# Patient Record
Sex: Male | Born: 1945 | Race: White | Hispanic: No | Marital: Married | Smoking: Former smoker
Health system: Southern US, Community
[De-identification: ages and names within clinical notes are randomized; demographics above are authoritative.]

## PROBLEM LIST (undated history)

## (undated) DIAGNOSIS — Z923 Personal history of irradiation: Secondary | ICD-10-CM

## (undated) DIAGNOSIS — R490 Dysphonia: Secondary | ICD-10-CM

## (undated) DIAGNOSIS — I10 Essential (primary) hypertension: Secondary | ICD-10-CM

## (undated) DIAGNOSIS — F411 Generalized anxiety disorder: Secondary | ICD-10-CM

## (undated) DIAGNOSIS — K2960 Other gastritis without bleeding: Secondary | ICD-10-CM

## (undated) DIAGNOSIS — J4489 Other specified chronic obstructive pulmonary disease: Secondary | ICD-10-CM

## (undated) DIAGNOSIS — C320 Malignant neoplasm of glottis: Secondary | ICD-10-CM

## (undated) DIAGNOSIS — F329 Major depressive disorder, single episode, unspecified: Secondary | ICD-10-CM

## (undated) DIAGNOSIS — K219 Gastro-esophageal reflux disease without esophagitis: Secondary | ICD-10-CM

## (undated) DIAGNOSIS — F3289 Other specified depressive episodes: Secondary | ICD-10-CM

## (undated) DIAGNOSIS — E039 Hypothyroidism, unspecified: Secondary | ICD-10-CM

## (undated) DIAGNOSIS — Z720 Tobacco use: Secondary | ICD-10-CM

## (undated) DIAGNOSIS — J449 Chronic obstructive pulmonary disease, unspecified: Secondary | ICD-10-CM

## (undated) DIAGNOSIS — M810 Age-related osteoporosis without current pathological fracture: Secondary | ICD-10-CM

## (undated) DIAGNOSIS — M1990 Unspecified osteoarthritis, unspecified site: Secondary | ICD-10-CM

## (undated) HISTORY — DX: Hypothyroidism, unspecified: E03.9

## (undated) HISTORY — DX: Age-related osteoporosis without current pathological fracture: M81.0

## (undated) HISTORY — DX: Other specified depressive episodes: F32.89

## (undated) HISTORY — DX: Other specified chronic obstructive pulmonary disease: J44.89

## (undated) HISTORY — PX: TONSILLECTOMY: SUR1361

## (undated) HISTORY — DX: Generalized anxiety disorder: F41.1

## (undated) HISTORY — DX: Major depressive disorder, single episode, unspecified: F32.9

## (undated) HISTORY — PX: SHOULDER SURGERY: SHX246

## (undated) HISTORY — DX: Chronic obstructive pulmonary disease, unspecified: J44.9

---

## 1969-12-01 HISTORY — PX: ABDOMINAL SURGERY: SHX537

## 1999-11-04 ENCOUNTER — Encounter: Payer: Self-pay | Admitting: Family Medicine

## 1999-11-04 ENCOUNTER — Ambulatory Visit (HOSPITAL_COMMUNITY): Admission: RE | Admit: 1999-11-04 | Discharge: 1999-11-04 | Payer: Self-pay | Admitting: Family Medicine

## 2000-05-07 ENCOUNTER — Ambulatory Visit (HOSPITAL_COMMUNITY): Admission: RE | Admit: 2000-05-07 | Discharge: 2000-05-07 | Payer: Self-pay | Admitting: Family Medicine

## 2000-05-07 ENCOUNTER — Encounter: Payer: Self-pay | Admitting: Family Medicine

## 2000-06-10 ENCOUNTER — Encounter: Payer: Self-pay | Admitting: Family Medicine

## 2000-06-10 ENCOUNTER — Ambulatory Visit (HOSPITAL_BASED_OUTPATIENT_CLINIC_OR_DEPARTMENT_OTHER): Admission: RE | Admit: 2000-06-10 | Discharge: 2000-06-10 | Payer: Self-pay | Admitting: *Deleted

## 2000-06-10 ENCOUNTER — Ambulatory Visit (HOSPITAL_COMMUNITY): Admission: RE | Admit: 2000-06-10 | Discharge: 2000-06-10 | Payer: Self-pay | Admitting: Family Medicine

## 2000-07-22 ENCOUNTER — Ambulatory Visit (HOSPITAL_BASED_OUTPATIENT_CLINIC_OR_DEPARTMENT_OTHER): Admission: RE | Admit: 2000-07-22 | Discharge: 2000-07-22 | Payer: Self-pay | Admitting: *Deleted

## 2000-09-10 ENCOUNTER — Ambulatory Visit (HOSPITAL_COMMUNITY): Admission: RE | Admit: 2000-09-10 | Discharge: 2000-09-10 | Payer: Self-pay | Admitting: Family Medicine

## 2000-09-10 ENCOUNTER — Encounter: Payer: Self-pay | Admitting: Family Medicine

## 2002-06-06 ENCOUNTER — Encounter: Payer: Self-pay | Admitting: Family Medicine

## 2002-06-06 ENCOUNTER — Ambulatory Visit (HOSPITAL_COMMUNITY): Admission: RE | Admit: 2002-06-06 | Discharge: 2002-06-06 | Payer: Self-pay | Admitting: Family Medicine

## 2002-12-28 ENCOUNTER — Encounter: Payer: Self-pay | Admitting: Orthopedic Surgery

## 2002-12-28 ENCOUNTER — Encounter: Admission: RE | Admit: 2002-12-28 | Discharge: 2002-12-28 | Payer: Self-pay | Admitting: Orthopedic Surgery

## 2003-03-20 ENCOUNTER — Ambulatory Visit (HOSPITAL_COMMUNITY): Admission: RE | Admit: 2003-03-20 | Discharge: 2003-03-20 | Payer: Self-pay | Admitting: Family Medicine

## 2003-03-20 ENCOUNTER — Encounter: Payer: Self-pay | Admitting: Family Medicine

## 2003-07-13 ENCOUNTER — Encounter (INDEPENDENT_AMBULATORY_CARE_PROVIDER_SITE_OTHER): Payer: Self-pay | Admitting: Specialist

## 2003-07-13 ENCOUNTER — Ambulatory Visit (HOSPITAL_COMMUNITY): Admission: RE | Admit: 2003-07-13 | Discharge: 2003-07-13 | Payer: Self-pay | Admitting: Gastroenterology

## 2003-08-17 ENCOUNTER — Ambulatory Visit (HOSPITAL_BASED_OUTPATIENT_CLINIC_OR_DEPARTMENT_OTHER): Admission: RE | Admit: 2003-08-17 | Discharge: 2003-08-17 | Payer: Self-pay | Admitting: Surgery

## 2003-08-17 ENCOUNTER — Encounter (INDEPENDENT_AMBULATORY_CARE_PROVIDER_SITE_OTHER): Payer: Self-pay | Admitting: *Deleted

## 2005-01-27 ENCOUNTER — Ambulatory Visit: Payer: Self-pay | Admitting: Critical Care Medicine

## 2005-02-28 ENCOUNTER — Encounter: Admission: RE | Admit: 2005-02-28 | Discharge: 2005-02-28 | Payer: Self-pay | Admitting: Rheumatology

## 2005-03-20 ENCOUNTER — Ambulatory Visit: Payer: Self-pay | Admitting: Critical Care Medicine

## 2005-03-21 ENCOUNTER — Ambulatory Visit: Payer: Self-pay | Admitting: Critical Care Medicine

## 2005-03-21 ENCOUNTER — Encounter (INDEPENDENT_AMBULATORY_CARE_PROVIDER_SITE_OTHER): Payer: Self-pay | Admitting: Specialist

## 2005-03-21 ENCOUNTER — Ambulatory Visit: Admission: RE | Admit: 2005-03-21 | Discharge: 2005-03-21 | Payer: Self-pay | Admitting: Critical Care Medicine

## 2005-03-21 ENCOUNTER — Encounter (INDEPENDENT_AMBULATORY_CARE_PROVIDER_SITE_OTHER): Payer: Self-pay | Admitting: *Deleted

## 2005-03-28 ENCOUNTER — Ambulatory Visit (HOSPITAL_COMMUNITY): Admission: RE | Admit: 2005-03-28 | Discharge: 2005-03-28 | Payer: Self-pay | Admitting: Critical Care Medicine

## 2005-03-28 ENCOUNTER — Ambulatory Visit: Payer: Self-pay | Admitting: Critical Care Medicine

## 2005-10-07 ENCOUNTER — Ambulatory Visit: Payer: Self-pay | Admitting: Critical Care Medicine

## 2005-11-10 ENCOUNTER — Emergency Department (HOSPITAL_COMMUNITY): Admission: EM | Admit: 2005-11-10 | Discharge: 2005-11-10 | Payer: Self-pay | Admitting: Emergency Medicine

## 2005-11-14 ENCOUNTER — Ambulatory Visit: Payer: Self-pay | Admitting: Critical Care Medicine

## 2005-11-20 ENCOUNTER — Ambulatory Visit: Payer: Self-pay | Admitting: Pulmonary Disease

## 2005-12-05 ENCOUNTER — Ambulatory Visit: Payer: Self-pay | Admitting: Critical Care Medicine

## 2005-12-09 ENCOUNTER — Ambulatory Visit: Payer: Self-pay | Admitting: Cardiology

## 2006-01-08 ENCOUNTER — Ambulatory Visit: Payer: Self-pay | Admitting: Critical Care Medicine

## 2006-01-26 ENCOUNTER — Observation Stay (HOSPITAL_COMMUNITY): Admission: EM | Admit: 2006-01-26 | Discharge: 2006-01-27 | Payer: Self-pay | Admitting: Emergency Medicine

## 2006-04-01 ENCOUNTER — Ambulatory Visit: Payer: Self-pay | Admitting: Critical Care Medicine

## 2006-07-07 ENCOUNTER — Ambulatory Visit: Payer: Self-pay | Admitting: Critical Care Medicine

## 2006-08-07 ENCOUNTER — Ambulatory Visit: Payer: Self-pay | Admitting: Internal Medicine

## 2006-08-27 ENCOUNTER — Ambulatory Visit: Payer: Self-pay | Admitting: Critical Care Medicine

## 2006-09-10 ENCOUNTER — Ambulatory Visit: Payer: Self-pay | Admitting: Critical Care Medicine

## 2006-10-08 ENCOUNTER — Ambulatory Visit: Payer: Self-pay | Admitting: Critical Care Medicine

## 2007-01-28 ENCOUNTER — Ambulatory Visit: Payer: Self-pay | Admitting: Critical Care Medicine

## 2007-09-09 ENCOUNTER — Ambulatory Visit: Payer: Self-pay | Admitting: Critical Care Medicine

## 2007-09-16 DIAGNOSIS — J440 Chronic obstructive pulmonary disease with acute lower respiratory infection: Secondary | ICD-10-CM

## 2007-09-16 DIAGNOSIS — F411 Generalized anxiety disorder: Secondary | ICD-10-CM | POA: Insufficient documentation

## 2007-09-16 DIAGNOSIS — F329 Major depressive disorder, single episode, unspecified: Secondary | ICD-10-CM

## 2007-09-16 DIAGNOSIS — R911 Solitary pulmonary nodule: Secondary | ICD-10-CM | POA: Insufficient documentation

## 2007-09-16 DIAGNOSIS — E039 Hypothyroidism, unspecified: Secondary | ICD-10-CM

## 2007-09-16 DIAGNOSIS — F3289 Other specified depressive episodes: Secondary | ICD-10-CM | POA: Insufficient documentation

## 2008-02-16 ENCOUNTER — Ambulatory Visit: Payer: Self-pay | Admitting: Critical Care Medicine

## 2008-04-18 ENCOUNTER — Encounter: Payer: Self-pay | Admitting: Critical Care Medicine

## 2008-08-17 ENCOUNTER — Ambulatory Visit: Payer: Self-pay | Admitting: Critical Care Medicine

## 2008-10-17 ENCOUNTER — Encounter: Payer: Self-pay | Admitting: Critical Care Medicine

## 2008-12-26 ENCOUNTER — Ambulatory Visit: Payer: Self-pay | Admitting: Critical Care Medicine

## 2008-12-27 ENCOUNTER — Telehealth (INDEPENDENT_AMBULATORY_CARE_PROVIDER_SITE_OTHER): Payer: Self-pay | Admitting: *Deleted

## 2008-12-28 ENCOUNTER — Encounter: Payer: Self-pay | Admitting: Critical Care Medicine

## 2009-01-02 ENCOUNTER — Ambulatory Visit: Payer: Self-pay | Admitting: Critical Care Medicine

## 2009-01-04 ENCOUNTER — Encounter: Payer: Self-pay | Admitting: Critical Care Medicine

## 2009-01-23 ENCOUNTER — Encounter: Payer: Self-pay | Admitting: Critical Care Medicine

## 2009-07-17 ENCOUNTER — Ambulatory Visit: Payer: Self-pay | Admitting: Critical Care Medicine

## 2009-10-01 ENCOUNTER — Ambulatory Visit: Payer: Self-pay | Admitting: Critical Care Medicine

## 2009-11-07 ENCOUNTER — Ambulatory Visit: Payer: Self-pay | Admitting: Critical Care Medicine

## 2010-09-12 ENCOUNTER — Ambulatory Visit: Payer: Self-pay | Admitting: Diagnostic Radiology

## 2010-09-12 ENCOUNTER — Ambulatory Visit: Payer: Self-pay | Admitting: Critical Care Medicine

## 2010-09-12 ENCOUNTER — Ambulatory Visit (HOSPITAL_BASED_OUTPATIENT_CLINIC_OR_DEPARTMENT_OTHER)
Admission: RE | Admit: 2010-09-12 | Discharge: 2010-09-12 | Payer: Self-pay | Source: Home / Self Care | Admitting: Critical Care Medicine

## 2010-10-22 ENCOUNTER — Ambulatory Visit: Payer: Self-pay | Admitting: Critical Care Medicine

## 2010-10-22 DIAGNOSIS — J309 Allergic rhinitis, unspecified: Secondary | ICD-10-CM | POA: Insufficient documentation

## 2010-12-03 ENCOUNTER — Ambulatory Visit
Admission: RE | Admit: 2010-12-03 | Discharge: 2010-12-03 | Payer: Self-pay | Source: Home / Self Care | Attending: Critical Care Medicine | Admitting: Critical Care Medicine

## 2010-12-10 ENCOUNTER — Telehealth (INDEPENDENT_AMBULATORY_CARE_PROVIDER_SITE_OTHER): Payer: Self-pay | Admitting: *Deleted

## 2010-12-31 NOTE — Assessment & Plan Note (Signed)
Summary: Pulmonary OV   Copy to:  Candace smith Primary Provider/Referring Provider:  Dr. Merri Brunette  CC:  Acute Visit.  difficulty breathing "all the time" but worse when doing activity and after laying down.  Sxs getting progressively worse all summer.  Also having left side chest tightness and achy pain and left side back pain x 1 month. .  History of Present Illness: 64yo WM follow-up of bronchitis and COPD.  Moderate copd/AB  9/17  last ov 3/09 Since last OV dyspnea is the same.  Some wheezing.  No mucous.  No chest pain. Overall is at baseline.   December 26, 2008-- The pt notes more dyspnea the  last few months.  There is   no cough,  there is some chest tightness. Left side feels tight.  No mucous production.  No f/c/s.  No change in weight and no edema.  July 17, 2009--  Dyspnea is improved.  Can walk any distance.  Not using ventolin inhaler.  Unsure if symbicort or spiriva have helped. PFTs were normal.  October 01, 2009--Presents for work in visit. Complains of chest congestion, dry cough, night sweats x5days. OTC not working. Denies chest pain, dyspnea, orthopnea, hemoptysis, fever, n/v/d, edema, headache,recent travel or antibiotics. Increased use of albuterol.   November 07, 2009 9:44 AM Had URI 11/1.  The pt saw the NP but did not fill the zpak and pt improved on his own. No cough.  Dypsne is at baseline.  No chest pain.   Pt denies any significant sore throat, nasal congestion or excess secretions, fever, chills, sweats, unintended weight loss, pleurtic or exertional chest pain, orthopnea PND, or leg swelling Pt denies any increase in rescue therapy over baseline, denies waking up needing it or having any early am or nocturnal exacerbations of coughing/wheezing/or dyspnea. There are not any alleviating or precipitating factors noted.  The symptoms do not generally fluctuate. September 12, 2010 9:00 AM Noting more dyspneic.  Notes more discomfort in chest and back area.   Notes pain in back area.  No real cough.  Notes some wheezing. Not much pndrip.  Noting changes over the summer time.  Current Medications (verified): 1)  Levothyroxine Sodium 150 Mcg Tabs (Levothyroxine Sodium) .... Take 1 Tablet By Mouth Once A Day 2)  Lexapro 20 Mg Tabs (Escitalopram Oxalate) .... Take 1 Tablet By Mouth Once A Day 3)  Hydrocodone-Acetaminophen 5-325 Mg  Tabs (Hydrocodone-Acetaminophen) .[name] Kitchen.. 1 By Mouth Four Times Daily 4)  Ventolin Hfa 108 (90 Base) Mcg/act Aers (Albuterol Sulfate) .... Inhale 2 Puffs Every Four Hours As Needed  Allergies (verified): 1)  ! Bidex (Guaifenesin)  Past History:  Past medical, surgical, family and social histories (including risk factors) reviewed, and no changes noted (except as noted below).  Past Medical History: Reviewed history from 12/26/2008 and no changes required. Anxiety COPD  -FeV1  87% 2008 Depression Hypothyroidism  Past Surgical History: Reviewed history from 07/17/2009 and no changes required. shoulder surgery 6-7 yrs ago abdominal surgery 25 yrs ago  Past Pulmonary History:  Pulmonary History: Pulmonary Function Test  Date: 01/02/2009 Height (in.): 75 Gender: Male  Pre-Spirometry  FVC     Value: 5.56 L/min   Pred: 5.26 L/min     % Pred: 106 % FEV1     Value: 3.77 L     Pred: 3.59 L     % Pred: 105 % FEV1/FVC   Value: 68 %     Pred: 68 %  FEF 25-75   Value: 2.16 L/min   Pred: 3.16 L/min     % Pred: 68 %  Post-Spirometry  FVC     Value: 5.42 L/min   Pred: 5.26 L/min     % Pred: 103 % FEV1     Value: 3.91 L     Pred: 3.59 L     % Pred: 109 % FEV1/FVC   Value: 72 %     Pred: 68 %     FEF 25-75   Value: 2.83 L/min   Pred: 3.16 L/min     % Pred: 90 %  Lung Volumes  TLC     Value: 8.91 L   % Pred: 114 % RV     Value: 3.36 L   % Pred: 121 % DLCO     Value: 26.6 %   % Pred: 99 % DLCO/VA   Value: 3.86 %   % Pred: 105 %  Family History: Reviewed history from 07/17/2009 and no changes  required. cancer-father-prostate, lung allergies-sisters asthma-self heart disease- grandfther  Social History: Reviewed history from 02/16/2008 and no changes required. Patient states former smoker.  Quit in 2003.  2 ppd x 43 yrs.  Review of Systems       The patient complains of shortness of breath with activity.  The patient denies shortness of breath at rest, productive cough, non-productive cough, coughing up blood, chest pain, irregular heartbeats, acid heartburn, indigestion, loss of appetite, weight change, abdominal pain, difficulty swallowing, sore throat, tooth/dental problems, headaches, nasal congestion/difficulty breathing through nose, sneezing, itching, ear ache, anxiety, depression, hand/feet swelling, joint stiffness or pain, rash, change in color of mucus, and fever.    Vital Signs:  Patient profile:   65 year old male Height:      75 inches Weight:      207 pounds BMI:     25.97 O2 Sat:      99 % on Room air Temp:     98.1 degrees F oral Pulse rate:   69 / minute BP sitting:   140 / 88  (left arm) Cuff size:   regular  Vitals Entered By: Gweneth Dimitri RN (September 12, 2010 8:49 AM)  Nutrition Counseling: Patient's BMI is greater than 25 and therefore counseled on weight management options.  O2 Flow:  Room air CC: Acute Visit.  difficulty breathing "all the time" but worse when doing activity and after laying down.  Sxs getting progressively worse all summer.  Also having left side chest tightness and achy pain and left side back pain x 1 month.   Does patient need assistance? Functional Status Cook/clean Comments Medications reviewed with patient Daytime contact number verified with patient. Crystal Jones RN  September 12, 2010 8:49 AM    Physical Exam  Additional Exam:  Gen: WD WN    WM      in NAD    NCAT Heent:  no jvd, no TMG, no cervical LNademopathy, orophyx clear,  nares with clear watery drainage. Cor: RRR nl s1/s2  no s3/s4  no m r h g Abd: soft  NT BSA   no masses  No HSM  no rebound or guarding Ext perfused with no c v e v.d Neuro: intact, moves all 4s, CN II-XII intact, DTRs intact Chest:distant BS Skin: clear  Genital/Rectal :deferred    CXR  Procedure date:  09/12/2010  Findings:      IMPRESSION: Hyperinflation consistent with COPD and/or asthma.  Stable noncalcified nodule posteriorly in  the left lower lobe dating back to the January, 2007 CT.  Calcified granuloma in the right middle lobe.  No acute cardiopulmonary disease.    Pulmonary Function Test Date: 09/12/2010 Gender: Male  Pre-Spirometry FVC    Value: 4.97 L/min   Pred: 5.21 L/min     % Pred: 95 % FEV1    Value: 3.33 L     Pred: 3.53 L     % Pred: 94 % FEV1/FVC  Value: 67 %     Pred: 99 %    FEF 25-75  Value: 2.43 L/min   Pred: 3.07 L/min     % Pred: 79 %  Comments: Mild obstruction with reduced FeV1/FVC ratio and FEF 25-75  Impression & Recommendations:  Problem # 1:  COPD (ICD-496) Assessment Deteriorated acute tracheobronchitis Note cxr neg plan Prednisone 10mg  4 each am x3days, 3 x 3days, 2 x 3days, 1 x 3days then stop Start Advair 115/21 two puff two times a day  Doxycycline one twice daily for 7days Pneumovax today Return one month [facility]  Medications Added to Medication List This Visit: 1)  Prednisone 10 Mg Tabs (Prednisone) .... Take as directed 4 each am x3days, 3 x 3days, 2 x 3days, 1 x 3days then stop 2)  Advair Hfa 115-21 Mcg/act Aero (Fluticasone-salmeterol) .... Two puffs twice daily 3)  Doxycycline Monohydrate 100 Mg Caps (Doxycycline monohydrate) .... By mouth twice daily  Complete Medication List: 1)  Levothyroxine Sodium 150 Mcg Tabs (Levothyroxine sodium) .... Take 1 tablet by mouth once a day 2)  Lexapro 20 Mg Tabs (Escitalopram oxalate) .... Take 1 tablet by mouth once a day 3)  Hydrocodone-acetaminophen 5-325 Mg Tabs (Hydrocodone-acetaminophen) .[name] Kitchen.. 1 by mouth four times daily 4)  Ventolin Hfa 108 (90 Base)  Mcg/act Aers (Albuterol sulfate) .... Inhale 2 puffs every four hours as needed 5)  Prednisone 10 Mg Tabs (Prednisone) .... Take as directed 4 each am x3days, 3 x 3days, 2 x 3days, 1 x 3days then stop 6)  Advair Hfa 115-21 Mcg/act Aero (Fluticasone-salmeterol) .... Two puffs twice daily 7)  Doxycycline Monohydrate 100 Mg Caps (Doxycycline monohydrate) .... By mouth twice daily  Other Orders: Spirometry w/Graph (94010) T-2 View CXR (71020TC) Est. Patient Level IV (60454) HFA Instruction [phone]) Prescription Created Electronically [phone]) Pneumococcal Vaccine (29562) Admin 1st Vaccine (13086)  Patient Instructions: 1)  Prednisone 10mg  4 each am x3days, 3 x 3days, 2 x 3days, 1 x 3days then stop 2)  Start Advair 115/21 two puff two times a day  3)  Doxycycline one twice daily for 7days 4)  Pneumovax today 5)  Return one month [facility]  Prescriptions: DOXYCYCLINE MONOHYDRATE 100 MG  CAPS (DOXYCYCLINE MONOHYDRATE) By mouth twice daily  #14 x 0   Entered and Authorized by:   Storm Frisk MD   Signed by:   Storm Frisk MD on 09/12/2010   Method used:   [facility]        [facility]  [address] [id number]       [address]       [place], [place]  [address]       Ph: [phone] or [phone]       Fax: [phone]   RxID:   [id number] ADVAIR HFA 115-21 MCG/ACT  AERO (FLUTICASONE-SALMETEROL) Two puffs twice daily  #1 x 6   Entered and Authorized by:   Storm Frisk MD   Signed by:   Luisa Hart  Vivi Martens MD on 09/12/2010   Method used:   [facility]        [facility]  [address] [id number]       [address]       [place], [place]  [address]       Ph: [phone] or [phone]       Fax: [phone]   RxID:   [id number] PREDNISONE 10 MG  TABS (PREDNISONE) Take as directed 4 each am x3days, 3 x 3days, 2 x 3days, 1 x 3days then stop  #30 x 0   Entered and Authorized by:   Storm Frisk MD   Signed by:    Storm Frisk MD on 09/12/2010   Method used:   [facility]        [facility]  [id number]       [address]       [place], [place]  [address]       Ph: [phone] or [phone]       Fax: [phone]   RxID:   [id number]    Immunization History:  Influenza Immunization History:    Influenza:  historical (08/30/2010)  Immunizations Administered:  Pneumonia Vaccine:    Vaccine Type: Pneumovax (Medicare)    Site: right deltoid    Mfr: Merck    Dose: 0.5 ml    Route: IM    Given by: Kandice Hams CMA    Exp. Date: 12/18/2011    Lot #: [id number]    VIS given: 11/05/09 version given September 12, 2010.  Not Administered:    Meningococcal Vaccine not given   Prevention & Chronic Care Immunizations   Influenza vaccine: Historical  (08/30/2010)    Tetanus booster: Not documented    Pneumococcal vaccine: Pneumovax (Medicare)  (09/12/2010)    H. zoster vaccine: Not documented  Colorectal Screening   Hemoccult: Not documented    Colonoscopy: Not documented  Other Screening   PSA: Not documented   Smoking status: quit > 6 months  (11/07/2009)  Lipids   Total Cholesterol: Not documented   LDL: Not documented   LDL Direct: Not documented   HDL: Not documented   Triglycerides: Not documented   Nursing Instructions: Give Pneumovax today

## 2010-12-31 NOTE — Assessment & Plan Note (Signed)
Summary: Pulmonary OV   Copy to:  Candace smith Primary Provider/Referring Provider:  Dr. Merri [facility]  CC:  1 month follow up.  Pt states breathing did get better for a "very shot time" but sxs have starting to get worse again.  Wheezing, chest tightness, and increased SOB with exertion.  Denies cough..  History of Present Illness: 64yo WM follow-up of bronchitis and COPD.  Moderate copd/AB  September 12, 2010 9:00 AM Noting more dyspneic.  Notes more discomfort in chest and back area.  Notes pain in back area.  No real cough.  Notes some wheezing. Not much pndrip.  Noting changes over the summer time.  October 22, 2010 3:47 PM Pt notes symptoms better then worse again. pred helped but advair not holding the patient No mucus.  Issue is more dyspnea.  Still with chest pain, this went away and has returned Notes some wheezing.   Current Medications (verified): 1)  Levothyroxine Sodium 150 Mcg Tabs (Levothyroxine Sodium) .... Take 1 Tablet By Mouth Once A Day 2)  Lexapro 20 Mg Tabs (Escitalopram Oxalate) .... Take 1 Tablet By Mouth Once A Day 3)  Hydrocodone-Acetaminophen 5-325 Mg  Tabs (Hydrocodone-Acetaminophen) .[name] Kitchen.. 1 By Mouth Four Times Daily 4)  Ventolin Hfa 108 (90 Base) Mcg/act Aers (Albuterol Sulfate) .... Inhale 2 Puffs Every Four Hours As Needed 5)  Advair Hfa 115-21 Mcg/act  Aero (Fluticasone-Salmeterol) .... Two Puffs Twice Daily  Allergies (verified): 1)  ! Bidex (Guaifenesin)  Past History:  Past medical, surgical, family and social histories (including risk factors) reviewed, and no changes noted (except as noted below).  Past Medical History: Reviewed history from 12/26/2008 and no changes required. Anxiety COPD  -FeV1  87% 2008 Depression Hypothyroidism  Past Surgical History: Reviewed history from 07/17/2009 and no changes required. shoulder surgery 6-7 yrs ago abdominal surgery 25 yrs ago  Past Pulmonary History:  Pulmonary History: Pulmonary  Function Test  Date: 01/02/2009 Height (in.): 75 Gender: Male  Pre-Spirometry  FVC     Value: 5.56 L/min   Pred: 5.26 L/min     % Pred: 106 % FEV1     Value: 3.77 L     Pred: 3.59 L     % Pred: 105 % FEV1/FVC   Value: 68 %     Pred: 68 %     FEF 25-75   Value: 2.16 L/min   Pred: 3.16 L/min     % Pred: 68 %  Post-Spirometry  FVC     Value: 5.42 L/min   Pred: 5.26 L/min     % Pred: 103 % FEV1     Value: 3.91 L     Pred: 3.59 L     % Pred: 109 % FEV1/FVC   Value: 72 %     Pred: 68 %     FEF 25-75   Value: 2.83 L/min   Pred: 3.16 L/min     % Pred: 90 %  Lung Volumes  TLC     Value: 8.91 L   % Pred: 114 % RV     Value: 3.36 L   % Pred: 121 % DLCO     Value: 26.6 %   % Pred: 99 % DLCO/VA   Value: 3.86 %   % Pred: 105 %  Family History: Reviewed history from 07/17/2009 and no changes required. cancer-father-prostate, lung allergies-sisters asthma-self heart disease- grandfther  Social History: Reviewed history from 09/12/2010 and no changes required. Patient states former smoker.  Quit in 2003.  2 ppd x 43 yrs.  Review of Systems       The patient complains of shortness of breath with activity, shortness of breath at rest, productive cough, and non-productive cough.  The patient denies coughing up blood, chest pain, irregular heartbeats, acid heartburn, indigestion, loss of appetite, weight change, abdominal pain, difficulty swallowing, sore throat, tooth/dental problems, headaches, nasal congestion/difficulty breathing through nose, sneezing, itching, ear ache, anxiety, depression, hand/feet swelling, joint stiffness or pain, rash, change in color of mucus, and fever.    Vital Signs:  Patient profile:   65 year old male Height:      75 inches Weight:      213 pounds BMI:     26.72 O2 Sat:      97 % on Room air Temp:     98.0 degrees F oral Pulse rate:   71 / minute BP sitting:   138 / 86  (right arm) Cuff size:   regular  Vitals Entered By: Gweneth Dimitri RN (October 22, 2010 3:34 PM)  O2 Flow:  Room air CC: 1 month follow up.  Pt states breathing did get better for a "very shot time" but sxs have starting to get worse again.  Wheezing, chest tightness, increased SOB with exertion.  Denies cough. Comments Medications reviewed with patient Daytime contact number verified with patient. Gweneth Dimitri RN  October 22, 2010 3:33 PM    Physical Exam  Additional Exam:  Gen: WD WN    WM      in NAD    NCAT Heent:  no jvd, no TMG, no cervical LNademopathy, orophyx clear,  nares with clear watery drainage. Cor: RRR nl s1/s2  no s3/s4  no m r h g Abd: soft NT BSA   no masses  No HSM  no rebound or guarding Ext perfused with no c v e v.d Neuro: intact, moves all 4s, CN II-XII intact, DTRs intact Chest:distant BS, exp wheezes Skin: clear  Genital/Rectal :deferred    Impression & Recommendations:  Problem # 1:  COPD (ICD-496) Assessment Deteriorated copd with recurrent exacerbations HFA technique excellent plan cont hfa advair repulse pred ?atopic feature >>>check RAST IgE assay  Medications Added to Medication List This Visit: 1)  Prednisone 10 Mg Tabs (Prednisone) .... Take as directed 4 each am x3days, 3 x 3days, 2 x 3days, 1 x 3days then stop  Complete Medication List: 1)  Levothyroxine Sodium 150 Mcg Tabs (Levothyroxine sodium) .... Take 1 tablet by mouth once a day 2)  Lexapro 20 Mg Tabs (Escitalopram oxalate) .... Take 1 tablet by mouth once a day 3)  Hydrocodone-acetaminophen 5-325 Mg Tabs (Hydrocodone-acetaminophen) .[name] Kitchen.. 1 by mouth four times daily 4)  Ventolin Hfa 108 (90 Base) Mcg/act Aers (Albuterol sulfate) .... Inhale 2 puffs every four hours as needed 5)  Advair Hfa 115-21 Mcg/act Aero (Fluticasone-salmeterol) .... Two puffs twice daily 6)  Prednisone 10 Mg Tabs (Prednisone) .... Take as directed 4 each am x3days, 3 x 3days, 2 x 3days, 1 x 3days then stop  Other Orders: Est. Patient Level IV ([address]) T-"RAST" (Allergy Full Profile) IGE  (60454-[address]11)  Patient Instructions: 1)  Re pulse prednisone 10mg  4 each am x3days, 3 x 3days, 2 x 3days, 1 x 3days then stop 2)  Stay on Advair twice daily 3)  Labs today, I will call with results 4)  Return one month Prescriptions: PREDNISONE 10 MG  TABS (PREDNISONE) Take as directed 4 each am x3days, 3 x 3days, 2 x  3days, 1 x 3days then stop  #30 x 0   Entered and Authorized by:   Storm Frisk MD   Signed by:   Storm Frisk MD on 10/22/2010   Method used:   [facility]        [facility]  [id number]       [address]       [place], [place]  [address]       Ph: [phone] or [phone]       Fax: [phone]   RxID:   [id number]     Appended Document: Pulmonary OV fax [facility] Katrinka Bl[facility]

## 2011-01-02 NOTE — Progress Notes (Signed)
Summary: medication  Phone Note Call from Patient Call back at Home Phone [phone]   Caller: spouse/[name] Call For: Dr. Delford Field Summary of Call: Patients wife [name] phoned [name] was given Doxycyline last week and it makes him sick/nausea and he has not been taking it. They would like to know if [facility] [address] They can be reached at [phone] Initial call taken by: Vedia Coffer,  December 10, 2010 8:56 AM  Follow-up for Phone Call        Spoke with pt.  He was started on doxy 100 mg two times a day at last ov on 12/03/09- he states that he is unable to take med, tried taking this x 4 days and each day was very nauseated.  I asked if he took with meals and he states he did not, but does not think it would have made a difference.  He is still wheezing and has prod cough with green sputum.  He would like another abx called in since could not tolerate doxy. Pls advise thanks Follow-up by: Vernie Murders,  December 10, 2010 9:15 AM  Additional Follow-up for Phone Call Additional follow up Details #1::        rx avelox 400mg  /d x 5days Additional Follow-up by: Storm Frisk MD,  December 10, 2010 9:23 AM    Additional Follow-up for Phone Call Additional follow up Details #2::    Avelox sent to pharm.  Spoke with pt and notified. Follow-up by: Vernie Murders,  December 10, 2010 9:35 AM  New/Updated Medications: AVELOX 400 MG TABS (MOXIFLOXACIN HCL) 1 by mouth once daily until gone Prescriptions: AVELOX 400 MG TABS (MOXIFLOXACIN HCL) 1 by mouth once daily until gone  #5 x 0   Entered by:   Vernie Murders   Authorized by:   Storm Frisk MD   Signed by:   Vernie Murders on 12/10/2010   Method used:   [facility]  [address] [id number]       [address]       [place], [place]  [address]       Ph: [phone] or [phone]       Fax: [phone]   RxID:   [id number]

## 2011-01-02 NOTE — Assessment & Plan Note (Signed)
Summary: Pulmonary OV   Copy to:  Candace smith Primary Provider/Referring Provider:  Dr. Merri Brunette  CC:  1 month follow up.  head and chest congestion, increased difficulty breathing all the time, a lot of chest tightness, some wheezing, and cough x 3 wks but gotten worse x 4-5 days.  Cough is prod at times with greenish yellow mucus..  History of Present Illness: 65yo WM follow-up of bronchitis and COPD.  Moderate copd/AB  September 12, 2010 9:00 AM Noting more dyspneic.  Notes more discomfort in chest and back area.  Notes pain in back area.  No real cough.  Notes some wheezing. Not much pndrip.  Noting changes over the summer time.  October 22, 2010 3:47 PM Pt notes symptoms better then worse again. pred helped but advair not holding the patient No mucus.  Issue is more dyspnea.  Still with chest pain, this went away and has returned Notes some wheezing.   December 03, 2010 4:28 PM Got a cold a few weeks ago and would not go away and now several days of dyspnea. Not coughing up much btu when does is green yellow.  Nose runs.  Dyspnea with exertion Will note some wheezing.  No fever.  No sweats Pred pulse helped in October and November.  Current Medications (verified): 1)  Levothyroxine Sodium 150 Mcg Tabs (Levothyroxine Sodium) .... Take 1 Tablet By Mouth Once A Day 2)  Lexapro 20 Mg Tabs (Escitalopram Oxalate) .... Take 1 Tablet By Mouth Once A Day 3)  Hydrocodone-Acetaminophen 5-325 Mg  Tabs (Hydrocodone-Acetaminophen) .[name] Kitchen.. 1 By Mouth Four Times Daily 4)  Ventolin Hfa 108 (90 Base) Mcg/act Aers (Albuterol Sulfate) .... Inhale 2 Puffs Every Four Hours As Needed 5)  Advair Hfa 115-21 Mcg/act  Aero (Fluticasone-Salmeterol) .... Two Puffs Twice Daily  Allergies (verified): 1)  ! Bidex (Guaifenesin)  Past History:  Past medical, surgical, family and social histories (including risk factors) reviewed, and no changes noted (except as noted below).  Past Medical  History: Reviewed history from 12/26/2008 and no changes required. Anxiety COPD  -FeV1  87% 2008 Depression Hypothyroidism  Past Surgical History: Reviewed history from 07/17/2009 and no changes required. shoulder surgery 6-7 yrs ago abdominal surgery 25 yrs ago  Past Pulmonary History:  Pulmonary History: Pulmonary Function Test  Date: 01/02/2009 Height (in.): 75 Gender: Male  Pre-Spirometry  FVC     Value: 5.56 L/min   Pred: 5.26 L/min     % Pred: 106 % FEV1     Value: 3.77 L     Pred: 3.59 L     % Pred: 105 % FEV1/FVC   Value: 68 %     Pred: 68 %     FEF 25-75   Value: 2.16 L/min   Pred: 3.16 L/min     % Pred: 68 %  Post-Spirometry  FVC     Value: 5.42 L/min   Pred: 5.26 L/min     % Pred: 103 % FEV1     Value: 3.91 L     Pred: 3.59 L     % Pred: 109 % FEV1/FVC   Value: 72 %     Pred: 68 %     FEF 25-75   Value: 2.83 L/min   Pred: 3.16 L/min     % Pred: 90 %  Lung Volumes  TLC     Value: 8.91 L   % Pred: 114 % RV     Value: 3.36 L   %  Pred: 121 % DLCO     Value: 26.6 %   % Pred: 99 % DLCO/VA   Value: 3.86 %   % Pred: 105 %  Family History: Reviewed history from 07/17/2009 and no changes required. cancer-father-prostate, lung allergies-sisters asthma-self heart disease- grandfther  Social History: Reviewed history from 09/12/2010 and no changes required. Patient states former smoker.  Quit in 2003.  2 ppd x 43 yrs.  Review of Systems       The patient complains of shortness of breath with activity and productive cough.  The patient denies shortness of breath at rest, non-productive cough, coughing up blood, chest pain, irregular heartbeats, acid heartburn, indigestion, loss of appetite, weight change, abdominal pain, difficulty swallowing, sore throat, tooth/dental problems, headaches, nasal congestion/difficulty breathing through nose, sneezing, itching, ear ache, anxiety, depression, hand/feet swelling, joint stiffness or pain, rash, change in color of mucus, and  fever.    Vital Signs:  Patient profile:   65 year old male Height:      75 inches Weight:      214.13 pounds BMI:     26.86 O2 Sat:      96 % on Room air Temp:     98.0 degrees F oral Pulse rate:   86 / minute BP sitting:   126 / 80  (right arm) Cuff size:   regular  Vitals Entered By: Gweneth Dimitri RN (December 03, 2010 4:11 PM)  O2 Flow:  Room air CC: 1 month follow up.  head and chest congestion, increased difficulty breathing all the time, a lot of chest tightness, some wheezing, cough x 3 wks but gotten worse x 4-5 days.  Cough is prod at times with greenish yellow mucus. Comments Medications reviewed with patient Daytime contact number verified with patient. Crystal Jones RN  December 03, 2010 4:11 PM    Physical Exam  Additional Exam:  Gen: WD WN    WM      in NAD    NCAT Heent:  no jvd, no TMG, no cervical LNademopathy, orophyx clear,  nares with clear watery drainage. Cor: RRR nl s1/s2  no s3/s4  no m r h g Abd: soft NT BSA   no masses  No HSM  no rebound or guarding Ext perfused with no c v e v.d Neuro: intact, moves all 4s, CN II-XII intact, DTRs intact Chest:distant BS, exp wheezes Skin: clear  Genital/Rectal :deferred    Impression & Recommendations:  Problem # 1:  ACUTE BRONCHITIS (ICD-466.0) Assessment Deteriorated recurrent tracheobronchitis plan add qvar pulse pred cont advair His updated medication list for this problem includes:    Ventolin Hfa 108 (90 Base) Mcg/act Aers (Albuterol sulfate) ..... Inhale 2 puffs every four hours as needed    Advair Hfa 115-21 Mcg/act Aero (Fluticasone-salmeterol) .[name] Kitchen..[name] Kitchen Two puffs twice daily    Doxycycline Monohydrate 100 Mg Caps (Doxycycline monohydrate) ..... By mouth twice daily    Qvar 40 Mcg/act Aers (Beclomethasone dipropionate) .[name] Kitchen..[name] Kitchen Two puffs twice daily  Orders: Est. Patient Level V (14782)  Medications Added to Medication List This Visit: 1)  Prednisone 10 Mg Tabs (Prednisone) .... Take as directed 4  each am x3days, 3 x 3days, 2 x 3days, 1 x 3days then stop 2)  Doxycycline Monohydrate 100 Mg Caps (Doxycycline monohydrate) .... By mouth twice daily 3)  Qvar 40 Mcg/act Aers (Beclomethasone dipropionate) .... Two puffs twice daily  Complete Medication List: 1)  Levothyroxine Sodium 150 Mcg Tabs (Levothyroxine sodium) .... Take 1 tablet  by mouth once a day 2)  Lexapro 20 Mg Tabs (Escitalopram oxalate) .... Take 1 tablet by mouth once a day 3)  Hydrocodone-acetaminophen 5-325 Mg Tabs (Hydrocodone-acetaminophen) .[name] Kitchen.. 1 by mouth four times daily 4)  Ventolin Hfa 108 (90 Base) Mcg/act Aers (Albuterol sulfate) .... Inhale 2 puffs every four hours as needed 5)  Advair Hfa 115-21 Mcg/act Aero (Fluticasone-salmeterol) .... Two puffs twice daily 6)  Prednisone 10 Mg Tabs (Prednisone) .... Take as directed 4 each am x3days, 3 x 3days, 2 x 3days, 1 x 3days then stop 7)  Doxycycline Monohydrate 100 Mg Caps (Doxycycline monohydrate) .... By mouth twice daily 8)  Qvar 40 Mcg/act Aers (Beclomethasone dipropionate) .... Two puffs twice daily  Patient Instructions: 1)  Qvar two puff twice daily until samples gone 2)  Stay on Advair two puff twice daily 3)  Prednisone 10mg  4 each am x3days, 3 x 3days, 2 x 3days, 1 x 3days then stop 4)  Doxycycline one twice daily for 7days 5)  Return 6 weeks Prescriptions: DOXYCYCLINE MONOHYDRATE 100 MG  CAPS (DOXYCYCLINE MONOHYDRATE) By mouth twice daily  #14 x 0   Entered and Authorized by:   Storm Frisk MD   Signed by:   Storm Frisk MD on 12/03/2010   Method used:   Electronically to        [facility]  [address] [id number] (retail)       [address]       [place], [place]  [address]       Ph: [phone] or [phone]       Fax: [phone]   RxID:   [id number] PREDNISONE 10 MG  TABS (PREDNISONE) Take as directed 4 each am x3days, 3 x 3days, 2 x 3days, 1 x 3days then stop  #30 x 6   Entered and Authorized by:   Storm Frisk MD    Signed by:   Storm Frisk MD on 12/03/2010   Method used:   [facility]ronically to        [facility]  [address] [id number] (ret[facility]       [address]       [place], [place]  [address]       Ph: [phone] or [phone]       Fax: [phone]   RxID:   [id number]

## 2011-01-14 ENCOUNTER — Encounter: Payer: Self-pay | Admitting: Critical Care Medicine

## 2011-01-14 ENCOUNTER — Ambulatory Visit (INDEPENDENT_AMBULATORY_CARE_PROVIDER_SITE_OTHER): Payer: Medicare Other | Admitting: Critical Care Medicine

## 2011-01-14 DIAGNOSIS — J449 Chronic obstructive pulmonary disease, unspecified: Secondary | ICD-10-CM

## 2011-01-14 DIAGNOSIS — K219 Gastro-esophageal reflux disease without esophagitis: Secondary | ICD-10-CM | POA: Insufficient documentation

## 2011-01-22 NOTE — Assessment & Plan Note (Signed)
Summary: Pulmonary OV   Copy to:  Candace smith Primary Provider/Referring Provider:  Dr. Merri Brunette  CC:  1 month follow up.  Pt states breathing is about the same - still haivng SOB with exertion, wheezing, and chest tightness.  Denies cough..  History of Present Illness: 64yo WM follow-up of bronchitis and COPD.  Moderate copd/AB  September 12, 2010 9:00 AM Noting more dyspneic.  Notes more discomfort in chest and back area.  Notes pain in back area.  No real cough.  Notes some wheezing. Not much pndrip.  Noting changes over the summer time.  October 22, 2010 3:47 PM Pt notes symptoms better then worse again. pred helped but advair not holding the patient No mucus.  Issue is more dyspnea.  Still with chest pain, this went away and has returned Notes some wheezing.   December 03, 2010 4:28 PM Got a cold a few weeks ago and would not go away and now several days of dyspnea. Not coughing up much btu when does is green yellow.  Nose runs.  Dyspnea with exertion Will note some wheezing.  No fever.  No sweats Pred pulse helped in October and November.  January 14, 2011 2:14 PM Not much change from before.  The bronchitis did clear up.  Pt is still wheezing the same and dyspnea is the same.  No nasal drainage.  Now off qvar and abx.  Pt is better and on advair alone now.  Current Medications (verified): 1)  Levothyroxine Sodium 150 Mcg Tabs (Levothyroxine Sodium) .... Take 1 Tablet By Mouth Once A Day 2)  Lexapro 20 Mg Tabs (Escitalopram Oxalate) .... Take 1 Tablet By Mouth Once A Day 3)  Hydrocodone-Acetaminophen 5-325 Mg  Tabs (Hydrocodone-Acetaminophen) .[name] Kitchen.. 1 By Mouth Four Times Daily 4)  Ventolin Hfa 108 (90 Base) Mcg/act Aers (Albuterol Sulfate) .... Inhale 2 Puffs Every Four Hours As Needed 5)  Advair Hfa 115-21 Mcg/act  Aero (Fluticasone-Salmeterol) .... Two Puffs Twice Daily  Allergies (verified): 1)  ! Bidex (Guaifenesin) 2)  Doxycycline  Past History:  Past  medical, surgical, family and social histories (including risk factors) reviewed, and no changes noted (except as noted below).  Past Medical History: Reviewed history from 12/26/2008 and no changes required. Anxiety COPD  -FeV1  87% 2008 Depression Hypothyroidism  Past Surgical History: Reviewed history from 07/17/2009 and no changes required. shoulder surgery 6-7 yrs ago abdominal surgery 25 yrs ago  Past Pulmonary History:  Pulmonary History: Pulmonary Function Test  Date: 01/02/2009 Height (in.): 75 Gender: Male  Pre-Spirometry  FVC     Value: 5.56 L/min   Pred: 5.26 L/min     % Pred: 106 % FEV1     Value: 3.77 L     Pred: 3.59 L     % Pred: 105 % FEV1/FVC   Value: 68 %     Pred: 68 %     FEF 25-75   Value: 2.16 L/min   Pred: 3.16 L/min     % Pred: 68 %  Post-Spirometry  FVC     Value: 5.42 L/min   Pred: 5.26 L/min     % Pred: 103 % FEV1     Value: 3.91 L     Pred: 3.59 L     % Pred: 109 % FEV1/FVC   Value: 72 %     Pred: 68 %     FEF 25-75   Value: 2.83 L/min   Pred: 3.16 L/min     %  Pred: 90 %  Lung Volumes  TLC     Value: 8.91 L   % Pred: 114 % RV     Value: 3.36 L   % Pred: 121 % DLCO     Value: 26.6 %   % Pred: 99 % DLCO/VA   Value: 3.86 %   % Pred: 105 %  Family History: Reviewed history from 07/17/2009 and no changes required. cancer-father-prostate, lung allergies-sisters asthma-self heart disease- grandfther  Social History: Reviewed history from 09/12/2010 and no changes required. Patient states former smoker.  Quit in 2003.  2 ppd x 43 yrs.  Review of Systems       The patient complains of shortness of breath with activity, non-productive cough, and acid heartburn.  The patient denies shortness of breath at rest, productive cough, coughing up blood, chest pain, irregular heartbeats, indigestion, loss of appetite, weight change, abdominal pain, difficulty swallowing, sore throat, tooth/dental problems, headaches, nasal congestion/difficulty breathing  through nose, sneezing, itching, ear ache, anxiety, depression, hand/feet swelling, joint stiffness or pain, rash, change in color of mucus, and fever.    Vital Signs:  Patient profile:   65 year old male Height:      75 inches Weight:      214.13 pounds BMI:     26.86 O2 Sat:      96 % on Room air Temp:     98.1 degrees F oral Pulse rate:   96 / minute BP sitting:   142 / 82  (right arm) Cuff size:   large  Vitals Entered By: Gweneth Dimitri RN (January 14, 2011 2:09 PM)  O2 Flow:  Room air CC: 1 month follow up.  Pt states breathing is about the same - still haivng SOB with exertion, wheezing, chest tightness.  Denies cough. Comments Medications reviewed with patient Daytime contact number verified with patient. Crystal Jones RN  January 14, 2011 2:09 PM    Physical Exam  Additional Exam:  Gen: WD WN    WM      in NAD    NCAT Heent:  no jvd, no TMG, no cervical LNademopathy, orophyx clear,  nares with clear watery drainage. Cor: RRR nl s1/s2  no s3/s4  no m r h g Abd: soft NT BSA   no masses  No HSM  no rebound or guarding Ext perfused with no c v e v.d Neuro: intact, moves all 4s, CN II-XII intact, DTRs intact Chest:distant BS, exp wheezes Skin: clear  Genital/Rectal :deferred    Impression & Recommendations:  Problem # 1:  COPD (ICD-496) Assessment Unchanged  copd with recurrent exacerbations ppt by GERD, now improved, RAST assay normal  HFA technique excellent plan cont hfa advair  follow gerd diet  Problem # 2:  GERD (ICD-530.81) Assessment: Unchanged cont zantac and reflux diet His updated medication list for this problem includes:    Zantac 150 Mg Tabs (Ranitidine hcl) .[name] Kitchen... Take 1-2 by mouth daily  Orders: Est. Patient Level III (19147)  Medications Added to Medication List This Visit: 1)  Zantac 150 Mg Tabs (Ranitidine hcl) .... Take 1-2 by mouth daily  Complete Medication List: 1)  Levothyroxine Sodium 150 Mcg Tabs (Levothyroxine sodium) ....  Take 1 tablet by mouth once a day 2)  Lexapro 20 Mg Tabs (Escitalopram oxalate) .... Take 1 tablet by mouth once a day 3)  Hydrocodone-acetaminophen 5-325 Mg Tabs (Hydrocodone-acetaminophen) .[name] Kitchen.. 1 by mouth four times daily 4)  Ventolin Hfa 108 (90 Base) Mcg/act Aers (Albuterol sulfate) .[name] KitchenMarland KitchenMarland Kitchen  Inhale 2 puffs every four hours as needed 5)  Advair Hfa 115-21 Mcg/act Aero (Fluticasone-salmeterol) .... Two puffs twice daily 6)  Zantac 150 Mg Tabs (Ranitidine hcl) .... Take 1-2 by mouth daily  Patient Instructions: 1)  Follow reflux diet 2)  No change in medications 3)  Return in     4     months  Appended Document: Pulmonary OV fax candace Katrinka Blazing

## 2011-04-15 NOTE — Assessment & Plan Note (Signed)
[facility]                             PULMONARY OFFICE NOTE   NAME:STONEDenham, Mose                       MRN:          [id number]  DATE:09/09/2007                            DOB:          [birth year]    Mr. Wyndham is a 65 year old white male history of chronic obstructive  lung disease, asthmatic bronchitic components, emphysematous components.  He is here today for followup, doing well on Symbicort 2 sprays b.i.d.  160/4.5 and Spiriva daily.  He is having no active cough, mucus  production, or chest tightness and is doing otherwise well.   EXAM:  Temp 98, blood pressure 130/84, pulse 58, saturation 98% on room  air.  CHEST:  Diminished breath sounds, prolonged inspiratory phase.  No  wheeze, rale, or rhonchi.  CARDIAC EXAM:  Regular rate and rhythm without S3.  Normal S1, S2.  ABDOMEN:  Soft, nontender.  EXTREMITIES:  No edema, clubbing, or venous disease.   IMPRESSION:  Asthmatic bronchitis with chronic obstructive lung disease  stable at this time.   Plan for this patient is to maintain inhaled medicines as currently  dosed, and we will see the patient back in followup.  A flu vaccine was  administered.     Charlcie Cradle Delford Field, MD, [facility]  Electronically Signed    PEW/MedQ  DD: 09/09/2007  DT: 09/09/2007  Job #: [id number]

## 2011-04-18 NOTE — Op Note (Signed)
   NAME:  [name], [name]                          ACCOUNT NO.:  [id number]   MEDICAL RECORD NO.:  [id number]                   PATIENT TYPE:  AMB   LOCATION:  ENDO                                 FACILITY:  [facility]   PHYSICIAN:  Graylin Shiver, M.D.                DATE OF BIRTH:  01/01/1946   DATE OF PROCEDURE:  07/13/2003  DATE OF DISCHARGE:                                 OPERATIVE REPORT   PROCEDURE:  Colonoscopy with biopsy.   INDICATION FOR PROCEDURE:  Lower abdominal pain, diarrhea, rectal bleeding.  The patient gives a remote history of positive for Crohn's disease,  diagnosed at surgery 34 years ago.  He questions that particular diagnosis,  however.  He is undergoing colonoscopy because of the lower abdominal pain,  loose stools, and rectal bleeding.   Informed consent was obtained after explanation of the risks of bleeding,  infection, and perforation.   PREMEDICATIONS:  1. Fentanyl 75 mcg IV.  2. Versed 6 mg IV.   DESCRIPTION OF PROCEDURE:  With the patient in the left lateral decubitus  position, a rectal exam was performed, and no masses were felt.  The Olympus  colonoscope was inserted into the rectum and advanced around a tortuous  colon to the anastomosis from his prior surgery.  The anastomosis site  looked normal with no evidence of ulceration or inflammatory changes  suggesting Crohn's disease.  The scope was passed through the anastomosis  and into the small bowel, and the first few centimeters of small bowel  inspected, looked perfectly normal.  The scope was then brought out,  visualizing the rest of the colonic mucosa which looked normal in its  entirety from the anastomosis down to the rectum.  Some random biopsies were  obtained from the more proximal colon for histological inspection.  The  scope was retroflexed in the rectum.  No abnormalities were seen.  He  tolerated the procedure well without complications.   IMPRESSION:  Normal colonoscopy to the  region of the anastomosis site.  I  see nothing on this exam to suggest Crohn's disease.                                               Graylin Shiver, M.D.    Germain Osgood  D:  07/13/2003  T:  07/13/2003  Job:  [id number]   cc:   Dario Guardian, M.D.  [address]  [place]  [place] [address]  Fax: [phone]

## 2011-04-18 NOTE — Assessment & Plan Note (Signed)
[facility]                               PULMONARY OFFICE NOTE   NAME:[name]                       MRN:          [id number]  DATE:[name]                            DOB:          Apr 05, 1946    [name] returns today in followup.  This is a 65 year old white male,  history of chronic obstructive lung disease, asthmatic bronchitis.  Overall  the patient is improved, with decreased shortness of breath, decreased  cough.  He had an acute bronchitic spell in October, but now is better.   He maintains:  1. Symbicort 2 sprays b.i.d. 160/4.5.  2. Spiriva daily.   PHYSICAL EXAMINATION:  He is a well-developed, well-nourished, middle-aged  male in no distress.  Temp 98, blood pressure 130/84, pulse 80, saturation 98% on room air.  CHEST:  Showed diminished breath sounds with prolonged expiratory phase, no  wheeze or rhonchi noted.  CARDIAC:  Exam showed a regular rate and rhythm without S3, normal S1, S2.  ABDOMEN:  Soft, nontender.  EXTREMITIES:  Showed no edema or clubbing.  SKIN:  Clear.  NEUROLOGIC:  Exam was intact.  HEENT:  Exam showed no jugular venous distention, no lymphadenopathy.  The  oropharynx is clear.  NECK:  Supple.   IMPRESSION:  Chronic obstructive lung disease with asthmatic bronchitic  component.   PLAN:  Maintain inhaled medicines as currently dosed.  We will see the  patient back in return followup in 3 months.     Charlcie Cradle Delford Field, MD, [facility]  Electronically Signed    [name]  [name]  [name]  Job #: [id number]   cc:   Dario Guardian, M.D.

## 2011-04-18 NOTE — Op Note (Signed)
[facility]  Patient:    [name], [name]                       MRN: [id number] Proc. Date: 06/10/00 Adm. Date:  [id number] Disc. Date: [id number] Attending:  Melody Comas.                           Operative Report  PREOPERATIVE DIAGNOSIS:  Subcutaneous abscess left medial cheek.  POSTOPERATIVE DIAGNOSIS:  Subcutaneous abscess left medial cheek.  OPERATION:  Incision and drainage of infected epidermal inclusion cyst, left medial cheek.  SURGEON:  Janet Berlin. Dan Humphreys, M.D.  ASSISTANT:  None.  ANESTHESIA:  Local.  INDICATIONS:  [name] is a 65 year old man who was seen electively in the office and scheduled for removal of what was felt to be an epidermal inclusion cyst of the left medial cheek.  When I examined him just prior to the proposed procedure today, he had an obvious abscess at the time.  I explained to the patient it required immediate incision and drainage and that an elective resection could not safely be carried out under these circumstances. He is in agreement with I&D of the lesion.  We will plan on bringing him back electively to excise any cyst remnants once this area has healed.  DESCRIPTION OF OPERATION:  The patient was placed in the supine position on the operating table at the [facility].  I sterilely prepped the area with Hibiclens solution and used sterile towels to form a surgical field.  The lesion was very tense.  I made a small nick in the thinned out central portion of the lesion.  Purulent material was immediately expressed. This was cultured both aerobically and anaerobically.  I then infiltrated 1% Xylocaine into the soft tissues immediately surrounding the area, warning the patient that the level of anesthesia would be suboptimal due to the change in pH of the tissues from the acute inflammation.  However, we were able to get him at a reasonable level of anesthetic blockage.  I then extended  the incision along the long axis of the abscess, which roughly paralleled the nasolabial fold.  Additionally purulent exudate was allowed to escape.  I also excised some of the fibrous septa within the lesion.  Additionally, I was able to remove a lot of the ceruminous material contained within the cyst.  After widely opening it, I packed the area with iodoform gauze and placed a sterile dressing over it.  The patient is to leave the dressing in place overnight and will see me in the office early tomorrow.  At that time, the packing will be be removed and he will be started on daily dressing changes.  He was also given a prescription for oral antibiotics, as well as for an analgesic for pain relief. DD:  06/15/00 TD:  06/15/00 Job: [id number]

## 2011-04-18 NOTE — Op Note (Signed)
[place]  Patient:    [name], [name]                       MRN: [id number] Proc. Date: 07/22/00 Adm. Date:  [id number] Attending:  Melody Comas.                           Operative Report  PREOPERATIVE DIAGNOSIS:  Probable epidermal inclusion cyst, left medial cheek.  POSTOPERATIVE DIAGNOSIS:  Probable epidermal inclusion cyst, left medial cheek.  PROCEDURE PERFORMED:  Excisional biopsy of 1.5 cm subcutaneous mass of the left medial cheek.  SURGEON:  Janet Berlin. Dan Humphreys, M.D.  ANESTHESIA:  Local.  INDICATIONS:  [name] is a 65 year old man who was previously scheduled to undergo removal of a subcutaneous mass of the left medial cheek.  He presented at the time of the excision with a markedly inflamed, fluctuant mass, consistent with an acutely infected epidermal inclusion cyst.  An I&D procedure was carried out at that time, and the patient was treated with dressing changes and oral antibiotics.  He has gone on to resolve this infection, and now represents for excision of what is now a much smaller mass, but still grossly visible to casual inspection.  DESCRIPTION OF PROCEDURE:  The patient was brought back to the minor surgery operating room at the [facility], and placed on the operating room table in the supine position.  I prepped the left medial cheek with Hibiclens solution and sterilely draped the field.  Xylocaine 1% was used to infiltrate the soft tissues around the mass.  I designed a lenticular shaped excision as a continuation of the nasolabial fold on the left.  After a suitable level of anesthesia had been obtained, the lesion was excised full-thickness.  I found that I had to dissect quite deeply, medially, and laterally in order to get around the lesion.  There is a lot of chronically inflamed tissue as well.  A branch of what was probably the angularis artery was transected during the excision, and I  controlled this with small hemostats.  This was suture ligated for hemostatic control.  The wound was irrigated out, and hemostasis found to be satisfactory.  I closed the defect in layers.  Sutures were first placed at the base of the wound in order to reapproximate the muscular layers.  I then placed sutures in the subcutaneous layer; all of these were interrupted, buried 4-0 Vicryl sutures.  I then placed buried dermal sutures using the same material to reapproximated the wound margins.  Final epidermal layer closure was achieved with placement of both vertical mattress and simple running sutures of 6-0 monofilament nylon. The wound was sterilely dressed.  The patient was given wound care instructions, and will see me early next week for wound check, suture removal, and review of his final pathology report. DD:  07/22/00 TD:  07/23/00 Job: [id number]

## 2011-04-18 NOTE — Op Note (Signed)
NAME:  [name], [name]                          ACCOUNT NO.:  [id number]   MEDICAL RECORD NO.:  [id number]                   PATIENT TYPE:  AMB   LOCATION:  DSC                                  FACILITY:  [facility]   PHYSICIAN:  Currie Paris, M.D.           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   OPERATION PERFORMED:  Wound exploration of abdominal incision with excision  of scar.   SURGEON:  Currie Paris, M.D.   ANESTHESIA:  MAC.   INDICATIONS FOR PROCEDURE:  The patient has had an abdominal surgery back in  the 70s and has had a chronic area of drainage in the lower midline wound  that will occasionally stop and then redrain.  He has also in the past had  suture material work it's way out through the wound in various locations,  but none for several years.  I had suspected that there may be some residual  foreign body suture material causing this chronic inflammation.   DESCRIPTION OF PROCEDURE:  The patient was seen in the holding area and had  no further questions.  The area in question was marked although it was quite  visible as a reddened area with dried skin and crusting in the lower midline  incision.  The patient was taken to the operating room and given some IV  sedation. The area was shaved, prepped and draped.  I used a combination of  1% Xylocaine and 0.5% Marcaine with epinephrine mixed equally for local.  I  infiltrated the area and then excised with an elliptical incision, the area  of inflammatory changes of the skin and sent that for pathology.  I divided  the subcutaneous tissues down to fascia and carefully examined all of this  both visually and by palpation.  There was no evidence of any deep  wound  problem and the fascia appeared intact and I saw no suture material in the  fascia and no evidence of any inflammatory processes deeper than the  superficial subcutaneous tissue which I had already excised.  I felt fairly  comfortable there was no chronic foreign  body or chronic local infection deep and that this was most likely a  superficial issue.  The incision was then closed with some 3-0 nylon  vertical mattress interrupted sutures.  Sterile dressings were applied.  The  patient tolerated the procedure well.  There were no operative complications  and all counts were correct.  Currie Paris, M.D.    CJS/MEDQ  D:  08/17/2003  T:  08/17/2003  Job:  [id number]   cc:   Dario Guardian, M.D.  [address]  [place]  [place] [address]  Fax: [phone]

## 2011-04-18 NOTE — Op Note (Signed)
NAMESHAWN, [name]                ACCOUNT NO.:  [id number]   MEDICAL RECORD NO.:  [id number]          PATIENT TYPE:  AMB   LOCATION:  CARD                         FACILITY:  [facility]   PHYSICIAN:  Shan Levans, M.D. LHCDATE OF BIRTH:  20-Oct-1946   DATE OF PROCEDURE:  03/21/2005  DATE OF DISCHARGE:                                 OPERATIVE REPORT   CHIEF COMPLAINT:  Left lower lobe lung nodule and left upper lobe lingula  infiltrate, history of bronchiolitis obliterans organized pneumonia.   SURGEON:  Shan Levans, M.D.   ANESTHESIA:  1% Xylocaine local.   PREOP MEDICATION:  Demerol 50 mg, Versed 6 mg IV push.   DESCRIPTION OF PROCEDURE:  The Pentax video bronchoscope was introduced in  the right naris, the upper airways were visualized and revealed to be  unremarkable. The entire tracheobronchial tree was visualized and revealed  tracheobronchitis particularly in the left upper lobe lingula segment but no  endobronchial lesions were seen. Attention was then paid to the left upper  lobe, transbronchial biopsies x5 were obtained without difficulty.  Bronchioalveolar lavage 150 mL volume infused, 80 mL volume was returned.  Attention was then paid to the left lower lobe, the lateral segment was  biopsied x4. After the fourth biopsy, a 150 mL hemorrhage occurred, this was  controlled with topical epinephrine and 5000 units of topical thrombin. At  the end of the procedure, the patient had a clot that was retracting in the  left lower lobe, he was saturating 99% on 100% nonrebreather and had a blood  pressure of 140/99, pulse rate in the 80 range.   IMPRESSION:  Left lingula infiltrate with associated left lower lobe nodule,  rule out for malignancy, rule out bronchiolitis obliterans organized  pneumonia.   RECOMMENDATIONS:  Followup pathology, microbiology.      PW/MEDQ  D:  03/21/2005  T:  03/21/2005  Job:  [id number]   cc:   Dario Guardian, M.D.  [address]  [place]  [place] [address]  Fax: [phone]

## 2011-04-18 NOTE — Assessment & Plan Note (Signed)
[facility]                               PULMONARY OFFICE NOTE   NAME:[name], [place]                       MRN:          [id number]  DATE:08/27/2006                            DOB:          12-Jun-1946    [name] is a 65 year old white male, history of chronic obstructive lung  disease, asthmatic bronchitis.  Patient notes no real chest pain but is  still having chronic fatigue, chronic pain.  Is considering a fibromyalgia  specialty referral.  Patient maintains Spiriva daily, QVAR 80 mcg strength,  2 sprays b.i.d.   PHYSICAL EXAMINATION:  VITAL SIGNS:  Temp 98, blood pressure 134/80, pulse  87.  Saturation 96% on room air.  CHEST:  Diminished breath sounds with prolonged expiratory phase.  No wheeze  or rhonchi.  CARDIAC:  Regular rate and rhythm without S3.  Normal S1 and S2.  ABDOMEN:  Soft, nontender.  EXTREMITIES:  No clubbing or edema.  SKIN:  Clear.   IMPRESSION:  Chronic obstructive lung disease with asthmatic bronchitic  component.   The plan is to begin Symbicort 160/4.5 at 2 sprays b.i.d.  Discontinue  further QVAR.  Continue Spiriva daily.  Return in six weeks for a recheck.       Charlcie Cradle Delford Field, MD, [facility]      PEW/MedQ  DD:  08/27/2006  DT:  08/29/2006  Job #:  [id number]   cc:   Dario Guardian, M.D.

## 2011-04-18 NOTE — Assessment & Plan Note (Signed)
[facility]                               PULMONARY OFFICE NOTE   NAME:STONEBrain, Honeycutt                       MRN:          [id number]  DATE:09/10/2006                            DOB:          07-30-1946    Mr. Heyde is a 65 year old white male seen today as a work-in, complaining  of sore throat, cough with thick green mucus, some dyspnea, increased head  congestion, maintaining Symbicort 2 sprays b.i.d., Spiriva daily, Synthroid  137 mcg daily, Lexapro 10 mg daily, Vicodin q.i.d.   PHYSICAL EXAMINATION:  GENERAL:  This is a middle-aged male in no distress.  VITAL SIGNS:  Temp 97.8, blood pressure 126/80, pulse 76, saturation 98% on  room air.  CHEST:  Distant breath sounds.  No wheeze or rhonchi.  Relatively clear.  CARDIAC:  Regular rate and rhythm without S3.  Normal S1 and S2.  ABDOMEN:  Soft and nontender.  EXTREMITIES:  No clubbing or edema.  SKIN:  Clear.  NEUROLOGIC:  Intact.  HEENT/NECK:  No jugular venous distention, no lymphadenopathy.  Oropharynx  clear.  Neck supple.   IMPRESSION:  Chronic lung disease with asthmatic bronchitic component with  acute sinusitis.   PLAN:  Patient is to receive Avelox 400 mg a day for five days.  Maintain  inhaled medicines as currently dosed.  No change in plan of care was made.  We will see the patient back in followup in a month's time.       Charlcie Cradle Delford Field, MD, [facility]      PEW/MedQ  DD:  09/10/2006  DT:  09/12/2006  Job #:  [id number]   cc:   Dario Guardian, M.D.

## 2011-04-18 NOTE — Assessment & Plan Note (Signed)
[facility]                               PULMONARY OFFICE NOTE   NAME:STONEKervens, [name]                       MRN:          [id number]  DATE:08/07/2006                            DOB:          05/03/1946    HISTORY OF PRESENT ILLNESS:  Patient is a 65 year old white male patient  of Dr. Lynelle Doctor, who has a known history of COPD and asthmatic bronchitis,  who presents to the office complaining of a persistent cough, congestion,  and shortness of breath.  She was seen approximately one month ago for  similar symptoms and with a COPD exacerbation and treated with a seven day  course of Levaquin and prednisone taper.  Patient reports symptoms did  improve and had actually totally resolved up until four days ago.  She  denies any hemoptysis, leg swelling, chest pain, orthopnea, or overt reflux  symptoms.  A chest x-ray done last visit showed severe COPD with no acute  changes.   PAST MEDICAL HISTORY:  Reviewed.   CURRENT MEDICATIONS:  Reviewed.   PHYSICAL EXAMINATION:  GENERAL:  Patient is an elderly male in no acute  distress.  VITAL SIGNS:  She is afebrile with stable vital signs.  O2 saturation is 96%  on room air.  HEENT:  Unremarkable.  NECK:  Supple without adenopathy.  LUNGS:  Lung sounds reveal coarse rhonchi bilaterally, left greater than  right.  CARDIAC:  Regular rate.  ABDOMEN:  Soft, benign.  EXTREMITIES:  Warm without edema.   IMPRESSION/PLAN:  Acute exacerbation of chronic obstructive pulmonary  disease:  Patient is to begin Avelox for seven days.  Rechallenge with a  steroid taper, beginning at 40 mg, and decreasing by 10 mg every three days.  Patient will increase QVAR up to 80 mcg 2 puffs twice daily and was given a  Xopenex nebulizer treatment in the office.  She will recheck here in two  weeks with Dr. Delford Field or sooner if needed.                                   Rubye Oaks, NP                                Charlcie Cradle Delford Field, MD, FCCP   TP/MedQ  DD:  08/10/2006  DT:  08/10/2006  Job #:  [id number]

## 2011-04-18 NOTE — Assessment & Plan Note (Signed)
[facility]                             PULMONARY OFFICE NOTE   NAME:STONEMaynor, Mwangi                       MRN:          [id number]  DATE:01/28/2007                            DOB:          July 17, 1946    Mr. Rickman is a 65 year old white male, history of chronic obstructive  lung disease, ex-smoker since January 2002, also has a left lower lobe  noncalcified nodule we have been following.  He has no complaints today.  He states his level of dyspnea is better.  He is on Symbicort two sprays  b.i.d. 160/4.5 mcg, Spiriva one capsule daily.   PHYSICAL EXAMINATION:  VITAL SIGNS:  Temperature is 97, blood pressure  130/82, pulse 64, saturation 99% on room air.  GENERAL:  He is a middle-aged male in no distress.  CHEST:  Distant breath sounds but no evidence of wheeze or rhonchi.  CARDIAC:  A regular rate and rhythm without S3, normal S1, S2.  ABDOMEN:  Soft, nontender.  EXTREMITIES:  No edema or clubbing.  SKIN:  Clear.  NEUROLOGIC:  Intact.  HEENT:  No jugular venous distention or lymphadenopathy.  Oropharynx  clear.  NECK:  Supple.   Spirometry was obtained and showed essentially normal spirometry  although the FEF 25/75 is still reduced to 57% of predicted.   IMPRESSION:  Chronic obstructive air flow disease with stable air flow  function.   PLAN:  To maintain Spiriva and Symbicort as currently dosed.  We will  follow up another CT scan of his chest to follow up interval change and  left lower lobe nodule and return to this office in 4 months.     Charlcie Cradle Delford Field, MD, [facility]  Electronically Signed    PEW/MedQ  DD: 01/28/2007  DT: 01/28/2007  Job #: [id number]   cc:   Dario Guardian, M.D.

## 2011-04-18 NOTE — Cardiovascular Report (Signed)
[name], [name]                ACCOUNT NO.:  [id number]   MEDICAL RECORD NO.:  [id number]          PATIENT TYPE:  INP   LOCATION:  2858                         FACILITY:  [facility]   PHYSICIAN:  Armanda Magic, M.D.     DATE OF BIRTH:  [birth year]   DATE OF PROCEDURE:  01/27/2006  DATE OF DISCHARGE:  01/27/2006                              CARDIAC CATHETERIZATION   REFERRING PHYSICIAN:  Dr. Shan Levans.   PRIMARY PHYSICIAN:  Dr. Laurann Montana.   PROCEDURE:  Left heart catheterization, coronary angiography, left  ventriculography.   OPERATOR:  Armanda Magic, M.D.   INDICATIONS:  Chest pain with abnormal Cardiolite.   COMPLICATIONS:  None.   IV ACCESS:  Via right femoral artery 6-French sheath.   This is a very pleasant 65 year old white male who presented with episodes  of chest pain and had an abnormal Cardiolite which showed reversible defect  at the apex. He had recurrent chest pain yesterday in the office and I  admitted him to rule out myocardial infarction which he did by serial  enzymes and now presents for cardiac catheterization.   The patient is brought to cardiac catheterization laboratory in the fasting  nonsedated state. Informed consent was obtained. The patient was connected  to continuous heart rate, pulse oximetry monitoring and intermittent blood  pressure monitoring. The right groin was prepped and draped in the sterile  fashion. 1% Xylocaine was used for local anesthesia. Using the modified  Seldinger technique, a 6-French sheath was placed in the right femoral  artery. Under fluoroscopic guidance, a 6-French JL-4 catheter was placed in  the left coronary artery. Multiple cine films were taken at 30 degree RAO  and 40 degree LAO views. This catheter was then exchanged out over a  guidewire for 6-French JR-4 catheter which was placed under fluoroscopic  guidance in the right coronary artery. Multiple cine films were taken at 30  degree RAO and 40 degree LAO  views. This catheter was then exchanged out  over a guidewire for 6-French angled pigtail catheter which was placed under  fluoroscopic guidance in the left ventricular cavity. Left ventriculography  was performed in 30 degree RAO view using total of 30 cc of contrast at 15  cc per second. Catheter was then pulled back across the aortic valve with no  significant gradient noted. At the end of procedure, all catheters and  sheaths were removed. Manual compression was performed until adequate  hemostasis was obtained. The patient was transferred back to room in stable  condition.   RESULTS:  Left main coronary is widely patent but it is very short and  almost immediately bifurcates into a left anterior descending artery and  left circumflex artery. Left anterior descending artery is widely patent  throughout its course to the apex giving rise to two diagonal branches both  of which are widely patent.   The left circumflex is widely patent throughout its course in the AV groove.  It gives rise to a very small obtuse marginal one branch which is widely  patent. It gives rise to a second obtuse marginal  branch which is rather  large and bifurcates into two daughter branches, the superior branch is  small. The inferior branch is large and widely patent. The third obtuse  marginal branch has luminal irregularities of 20%. The left circumflex then  bifurcates distally in the posterior descending artery and fourth obtuse  marginal branch, both of which are widely patent. This is a left dominant  system.   The right coronary artery is nondominant and small but widely patent.   Left ventriculography has normal LV systolic function. EF of 55-60%. Aortic  pressure 148/79 mmHg, LV pressure 145/8 mmHg.   ASSESSMENT:  1.  Noncardiac chest pain.  2.  Normal coronary arteries.  3.  Normal left ventricular function.   PLAN:  Discharge to home after IV fluid and bedrest. Nexium 40 milligrams a  day.  Follow up with me in two weeks.      Armanda Magic, M.D.  Electronically Signed     TT/MEDQ  D:  01/27/2006  T:  01/27/2006  Job:  [id number]   cc:   Shan Levans, M.D. [facility]  [address]  [place]  [place] [address]   [place]. Cliffton Asters, M.D.  Fax: [phone]

## 2011-04-18 NOTE — Assessment & Plan Note (Signed)
[facility]                               PULMONARY OFFICE NOTE   NAME:[name], [place]                       MRN:          [id number]  DATE:07/07/2006                            DOB:          [birth year]    CHIEF COMPLAINT:  Fatigue, nausea, shortness of breath.   HISTORY OF PRESENT ILLNESS:  This is a 65 year old white male who presents  today with very vague complaints of fatigue and dyspnea.  He was seen in the  office by Dr. Merri Brunette yesterday, reporting approximately a 2-week  history of fatigue, malaise and increased shortness of breath over baseline.  He also reported increased nausea and nonspecific low back pain.  He was  initially seen by his primary care Hero Kulish with these complaints about 2  weeks ago and seen in return office visit.  After multiple diagnostic tests  were done and revealed no etiology, he was referred to see [place] in the  pulmonary office.  Upon questioning, Mr. Gaulin is very vague and difficult  to pin down on the onset of his clear symptoms and for that matter,  difficult to ascertain exactly what his chief complaint is.  He reports  generalized fatigue, malaise, and nonspecific back pain.  He has a baseline  history of urinary hesitancy and enlarged prostate, but he denies any  dysuria.  He states he has been slowly and progressively more short of  breath, now to the point where he gets winded just taking a shower.  However, he is very vague about what his baseline pulmonary status is, only  that he is much more labored at this point.  He denies fever.  He reports  occasional chills and hot flashes.  He has had occasional episodes of nausea  as well as cough when in the recumbent position, but states that it is  productive with green sputum.  He reports increased wheeze, occasional  pressure over his anterior chest, but denies any orthopnea.  He denies any  lower extremity edema, or left-sided chest discomfort.  He  has recently had  a cardiac catheterization from Dr. Armanda Magic.  He was demonstrated to  have clean coronary arteries and felt that his chest pain was noncardiac in  nature.  As indicated, he has had a history of occasional chest discomfort  and was worked up extensively by Dr. Delford Field, including a spiral CT, which  was negative for pulmonary embolus.   PAST MEDICAL HISTORY:  1.  Chronic obstructive pulmonary disease.  2.  Anxiety.  3.  Depression.  4.  Left lower lobe pulmonary nodule.   MEDICATIONS:  Synthroid, Lexapro, Vicodin, Spiriva, and Qvar.   PHYSICAL EXAMINATION:  VITAL SIGNS:  Temperature 97.3, blood pressure  120/88, pulse rate 80.  Saturation is 97%.  HEENT:  No JVD or adenopathy.  Mucous membranes are moist, no significant  posterior pharynx erythema.  LUNGS:  Breath sounds have occasional scattered rhonchi and focal left lower  lobe rales.  Chest x-ray was obtained, which demonstrated chronic scarring  of the left lower lobe, previously mentioned pulmonary nodule  in the left  lower lobe, and chronic obstructive pulmonary disease.  This was reviewed  with Dr. Delford Field.  CARDIAC:  Regular rate and rhythm.  EXTREMITIES:  No edema, brisk capillary refill.  ABDOMEN:  Soft and nontender.   IMPRESSION AND PLAN:  Probable acute exacerbation of chronic obstructive  pulmonary disease.  Plan for this is to continue his course of Levaquin as  prescribed by his primary care Maricia Scotti.  Additionally, I will begin him on  a prednisone taper, for which he will take prednisone 40 mg daily x4 days,  then decrease by 10 mg every 4 days until gone.  I have instructed should  his symptoms not improve, he would need to return to the office to see Dr.  Delford Field.  I discussed this plan of care with Dr. Delford Field prior to completion  of office visit.  The patient was informed should his symptoms worsen, to  return to the office sooner if needed.                                   Zenia Resides, NP                                Charlcie Cradle. Delford Field, MD, FCCP   PB/MedQ  DD:  07/07/2006  DT:  07/08/2006  Job #:  [id number]

## 2011-05-23 ENCOUNTER — Encounter: Payer: Self-pay | Admitting: Critical Care Medicine

## 2011-05-26 ENCOUNTER — Encounter: Payer: Self-pay | Admitting: Critical Care Medicine

## 2011-05-26 ENCOUNTER — Ambulatory Visit (INDEPENDENT_AMBULATORY_CARE_PROVIDER_SITE_OTHER): Payer: Medicare Other | Admitting: Critical Care Medicine

## 2011-05-26 VITALS — BP 136/86 | HR 56 | Temp 98.0°F | Ht 75.0 in | Wt 211.0 lb

## 2011-05-26 DIAGNOSIS — J984 Other disorders of lung: Secondary | ICD-10-CM

## 2011-05-26 DIAGNOSIS — J449 Chronic obstructive pulmonary disease, unspecified: Secondary | ICD-10-CM

## 2011-05-26 MED ORDER — PREDNISONE 10 MG PO TABS: ORAL_TABLET | ORAL | Status: DC

## 2011-05-26 MED ORDER — FLUTICASONE-SALMETEROL 115-21 MCG/ACT IN AERO: 2.0000 | INHALATION_SPRAY | Freq: Two times a day (BID) | RESPIRATORY_TRACT | Status: DC

## 2011-05-26 NOTE — Patient Instructions (Signed)
Stay on Advair two puff twice daily Take prednisone 10mg  Take 4 for three days, 3 for three days, 2 for three days, 1 for three days and stop Obtain a CT Chest to follow up the Left lung nodule, I will call with results Return 3 months

## 2011-05-26 NOTE — Assessment & Plan Note (Signed)
G[place] 0 COPD with asthmatic bronchitis associated symptoms FeV1 82%  Fef 25/75 70% 05/26/11 Pt clearly worse off Advair Plan Resume Advair 115 two puff bid, technique is excellent Prednisone pulse for 12 days

## 2011-05-26 NOTE — Progress Notes (Signed)
Subjective:    Patient ID: [name], male    DOB: 07-14-1946, 65 y.o.   MRN: [id number]  HPI 64yo WM follow-up of bronchitis and COPD. Moderate copd/AB  September 12, 2010 9:00 AM  Noting more dyspneic. Notes more discomfort in chest and back area. Notes pain in back area. No real cough. Notes some wheezing.  Not much pndrip. Noting changes over the summer time.  October 22, 2010 3:47 PM  Pt notes symptoms better then worse again. pred helped but advair not holding the patient  No mucus. Issue is more dyspnea. Still with chest pain, this went away and has returned  Notes some wheezing.  December 03, 2010 4:28 PM  Got a cold a few weeks ago and would not go away and now several days of dyspnea.  Not coughing up much btu when does is green yellow. Nose runs. Dyspnea with exertion  Will note some wheezing. No fever. No sweats  Pred pulse helped in October and November.  January 14, 2011 2:14 PM  Not much change from before. The bronchitis did clear up. Pt is still wheezing the same and dyspnea is the same. No nasal drainage. Now off qvar and abx. Pt is better and on advair alone now.   05/26/2011 Noting progressive dyspnea,  Still with wheeze and dyspnea.  Not much mucus when coughs and cough is minimal.   Not much congestion.  No additional ABX since 2/12 Pt stopped using Advair a few weeks ago and may be better..  Now using the rescue inhaler 2-3 times.   Past Medical History  Diagnosis Date  . Anxiety state, unspecified   . Chronic airway obstruction, not elsewhere classified   . Depressive disorder, not elsewhere classified   . Hypothyroidism      Family History  Problem Relation Age of Onset  . Prostate cancer Father   . Lung cancer Father   . Allergies Sister      History   Social History  . Marital Status: Married    Spouse Name: N/A    Number of Children: N/A  . Years of Education: N/A   Occupational History  . Not on file.   Social History Main Topics  .  Smoking status: Former Smoker -- 2.0 packs/day for 40 years    Types: Cigarettes    Quit date: 12/01/2001  . Smokeless tobacco: Never Used   Comment: 2ppd x 43 years  . Alcohol Use: Not on file  . Drug Use: Not on file  . Sexually Active: Not on file   Other Topics Concern  . Not on file   Social History Narrative  . No narrative on file     Allergies  Allergen Reactions  . Doxycycline     REACTION: nausea  . Guaifenesin      Outpatient Prescriptions Prior to Visit  Medication Sig Dispense Refill  . albuterol (VENTOLIN HFA) 108 (90 BASE) MCG/ACT inhaler Inhale 2 puffs into the lungs every 6 (six) hours as needed.        [name] Kitchen escitalopram (LEXAPRO) 20 MG tablet Take 20 mg by mouth daily.        [name] Kitchen HYDROcodone-acetaminophen (NORCO) 5-325 MG per tablet Take 1 tablet by mouth every 6 (six) hours as needed.        [name] Kitchen levothyroxine (SYNTHROID, LEVOTHROID) 150 MCG tablet Take 150 mcg by mouth daily.        . ranitidine (ZANTAC) 150 MG capsule Take 150 mg by mouth 2 (  two) times daily as needed.       . fluticasone-salmeterol (ADVAIR HFA) 115-21 MCG/ACT inhaler Inhale 2 puffs into the lungs 2 (two) times daily.            Review of Systems Constitutional:   No  weight loss, night sweats,  Fevers, chills, fatigue, lassitude. HEENT:   No headaches,  Difficulty swallowing,  Tooth/dental problems,  Sore throat,                No sneezing, itching, ear ache, nasal congestion, post nasal drip,   CV:  No chest pain,  Orthopnea, PND, swelling in lower extremities, anasarca, dizziness, palpitations  GI  No heartburn, indigestion, abdominal pain, nausea, vomiting, diarrhea, change in bowel habits, loss of appetite  Resp: Notes  shortness of breath with exertion and at rest.  No excess mucus, no productive cough,  No non-productive cough,  No coughing up of blood.  No change in color of mucus.  No wheezing.  No chest wall deformity  Skin: no rash or lesions.  GU: no dysuria, change in color of  urine, no urgency or frequency.  No flank pain.  MS:  No joint pain or swelling.  No decreased range of motion.  No back pain.  Psych:  No change in mood or affect. No depression or anxiety.  No memory loss.     Objective:   Physical Exam Filed Vitals:   05/26/11 1005  BP: 136/86  Pulse: 56  Temp: 98 F (36.7 C)  TempSrc: Oral  Height: 6\' 3"  (1.905 m)  Weight: 211 lb (95.709 kg)  SpO2: 98%    Gen: Pleasant, well-nourished, in no distress,  normal affect  ENT: No lesions,  mouth clear,  oropharynx clear, no postnasal drip  Neck: No JVD, no TMG, no carotid bruits  Lungs: No use of accessory muscles, no dullness to percussion, distant BS, exp wheeze  Cardiovascular: RRR, heart sounds normal, no murmur or gallops, no peripheral edema  Abdomen: soft and NT, no HSM,  BS normal  Musculoskeletal: No deformities, no cyanosis or clubbing  Neuro: alert, non focal  Skin: Warm, no lesions or rashes      PFT Conversion 01/04/2009 09/12/2010  FVC 5.56 4.97  FVC PREDICT 5.26 5.21  FVC  % Predicted 106 95  FEV1 3.77 3.33  FEV1 PREDICT 3.59 3.53  FEV % Predicted 105 94  FEV1/FVC 67.8 67  FEV1/FVC PRE 68 99  FeF 25-75 2.16 2.43  FeF 25-75 % Predicted 3.16 3.07  FEF % EXPEC 68 79  POST FVC 5.42   FVCPRDPST 5.26   POST FVC%EXP 103   POST FEV1 3.91   FEV1PRDPST 3.59   POSTFEV1%PRD 109   PSTFEV1/FVC 72   FEV1FVCPRDPS 68   PSTFEF25/75% 2.83   PSTFEF25/75P 3.16   FEF2575%EXPS 90   Residual Volume (ml) 3.36   RV % EXPECT 121   DLCO (ml/mmHg sec) 26.6   DLCO % EXPEC 99   DLCO/VA 3.86   DLCO/VA%EXP 105   PFT RSLT normal     FeV1 82%  Fef 25/75 70% 05/26/11  Assessment & Plan:   COPD Golds 0 COPD with asthmatic bronchitis associated symptoms FeV1 82%  Fef 25/75 70% 05/26/11 Pt clearly worse off Advair Plan Resume Advair 115 two puff bid, technique is excellent Prednisone pulse for 12 days     Updated Medication List Outpatient Encounter Prescriptions as of  05/26/2011  Medication Sig Dispense Refill  . albuterol (VENTOLIN HFA) 108 (90 BASE)  MCG/ACT inhaler Inhale 2 puffs into the lungs every 6 (six) hours as needed.        [name] Kitchen escitalopram (LEXAPRO) 20 MG tablet Take 20 mg by mouth daily.        [name] Kitchen HYDROcodone-acetaminophen (NORCO) 5-325 MG per tablet Take 1 tablet by mouth every 6 (six) hours as needed.        [name] Kitchen levothyroxine (SYNTHROID, LEVOTHROID) 150 MCG tablet Take 150 mcg by mouth daily.        . ranitidine (ZANTAC) 150 MG capsule Take 150 mg by mouth 2 (two) times daily as needed.       . fluticasone-salmeterol (ADVAIR HFA) 115-21 MCG/ACT inhaler Inhale 2 puffs into the lungs 2 (two) times daily.  1 Inhaler  12  . predniSONE (DELTASONE) 10 MG tablet Take 4 for three days, 3 for three days, 2 for three days, 1 for three days and stop   30 tablet  0  . DISCONTD: fluticasone-salmeterol (ADVAIR HFA) 115-21 MCG/ACT inhaler Inhale 2 puffs into the lungs 2 (two) times daily.

## 2011-05-28 ENCOUNTER — Ambulatory Visit (INDEPENDENT_AMBULATORY_CARE_PROVIDER_SITE_OTHER)
Admission: RE | Admit: 2011-05-28 | Discharge: 2011-05-28 | Disposition: A | Discharge status: Home / Self Care | Payer: Medicare Other | Source: Ambulatory Visit | Attending: Critical Care Medicine | Admitting: Critical Care Medicine

## 2011-05-28 DIAGNOSIS — J984 Other disorders of lung: Secondary | ICD-10-CM

## 2011-08-26 ENCOUNTER — Ambulatory Visit (INDEPENDENT_AMBULATORY_CARE_PROVIDER_SITE_OTHER): Payer: Medicare Other | Admitting: Critical Care Medicine

## 2011-08-26 ENCOUNTER — Encounter: Payer: Self-pay | Admitting: Critical Care Medicine

## 2011-08-26 DIAGNOSIS — J449 Chronic obstructive pulmonary disease, unspecified: Secondary | ICD-10-CM

## 2011-08-26 DIAGNOSIS — F411 Generalized anxiety disorder: Secondary | ICD-10-CM

## 2011-08-26 DIAGNOSIS — F329 Major depressive disorder, single episode, unspecified: Secondary | ICD-10-CM

## 2011-08-26 NOTE — Patient Instructions (Signed)
No change in medications. Return in        6 months        

## 2011-08-26 NOTE — Progress Notes (Signed)
Subjective:    Patient ID: [name], male    DOB: [birth year], 65 y.o.   MRN: [id number]  HPI  64yo WM follow-up of bronchitis and COPD. Moderate copd/AB  September 12, 2010 9:00 AM  Noting more dyspneic. Notes more discomfort in chest and back area. Notes pain in back area. No real cough. Notes some wheezing.  Not much pndrip. Noting changes over the summer time.  October 22, 2010 3:47 PM  Pt notes symptoms better then worse again. pred helped but advair not holding the patient  No mucus. Issue is more dyspnea. Still with chest pain, this went away and has returned  Notes some wheezing.  December 03, 2010 4:28 PM  Got a cold a few weeks ago and would not go away and now several days of dyspnea.  Not coughing up much btu when does is green yellow. Nose runs. Dyspnea with exertion  Will note some wheezing. No fever. No sweats  Pred pulse helped in October and November.  January 14, 2011 2:14 PM  Not much change from before. The bronchitis did clear up. Pt is still wheezing the same and dyspnea is the same. No nasal drainage. Now off qvar and abx. Pt is better and on advair alone now.   05/26/11 Noting progressive dyspnea,  Still with wheeze and dyspnea.  Not much mucus when coughs and cough is minimal.   Not much congestion.  No additional ABX since 2/12 Pt stopped using Advair a few weeks ago and may be better..  Now using the rescue inhaler 2-3 times.   08/26/11 Dyspnea is the same.  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  Cough is minimal and no mucus Pt denies any significant sore throat, nasal congestion or excess secretions, fever, chills, sweats, unintended weight loss, pleurtic or exertional chest pain, orthopnea PND, or leg swelling Pt denies any increase in rescue therapy over baseline, denies waking up needing it or having any early am or nocturnal exacerbations of coughing/wheezing/or dyspnea. Pt also denies any obvious fluctuation in  symptoms with  weather or environmental change or other alleviating or aggravating factors   Past Medical History  Diagnosis Date  . Anxiety state, unspecified   . Chronic airway obstruction, not elsewhere classified   . Depressive disorder, not elsewhere classified   . Hypothyroidism      Family History  Problem Relation Age of Onset  . Prostate cancer Father   . Lung cancer Father   . Allergies Sister      History   Social History  . Marital Status: Married    Spouse Name: N/A    Number of Children: N/A  . Years of Education: N/A   Occupational History  . Not on file.   Social History Main Topics  . Smoking status: Former Smoker -- 2.0 packs/day for 40 years    Types: Cigarettes    Quit date: 12/01/2001  . Smokeless tobacco: Never Used   Comment: 2ppd x 43 years  . Alcohol Use: Not on file  . Drug Use: Not on file  . Sexually Active: Not on file   Other Topics Concern  . Not on file   Social History Narrative  . No narrative on file     Allergies  Allergen Reactions  . Doxycycline     REACTION: nausea  . Guaifenesin      Outpatient Prescriptions Prior to Visit  Medication Sig Dispense Refill  . albuterol (VENTOLIN  HFA) 108 (90 BASE) MCG/ACT inhaler Inhale 2 puffs into the lungs every 6 (six) hours as needed.        [name] Kitchen escitalopram (LEXAPRO) 20 MG tablet Take 20 mg by mouth daily.        . fluticasone-salmeterol (ADVAIR HFA) 115-21 MCG/ACT inhaler Inhale 2 puffs into the lungs 2 (two) times daily.  1 Inhaler  12  . HYDROcodone-acetaminophen (NORCO) 5-325 MG per tablet Take 1 tablet by mouth every 6 (six) hours as needed.        [name] Kitchen levothyroxine (SYNTHROID, LEVOTHROID) 150 MCG tablet Take 150 mcg by mouth daily.        . ranitidine (ZANTAC) 150 MG capsule Take 150 mg by mouth 2 (two) times daily as needed.       . predniSONE (DELTASONE) 10 MG tablet Take 4 for three days, 3 for three days, 2 for three days, 1 for three days and stop   30 tablet  0       Review of Systems  Constitutional:   No  weight loss, night sweats,  Fevers, chills, fatigue, lassitude. HEENT:   No headaches,  Difficulty swallowing,  Tooth/dental problems,  Sore throat,                No sneezing, itching, ear ache, nasal congestion, post nasal drip,   CV:  No chest pain,  Orthopnea, PND, swelling in lower extremities, anasarca, dizziness, palpitations  GI  No heartburn, indigestion, abdominal pain, nausea, vomiting, diarrhea, change in bowel habits, loss of appetite  Resp: Notes  shortness of breath with exertion and at rest.  No excess mucus, no productive cough,  No non-productive cough,  No coughing up of blood.  No change in color of mucus.  No wheezing.  No chest wall deformity  Skin: no rash or lesions.  GU: no dysuria, change in color of urine, no urgency or frequency.  No flank pain.  MS:  No joint pain or swelling.  No decreased range of motion.  No back pain.  Psych:  No change in mood or affect. No depression or anxiety.  No memory loss.     Objective:   Physical Exam  Filed Vitals:   08/26/11 0945  BP: 148/88  Pulse: 53  Temp: 97.7 F (36.5 C)  TempSrc: Oral  Height: 6\' 2"  (1.88 m)  Weight: 208 lb 3.2 oz (94.439 kg)  SpO2: 99%    Gen: Pleasant, well-nourished, in no distress,  normal affect  ENT: No lesions,  mouth clear,  oropharynx clear, no postnasal drip  Neck: No JVD, no TMG, no carotid bruits  Lungs: No use of accessory muscles, no dullness to percussion, distant BS, exp wheeze  Cardiovascular: RRR, heart sounds normal, no murmur or gallops, no peripheral edema  Abdomen: soft and NT, no HSM,  BS normal  Musculoskeletal: No deformities, no cyanosis or clubbing  Neuro: alert, non focal  Skin: Warm, no lesions or rashes      PFT Conversion 01/04/2009 09/12/2010  FVC 5.56 4.97  FVC PREDICT 5.26 5.21  FVC  % Predicted 106 95  FEV1 3.77 3.33  FEV1 PREDICT 3.59 3.53  FEV % Predicted 105 94  FEV1/FVC 67.8 67   FEV1/FVC PRE 68 99  FeF 25-75 2.16 2.43  FeF 25-75 % Predicted 3.16 3.07  FEF % EXPEC 68 79  POST FVC 5.42   FVCPRDPST 5.26   POST FVC%EXP 103   POST FEV1 3.91   FEV1PRDPST 3.59   POSTFEV1%PRD 109  PSTFEV1/FVC 72   FEV1FVCPRDPS 68   PSTFEF25/75% 2.83   PSTFEF25/75P 3.16   FEF2575%EXPS 90   Residual Volume (ml) 3.36   RV % EXPECT 121   DLCO (ml/mmHg sec) 26.6   DLCO % EXPEC 99   DLCO/VA 3.86   DLCO/VA%EXP 105   PFT RSLT normal     FeV1 82%  Fef 25/75 70% 05/26/11  Assessment & Plan:   COPD chronic obstructive lung disease with asthmatic bronchitic component and reactive airways disease component Plan Continued inhaled medications as prescribed     Updated Medication List Outpatient Encounter Prescriptions as of 08/26/2011  Medication Sig Dispense Refill  . albuterol (VENTOLIN HFA) 108 (90 BASE) MCG/ACT inhaler Inhale 2 puffs into the lungs every 6 (six) hours as needed.        [name] Kitchen escitalopram (LEXAPRO) 20 MG tablet Take 20 mg by mouth daily.        . fluticasone-salmeterol (ADVAIR HFA) 115-21 MCG/ACT inhaler Inhale 2 puffs into the lungs 2 (two) times daily.  1 Inhaler  12  . HYDROcodone-acetaminophen (NORCO) 5-325 MG per tablet Take 1 tablet by mouth every 6 (six) hours as needed.        [name] Kitchen levothyroxine (SYNTHROID, LEVOTHROID) 150 MCG tablet Take 150 mcg by mouth daily.        . ranitidine (ZANTAC) 150 MG capsule Take 150 mg by mouth 2 (two) times daily as needed.       [name] Kitchen DISCONTD: predniSONE (DELTASONE) 10 MG tablet Take 4 for three days, 3 for three days, 2 for three days, 1 for three days and stop   30 tablet  0

## 2011-08-27 NOTE — Assessment & Plan Note (Addendum)
chronic obstructive lung disease with asthmatic bronchitic component and reactive airways disease component Plan Continued inhaled medications as prescribed

## 2012-02-20 ENCOUNTER — Other Ambulatory Visit: Payer: Self-pay | Admitting: Gastroenterology

## 2012-02-24 ENCOUNTER — Encounter: Payer: Self-pay | Admitting: Critical Care Medicine

## 2012-02-24 ENCOUNTER — Ambulatory Visit (INDEPENDENT_AMBULATORY_CARE_PROVIDER_SITE_OTHER): Payer: Medicare Other | Admitting: Critical Care Medicine

## 2012-02-24 VITALS — BP 150/94 | HR 65 | Temp 98.4°F | Ht 74.0 in | Wt 220.6 lb

## 2012-02-24 DIAGNOSIS — J984 Other disorders of lung: Secondary | ICD-10-CM | POA: Diagnosis not present

## 2012-02-24 DIAGNOSIS — J449 Chronic obstructive pulmonary disease, unspecified: Secondary | ICD-10-CM | POA: Diagnosis not present

## 2012-02-24 DIAGNOSIS — E291 Testicular hypofunction: Secondary | ICD-10-CM

## 2012-02-24 DIAGNOSIS — K5010 Crohn's disease of large intestine without complications: Secondary | ICD-10-CM

## 2012-02-24 NOTE — Patient Instructions (Signed)
No change in medications. Return in        6 months        

## 2012-02-24 NOTE — Assessment & Plan Note (Signed)
Gold A COPD with asthmatic bronchitic component Table at this time Plan Maintain Advair at current dose rate

## 2012-02-24 NOTE — Progress Notes (Signed)
Subjective:    Patient ID: [name], male    DOB: October 28, 1946, 66 y.o.   MRN: [id number]  HPI  66 y.o. [name]   08/26/11 Dyspnea is the same.  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  Cough is minimal and no mucus Pt denies any significant sore throat, nasal congestion or excess secretions, fever, chills, sweats, unintended weight loss, pleurtic or exertional chest pain, orthopnea PND, or leg swelling Pt denies any increase in rescue therapy over baseline, denies waking up needing it or having any early am or nocturnal exacerbations of coughing/wheezing/or dyspnea. Pt also denies any obvious fluctuation in symptoms with  weather or environmental change or other alleviating or aggravating factors  02/24/2012 No changes, Dyspnea is the same.  Notes some pn drip.   Pt denies any significant sore throat, nasal congestion or excess secretions, fever, chills, sweats, unintended weight loss, pleurtic or exertional chest pain, orthopnea PND, or leg swelling Pt denies any increase in rescue therapy over baseline, denies waking up needing it or having any early am or nocturnal exacerbations of coughing/wheezing/or dyspnea. Pt also denies any obvious fluctuation in symptoms with  weather or environmental change or other alleviating or aggravating factors   Past Medical History  Diagnosis Date  . Anxiety state, unspecified   . Chronic airway obstruction, not elsewhere classified   . Depressive disorder, not elsewhere classified   . Hypothyroidism      Family History  Problem Relation Age of Onset  . Prostate cancer Father   . Lung cancer Father   . Allergies Sister      History   Social History  . Marital Status: Married    Spouse Name: N/A    Number of Children: N/A  . Years of Education: N/A   Occupational History  . Not on file.   Social History Main Topics  . Smoking status: Former Smoker -- 2.0 packs/day for 40 years    Types:  Cigarettes    Quit date: 12/01/2001  . Smokeless tobacco: Never Used   Comment: 2ppd x 43 years.  Still uses nicotine gum.  [name] Kitchen Alcohol Use: Not on file  . Drug Use: Not on file  . Sexually Active: Not on file   Other Topics Concern  . Not on file   Social History Narrative  . No narrative on file     Allergies  Allergen Reactions  . Doxycycline     REACTION: nausea  . Guaifenesin      Outpatient Prescriptions Prior to Visit  Medication Sig Dispense Refill  . albuterol (VENTOLIN HFA) 108 (90 BASE) MCG/ACT inhaler Inhale 2 puffs into the lungs every 6 (six) hours as needed.        [name] Kitchen escitalopram (LEXAPRO) 20 MG tablet Take 20 mg by mouth daily.        . fluticasone-salmeterol (ADVAIR HFA) 115-21 MCG/ACT inhaler Inhale 2 puffs into the lungs 2 (two) times daily.  1 Inhaler  12  . HYDROcodone-acetaminophen (NORCO) 5-325 MG per tablet Take 1 tablet by mouth every 6 (six) hours as needed.        [name] Kitchen levothyroxine (SYNTHROID, LEVOTHROID) 150 MCG tablet Take 150 mcg by mouth daily.        . ranitidine (ZANTAC) 150 MG capsule Take 150 mg by mouth 2 (two) times daily as needed.           Review of Systems  Constitutional:   No  weight loss, night sweats,  Fevers, chills, fatigue, lassitude. HEENT:   No headaches,  Difficulty swallowing,  Tooth/dental problems,  Sore throat,                No sneezing, itching, ear ache, nasal congestion, post nasal drip,   CV:  No chest pain,  Orthopnea, PND, swelling in lower extremities, anasarca, dizziness, palpitations  GI  No heartburn, indigestion, abdominal pain, nausea, vomiting, diarrhea, change in bowel habits, loss of appetite  Resp: Notes  shortness of breath with exertion and at rest.  No excess mucus, no productive cough,  No non-productive cough,  No coughing up of blood.  No change in color of mucus.  No wheezing.  No chest wall deformity  Skin: no rash or lesions.  GU: no dysuria, change in color of urine, no urgency or frequency.   No flank pain.  MS:  No joint pain or swelling.  No decreased range of motion.  No back pain.  Psych:  No change in mood or affect. No depression or anxiety.  No memory loss.     Objective:   Physical Exam  Filed Vitals:   02/24/12 0919  BP: 150/94  Pulse: 65  Temp: 98.4 F (36.9 C)  TempSrc: Oral  Height: 6\' 2"  (1.88 m)  Weight: 220 lb 9.6 oz (100.064 kg)  SpO2: 98%    Gen: Pleasant, well-nourished, in no distress,  normal affect  ENT: No lesions,  mouth clear,  oropharynx clear, no postnasal drip  Neck: No JVD, no TMG, no carotid bruits  Lungs: No use of accessory muscles, no dullness to percussion, distant BS,   Cardiovascular: RRR, heart sounds normal, no murmur or gallops, no peripheral edema  Abdomen: soft and NT, no HSM,  BS normal  Musculoskeletal: No deformities, no cyanosis or clubbing  Neuro: alert, non focal  Skin: Warm, no lesions or rashes      PFT Conversion 01/04/2009 09/12/2010  FVC 5.56 4.97  FVC PREDICT 5.26 5.21  FVC  % Predicted 106 95  FEV1 3.77 3.33  FEV1 PREDICT 3.59 3.53  FEV % Predicted 105 94  FEV1/FVC 67.8 67  FEV1/FVC PRE 68 99  FeF 25-75 2.16 2.43  FeF 25-75 % Predicted 3.16 3.07  FEF % EXPEC 68 79  POST FVC 5.42   FVCPRDPST 5.26   POST FVC%EXP 103   POST FEV1 3.91   FEV1PRDPST 3.59   POSTFEV1%PRD 109   PSTFEV1/FVC 72   FEV1FVCPRDPS 68   PSTFEF25/75% 2.83   PSTFEF25/75P 3.16   FEF2575%EXPS 90   Residual Volume (ml) 3.36   RV % EXPECT 121   DLCO (ml/mmHg sec) 26.6   DLCO % EXPEC 99   DLCO/VA 3.86   DLCO/VA%EXP 105   PFT RSLT normal     FeV1 82%  Fef 25/75 70% 05/26/11  Assessment & Plan:   COPD Gold A COPD with asthmatic bronchitic component Table at this time Plan Maintain Advair at current dose rate     Updated Medication List Outpatient Encounter Prescriptions as of 02/24/2012  Medication Sig Dispense Refill  . albuterol (VENTOLIN HFA) 108 (90 BASE) MCG/ACT inhaler Inhale 2 puffs into the lungs every  6 (six) hours as needed.        . B-D 3CC LUER-LOK SYR 18GX1-1/2 18G X 1-1/2" 3 ML MISC As directed      . escitalopram (LEXAPRO) 20 MG tablet Take 20 mg by mouth daily.        . fluticasone-salmeterol (ADVAIR  HFA) 115-21 MCG/ACT inhaler Inhale 2 puffs into the lungs 2 (two) times daily.  1 Inhaler  12  . HYDROcodone-acetaminophen (NORCO) 5-325 MG per tablet Take 1 tablet by mouth every 6 (six) hours as needed.        [name] Kitchen levothyroxine (SYNTHROID, LEVOTHROID) 150 MCG tablet Take 150 mcg by mouth daily.        . ranitidine (ZANTAC) 150 MG capsule Take 150 mg by mouth 2 (two) times daily as needed.       . testosterone cypionate (DEPOTESTOTERONE CYPIONATE) 200 MG/ML injection Once every 2 weeks.

## 2012-02-26 ENCOUNTER — Other Ambulatory Visit: Payer: Self-pay | Admitting: Gastroenterology

## 2012-02-26 ENCOUNTER — Ambulatory Visit
Admission: RE | Admit: 2012-02-26 | Discharge: 2012-02-26 | Disposition: A | Discharge status: Home / Self Care | Payer: BC Managed Care – PPO | Source: Ambulatory Visit | Attending: Gastroenterology | Admitting: Gastroenterology

## 2012-02-26 DIAGNOSIS — K5900 Constipation, unspecified: Secondary | ICD-10-CM

## 2012-05-21 ENCOUNTER — Other Ambulatory Visit: Payer: Self-pay | Admitting: Critical Care Medicine

## 2012-05-21 DIAGNOSIS — J449 Chronic obstructive pulmonary disease, unspecified: Secondary | ICD-10-CM

## 2012-05-21 MED ORDER — FLUTICASONE-SALMETEROL 115-21 MCG/ACT IN AERO: 2.0000 | INHALATION_SPRAY | Freq: Two times a day (BID) | RESPIRATORY_TRACT | Status: DC

## 2012-05-21 NOTE — Telephone Encounter (Signed)
Received faxed refill request from [facility] [address] to change Advair HFA 115/20mcg to a 90 day supply. Patient last seen 02/24/12. Rx changed and sent to pharmacy

## 2012-07-07 ENCOUNTER — Other Ambulatory Visit: Payer: Self-pay | Admitting: Critical Care Medicine

## 2012-08-27 ENCOUNTER — Encounter: Payer: Self-pay | Admitting: Critical Care Medicine

## 2012-08-27 ENCOUNTER — Ambulatory Visit (INDEPENDENT_AMBULATORY_CARE_PROVIDER_SITE_OTHER): Payer: Medicare Other | Admitting: Critical Care Medicine

## 2012-08-27 VITALS — BP 128/88 | HR 71 | Temp 98.6°F | Ht 75.0 in | Wt 208.0 lb

## 2012-08-27 DIAGNOSIS — J449 Chronic obstructive pulmonary disease, unspecified: Secondary | ICD-10-CM

## 2012-08-27 DIAGNOSIS — Z23 Encounter for immunization: Secondary | ICD-10-CM | POA: Diagnosis not present

## 2012-08-27 MED ORDER — FLUTICASONE-SALMETEROL 115-21 MCG/ACT IN AERO: 2.0000 | INHALATION_SPRAY | Freq: Two times a day (BID) | RESPIRATORY_TRACT | Status: DC

## 2012-08-27 NOTE — Patient Instructions (Addendum)
Flu vaccine was given No change in medications. Return in          6 months

## 2012-08-27 NOTE — Assessment & Plan Note (Signed)
Gold stage a COPD with asthmatic bronchitic component stable at this time Plan Maintain Advair Administer flu vaccine Return 6 months

## 2012-08-27 NOTE — Progress Notes (Signed)
Subjective:    Patient ID: [name], male    DOB: Apr 10, 1946, 66 y.o.   MRN: [id number]  HPI  66 y.o. [name]   08/26/11 Dyspnea is the same.  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  Cough is minimal and no mucus Pt denies any significant sore throat, nasal congestion or excess secretions, fever, chills, sweats, unintended weight loss, pleurtic or exertional chest pain, orthopnea PND, or leg swelling Pt denies any increase in rescue therapy over baseline, denies waking up needing it or having any early am or nocturnal exacerbations of coughing/wheezing/or dyspnea. Pt also denies any obvious fluctuation in symptoms with  weather or environmental change or other alleviating or aggravating factors  02/24/12 No changes, Dyspnea is the same.  Notes some pn drip.   Pt denies any significant sore throat, nasal congestion or excess secretions, fever, chills, sweats, unintended weight loss, pleurtic or exertional chest pain, orthopnea PND, or leg swelling Pt denies any increase in rescue therapy over baseline, denies waking up needing it or having any early am or nocturnal exacerbations of coughing/wheezing/or dyspnea. Pt also denies any obvious fluctuation in symptoms with  weather or environmental change or other alleviating or aggravating factors  08/27/2012 No real changes since 3/13.  No real cough.  Pt notes some dyspnea but is stable.   No real edema.  No heartburn.  Pt uses zantac prn. Pt denies any significant sore throat, nasal congestion or excess secretions, fever, chills, sweats, unintended weight loss, pleurtic or exertional chest pain, orthopnea PND, or leg swelling Pt denies any increase in rescue therapy over baseline, denies waking up needing it or having any early am or nocturnal exacerbations of coughing/wheezing/or dyspnea. Pt also denies any obvious fluctuation in symptoms with  weather or environmental change or other alleviating or  aggravating factors   Past Medical History  Diagnosis Date  . Anxiety state, unspecified   . Chronic airway obstruction, not elsewhere classified   . Depressive disorder, not elsewhere classified   . Hypothyroidism      Family History  Problem Relation Age of Onset  . Prostate cancer Father   . Lung cancer Father   . Allergies Sister      History   Social History  . Marital Status: Married    Spouse Name: N/A    Number of Children: N/A  . Years of Education: N/A   Occupational History  . Not on file.   Social History Main Topics  . Smoking status: Former Smoker -- 2.0 packs/day for 40 years    Types: Cigarettes    Quit date: 12/01/2001  . Smokeless tobacco: Never Used   Comment: 2ppd x 43 years.  Still uses nicotine gum.  [name] Kitchen Alcohol Use: Not on file  . Drug Use: Not on file  . Sexually Active: Not on file   Other Topics Concern  . Not on file   Social History Narrative  . No narrative on file     Allergies  Allergen Reactions  . Doxycycline     REACTION: nausea  . Guaifenesin      Outpatient Prescriptions Prior to Visit  Medication Sig Dispense Refill  . albuterol (VENTOLIN HFA) 108 (90 BASE) MCG/ACT inhaler Inhale 2 puffs into the lungs every 6 (six) hours as needed.        . B-D 3CC LUER-LOK SYR 18GX1-1/2 18G X 1-1/2" 3 ML MISC As directed      .  escitalopram (LEXAPRO) 20 MG tablet Take 20 mg by mouth daily.        [name] Kitchen HYDROcodone-acetaminophen (NORCO) 5-325 MG per tablet Take 1 tablet by mouth every 6 (six) hours as needed.        [name] Kitchen levothyroxine (SYNTHROID, LEVOTHROID) 150 MCG tablet Take 150 mcg by mouth daily.        . ranitidine (ZANTAC) 150 MG capsule Take 150 mg by mouth 2 (two) times daily as needed.       . testosterone cypionate (DEPOTESTOTERONE CYPIONATE) 200 MG/ML injection Once every 2 weeks.      [name] Kitchen ADVAIR HFA 115-21 MCG/ACT inhaler INHALE 2 PUFFS INTO THE LUNGS 2 TIMES DAILY.  12 g  2  . fluticasone-salmeterol (ADVAIR HFA) 115-21 MCG/ACT  inhaler Inhale 2 puffs into the lungs 2 (two) times daily.  3 Inhaler  3      Review of Systems  Constitutional:   No  weight loss, night sweats,  Fevers, chills, fatigue, lassitude. HEENT:   No headaches,  Difficulty swallowing,  Tooth/dental problems,  Sore throat,                No sneezing, itching, ear ache, nasal congestion, post nasal drip,   CV:  No chest pain,  Orthopnea, PND, swelling in lower extremities, anasarca, dizziness, palpitations  GI  No heartburn, indigestion, abdominal pain, nausea, vomiting, diarrhea, change in bowel habits, loss of appetite  Resp: Notes  shortness of breath with exertion and at rest.  No excess mucus, no productive cough,  No non-productive cough,  No coughing up of blood.  No change in color of mucus.  No wheezing.  No chest wall deformity  Skin: no rash or lesions.  GU: no dysuria, change in color of urine, no urgency or frequency.  No flank pain.  MS:  No joint pain or swelling.  No decreased range of motion.  No back pain.  Psych:  No change in mood or affect. No depression or anxiety.  No memory loss.     Objective:   Physical Exam  Filed Vitals:   08/27/12 1511  BP: 128/88  Pulse: 71  Temp: 98.6 F (37 C)  TempSrc: Oral  Height: 6\' 3"  (1.905 m)  Weight: 208 lb (94.348 kg)  SpO2: 94%    Gen: Pleasant, well-nourished, in no distress,  normal affect  ENT: No lesions,  mouth clear,  oropharynx clear, no postnasal drip  Neck: No JVD, no TMG, no carotid bruits  Lungs: No use of accessory muscles, no dullness to percussion, distant BS,   Cardiovascular: RRR, heart sounds normal, no murmur or gallops, no peripheral edema  Abdomen: soft and NT, no HSM,  BS normal  Musculoskeletal: No deformities, no cyanosis or clubbing  Neuro: alert, non focal  Skin: Warm, no lesions or rashes      PFT Conversion 01/04/2009 09/12/2010  FVC 5.56 4.97  FVC PREDICT 5.26 5.21  FVC  % Predicted 106 95  FEV1 3.77 3.33  FEV1 PREDICT 3.59  3.53  FEV % Predicted 105 94  FEV1/FVC 67.8 67  FEV1/FVC PRE 68 99  FeF 25-75 2.16 2.43  FeF 25-75 % Predicted 3.16 3.07  FEF % EXPEC 68 79  POST FVC 5.42   FVCPRDPST 5.26   POST FVC%EXP 103   POST FEV1 3.91   FEV1PRDPST 3.59   POSTFEV1%PRD 109   PSTFEV1/FVC 72   FEV1FVCPRDPS 68   PSTFEF25/75% 2.83   PSTFEF25/75P 3.16   FEF2575%EXPS 90   Residual  Volume (ml) 3.36   RV % EXPECT 121   DLCO (ml/mmHg sec) 26.6   DLCO % EXPEC 99   DLCO/VA 3.86   DLCO/VA%EXP 105   PFT RSLT normal     FeV1 82%  Fef 25/75 70% 05/26/11  Assessment & Plan:   COPD, Gold A wiht asthmatic bronchitic component Gold stage a COPD with asthmatic bronchitic component stable at this time Plan Maintain Advair Administer flu vaccine Return 6 months    Updated Medication List Outpatient Encounter Prescriptions as of 08/27/2012  Medication Sig Dispense Refill  . albuterol (VENTOLIN HFA) 108 (90 BASE) MCG/ACT inhaler Inhale 2 puffs into the lungs every 6 (six) hours as needed.        . B-D 3CC LUER-LOK SYR 18GX1-1/2 18G X 1-1/2" 3 ML MISC As directed      . escitalopram (LEXAPRO) 20 MG tablet Take 20 mg by mouth daily.        . fluticasone-salmeterol (ADVAIR HFA) 115-21 MCG/ACT inhaler Inhale 2 puffs into the lungs 2 (two) times daily.  12 g  11  . HYDROcodone-acetaminophen (NORCO) 5-325 MG per tablet Take 1 tablet by mouth every 6 (six) hours as needed.        [name] Kitchen levothyroxine (SYNTHROID, LEVOTHROID) 150 MCG tablet Take 150 mcg by mouth daily.        . ranitidine (ZANTAC) 150 MG capsule Take 150 mg by mouth 2 (two) times daily as needed.       . testosterone cypionate (DEPOTESTOTERONE CYPIONATE) 200 MG/ML injection Once every 2 weeks.      [name] Kitchen DISCONTD: ADVAIR HFA 115-21 MCG/ACT inhaler INHALE 2 PUFFS INTO THE LUNGS 2 TIMES DAILY.  12 g  2  . DISCONTD: fluticasone-salmeterol (ADVAIR HFA) 115-21 MCG/ACT inhaler Inhale 2 puffs into the lungs 2 (two) times daily.  3 Inhaler  3

## 2012-10-04 ENCOUNTER — Other Ambulatory Visit: Payer: Self-pay | Admitting: Critical Care Medicine

## 2012-10-06 DIAGNOSIS — M159 Polyosteoarthritis, unspecified: Secondary | ICD-10-CM | POA: Diagnosis not present

## 2012-10-06 DIAGNOSIS — M4000 Postural kyphosis, site unspecified: Secondary | ICD-10-CM | POA: Diagnosis not present

## 2012-10-06 DIAGNOSIS — M2550 Pain in unspecified joint: Secondary | ICD-10-CM | POA: Diagnosis not present

## 2012-10-06 DIAGNOSIS — Z79899 Other long term (current) drug therapy: Secondary | ICD-10-CM | POA: Diagnosis not present

## 2012-10-20 DIAGNOSIS — M2550 Pain in unspecified joint: Secondary | ICD-10-CM | POA: Diagnosis not present

## 2012-10-20 DIAGNOSIS — M4000 Postural kyphosis, site unspecified: Secondary | ICD-10-CM | POA: Diagnosis not present

## 2012-10-20 DIAGNOSIS — M159 Polyosteoarthritis, unspecified: Secondary | ICD-10-CM | POA: Diagnosis not present

## 2012-10-25 DIAGNOSIS — M159 Polyosteoarthritis, unspecified: Secondary | ICD-10-CM | POA: Diagnosis not present

## 2012-10-25 DIAGNOSIS — M2550 Pain in unspecified joint: Secondary | ICD-10-CM | POA: Diagnosis not present

## 2012-10-25 DIAGNOSIS — M4000 Postural kyphosis, site unspecified: Secondary | ICD-10-CM | POA: Diagnosis not present

## 2012-11-08 ENCOUNTER — Encounter: Payer: Self-pay | Admitting: Adult Health

## 2012-11-08 ENCOUNTER — Ambulatory Visit (INDEPENDENT_AMBULATORY_CARE_PROVIDER_SITE_OTHER): Payer: Medicare Other | Admitting: Adult Health

## 2012-11-08 ENCOUNTER — Ambulatory Visit (INDEPENDENT_AMBULATORY_CARE_PROVIDER_SITE_OTHER)
Admission: RE | Admit: 2012-11-08 | Discharge: 2012-11-08 | Disposition: A | Discharge status: Home / Self Care | Payer: Medicare Other | Source: Ambulatory Visit | Attending: Adult Health | Admitting: Adult Health

## 2012-11-08 VITALS — BP 106/74 | HR 75 | Temp 98.5°F | Ht 74.5 in | Wt 199.6 lb

## 2012-11-08 DIAGNOSIS — J449 Chronic obstructive pulmonary disease, unspecified: Secondary | ICD-10-CM

## 2012-11-08 DIAGNOSIS — J4489 Other specified chronic obstructive pulmonary disease: Secondary | ICD-10-CM

## 2012-11-08 MED ORDER — PREDNISONE 10 MG PO TABS: ORAL_TABLET | ORAL | Status: DC

## 2012-11-08 MED ORDER — AMOXICILLIN-POT CLAVULANATE 875-125 MG PO TABS: 1.0000 | ORAL_TABLET | Freq: Two times a day (BID) | ORAL | Status: AC

## 2012-11-08 NOTE — Patient Instructions (Addendum)
Augmentin 875mg  Twice daily  For 7 days  Mucinex DM Twice daily  As needed  Cough/congestion  Steroid taper over next week.  I will call with xray results.  Please contact office for sooner follow up if symptoms do not improve or worsen or seek emergency care

## 2012-11-08 NOTE — Progress Notes (Signed)
Subjective:    Patient ID: [name], male    DOB: Feb 12, 1946, 66 y.o.   MRN: [id number]  HPI  66 y.o. [name]   08/26/11 Dyspnea is the same.  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  Cough is minimal and no mucus  02/24/12 No changes, Dyspnea is the same.  Notes some pn drip.     08/27/2012 No real changes since 3/13.  No real cough.  Pt notes some dyspnea but is stable.   No real edema.  No heartburn.  Pt uses zantac prn.  11/08/2012 Acute OV  Complains of productive cough with green mucus, increased SOB, wheezing, increased fatigue x3 weeks. No otc used .  Leaving for [place] in 1 week for Christmas.  No fever, hemoptysis , weight loss, or edema.   Seen by PCP for recurrent hiccups and GERD last week.  Started on Zantac.  Has had almost constant hiccups for last 3 weeks. No known injury or recent surgeries .        Past Medical History  Diagnosis Date  . Anxiety state, unspecified   . Chronic airway obstruction, not elsewhere classified   . Depressive disorder, not elsewhere classified   . Hypothyroidism      Family History  Problem Relation Age of Onset  . Prostate cancer Father   . Lung cancer Father   . Allergies Sister      History   Social History  . Marital Status: Married    Spouse Name: N/A    Number of Children: N/A  . Years of Education: N/A   Occupational History  . Not on file.   Social History Main Topics  . Smoking status: Former Smoker -- 2.0 packs/day for 40 years    Types: Cigarettes    Quit date: 12/01/2001  . Smokeless tobacco: Never Used     Comment: 2ppd x 43 years.  Still uses nicotine gum.  [name] Kitchen Alcohol Use: Not on file  . Drug Use: Not on file  . Sexually Active: Not on file   Other Topics Concern  . Not on file   Social History Narrative  . No narrative on file     Allergies  Allergen Reactions  . Doxycycline     REACTION: nausea  . Guaifenesin      Outpatient  Prescriptions Prior to Visit  Medication Sig Dispense Refill  . ADVAIR HFA 115-21 MCG/ACT inhaler INHALE 2 PUFFS INTO THE LUNGS 2 TIMES DAILY.  12 g  2  . albuterol (VENTOLIN HFA) 108 (90 BASE) MCG/ACT inhaler Inhale 2 puffs into the lungs every 6 (six) hours as needed.        . B-D 3CC LUER-LOK SYR 18GX1-1/2 18G X 1-1/2" 3 ML MISC As directed      . escitalopram (LEXAPRO) 20 MG tablet Take 20 mg by mouth daily.        [name] Kitchen HYDROcodone-acetaminophen (NORCO) 5-325 MG per tablet Take 1 tablet by mouth every 6 (six) hours as needed.        [name] Kitchen levothyroxine (SYNTHROID, LEVOTHROID) 150 MCG tablet Take 150 mcg by mouth daily.        . ranitidine (ZANTAC) 150 MG capsule Take 150 mg by mouth 2 (two) times daily as needed.       . testosterone cypionate (DEPOTESTOTERONE CYPIONATE) 200 MG/ML injection Once every 2 weeks.      . [DISCONTINUED] fluticasone-salmeterol (ADVAIR HFA) 115-21 MCG/ACT inhaler Inhale  2 puffs into the lungs 2 (two) times daily.  12 g  11  Last reviewed on 11/08/2012  3:59 PM by Julio Sicks, NP    Review of Systems  Constitutional:   No  weight loss, night sweats,  Fevers, chills, fatigue, lassitude. HEENT:   No headaches,  Difficulty swallowing,  Tooth/dental problems,  Sore throat,                No sneezing, itching, ear ache,  +nasal congestion, post nasal drip,   CV:  No chest pain,  Orthopnea, PND, swelling in lower extremities, anasarca, dizziness, palpitations  GI  No heartburn, indigestion, abdominal pain, nausea, vomiting, diarrhea, change in bowel habits, loss of appetite  Resp: Notes  shortness of breath with exertion and at rest.     No coughing up of blood.  No chest wall deformity  Skin: no rash or lesions.  GU: no dysuria, change in color of urine, no urgency or frequency.  No flank pain.  MS:  No joint pain or swelling.  No decreased range of motion.  No back pain.  Psych:  No change in mood or affect. No depression or anxiety.  No memory loss.      Objective:   Physical Exam  Filed Vitals:   11/08/12 1545  BP: 106/74  Pulse: 75  Temp: 98.5 F (36.9 C)  TempSrc: Oral  Height: 6' 2.5" (1.892 m)  Weight: 199 lb 9.6 oz (90.538 kg)  SpO2: 98%    Gen: Pleasant, well-nourished, in no distress,  normal affect  ENT: No lesions,  mouth clear,  oropharynx clear, no postnasal drip  Neck: No JVD, no TMG, no carotid bruits  Lungs: No use of accessory muscles, no dullness to percussion, distant BS,   Cardiovascular: RRR, heart sounds normal, no murmur or gallops, no peripheral edema  Abdomen: soft and NT, no HSM,  BS normal  Musculoskeletal: No deformities, no cyanosis or clubbing  Neuro: alert, non focal  Skin: Warm, no lesions or rashes      PFT Conversion 01/04/2009 09/12/2010  FVC 5.56 4.97  FVC PREDICT 5.26 5.21  FVC  % Predicted 106 95  FEV1 3.77 3.33  FEV1 PREDICT 3.59 3.53  FEV % Predicted 105 94  FEV1/FVC 67.8 67  FEV1/FVC PRE 68 99  FeF 25-75 2.16 2.43  FeF 25-75 % Predicted 3.16 3.07  FEF % EXPEC 68 79  POST FVC 5.42   FVCPRDPST 5.26   POST FVC%EXP 103   POST FEV1 3.91   FEV1PRDPST 3.59   POSTFEV1%PRD 109   PSTFEV1/FVC 72   FEV1FVCPRDPS 68   PSTFEF25/75% 2.83   PSTFEF25/75P 3.16   FEF2575%EXPS 90   Residual Volume (ml) 3.36   RV % EXPECT 121   DLCO (ml/mmHg sec) 26.6   DLCO % EXPEC 99   DLCO/VA 3.86   DLCO/VA%EXP 105   PFT RSLT normal     FeV1 82%  Fef 25/75 70% 05/26/11  Assessment & Plan:

## 2012-11-09 ENCOUNTER — Encounter: Payer: Self-pay | Admitting: Adult Health

## 2012-11-09 ENCOUNTER — Telehealth: Payer: Self-pay | Admitting: Adult Health

## 2012-11-09 DIAGNOSIS — M159 Polyosteoarthritis, unspecified: Secondary | ICD-10-CM | POA: Diagnosis not present

## 2012-11-09 DIAGNOSIS — M2550 Pain in unspecified joint: Secondary | ICD-10-CM | POA: Diagnosis not present

## 2012-11-09 DIAGNOSIS — R931 Abnormal findings on diagnostic imaging of heart and coronary circulation: Secondary | ICD-10-CM | POA: Insufficient documentation

## 2012-11-09 DIAGNOSIS — R9389 Abnormal findings on diagnostic imaging of other specified body structures: Secondary | ICD-10-CM

## 2012-11-09 NOTE — Assessment & Plan Note (Signed)
Flare  Check xray today  Advised if hiccups not improving will need to see PCP again for evaluation   Plan  Augmentin 875mg  Twice daily  For 7 days  Mucinex DM Twice daily  As needed  Cough/congestion  Steroid taper over next week.  I will call with xray results.  Please contact office for sooner follow up if symptoms do not improve or worsen or seek emergency care

## 2012-11-09 NOTE — Telephone Encounter (Signed)
Notes Recorded by Storm Frisk, MD on 11/09/2012 at 3:21 PM Noted  Yes, he will need a CT chest non contrasted To evaluate further   pw ------  Notes Recorded by Julio Sicks, NP on 11/09/2012 at 2:43 PM Abnormal nodularity in RUL ?new  Will need CT chest to evaluate .  LMOMTCB 11/09/2012   I transferred call to TP.Carron Curie, CMA

## 2012-11-09 NOTE — Telephone Encounter (Signed)
Spoke w/ pt  CT to be ordered

## 2012-11-10 NOTE — Progress Notes (Signed)
Quick Note:  Per phone msg from 11/09/12, pt aware of cxr results and ct has been ordered. ______

## 2012-11-10 NOTE — Telephone Encounter (Signed)
Pt aware he will be contacted about CT appt.

## 2012-11-11 NOTE — Progress Notes (Signed)
Quick Note:  Pt aware per 12.10.13 phone note. ______

## 2012-11-12 ENCOUNTER — Ambulatory Visit (INDEPENDENT_AMBULATORY_CARE_PROVIDER_SITE_OTHER)
Admission: RE | Admit: 2012-11-12 | Discharge: 2012-11-12 | Disposition: A | Discharge status: Home / Self Care | Payer: Medicare Other | Source: Ambulatory Visit | Attending: Adult Health | Admitting: Adult Health

## 2012-11-12 DIAGNOSIS — R918 Other nonspecific abnormal finding of lung field: Secondary | ICD-10-CM | POA: Diagnosis not present

## 2012-11-12 DIAGNOSIS — R9389 Abnormal findings on diagnostic imaging of other specified body structures: Secondary | ICD-10-CM

## 2012-11-15 ENCOUNTER — Encounter: Payer: Self-pay | Admitting: Adult Health

## 2012-11-16 NOTE — Progress Notes (Signed)
Quick Note:  Called, spoke with pt's spouse, Nella. States they did receive the msg from PW yesterday. They are aware of CT Chest results and recs and had no further questions or concerns at this time. ______

## 2012-11-19 NOTE — Progress Notes (Signed)
Quick Note:  Pt aware of CT results. Called spoke with patient's spouse; 2 week follow up scheduled w/ PW for 1.7.13 @ 0945 w/ cxr. ______

## 2012-12-07 ENCOUNTER — Ambulatory Visit (INDEPENDENT_AMBULATORY_CARE_PROVIDER_SITE_OTHER): Payer: BC Managed Care – PPO | Admitting: Critical Care Medicine

## 2012-12-07 ENCOUNTER — Ambulatory Visit (INDEPENDENT_AMBULATORY_CARE_PROVIDER_SITE_OTHER)
Admission: RE | Admit: 2012-12-07 | Discharge: 2012-12-07 | Disposition: A | Discharge status: Home / Self Care | Payer: BC Managed Care – PPO | Source: Ambulatory Visit | Attending: Critical Care Medicine | Admitting: Critical Care Medicine

## 2012-12-07 ENCOUNTER — Encounter: Payer: Self-pay | Admitting: Critical Care Medicine

## 2012-12-07 VITALS — BP 132/96 | HR 70 | Temp 97.8°F | Ht 74.0 in | Wt 188.0 lb

## 2012-12-07 DIAGNOSIS — J449 Chronic obstructive pulmonary disease, unspecified: Secondary | ICD-10-CM

## 2012-12-07 DIAGNOSIS — J4489 Other specified chronic obstructive pulmonary disease: Secondary | ICD-10-CM

## 2012-12-07 NOTE — Assessment & Plan Note (Signed)
Copd Golds A  Asthmatic bronchitis improved with ABX and steroids Plan Cont advair Note RUL infiltrate has cleared on CXR

## 2012-12-07 NOTE — Patient Instructions (Addendum)
I will call xray report to you Stay on Advair Return 4 months

## 2012-12-07 NOTE — Progress Notes (Signed)
Subjective:    Patient ID: [name], male    DOB: Feb 14, 1946, 67 y.o.   MRN: [id number]  HPI  66 y.o. [name]   08/26/11 Dyspnea is the same.  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  Cough is minimal and no mucus  02/24/12 No changes, Dyspnea is the same.  Notes some pn drip.     08/27/2012 No real changes since 3/13.  No real cough.  Pt notes some dyspnea but is stable.   No real edema.  No heartburn.  Pt uses zantac prn.  12/9 Acute OV  Complains of productive cough with green mucus, increased SOB, wheezing, increased fatigue x3 weeks. No otc used .  Leaving for [place] in 1 week for Christmas.  No fever, hemoptysis , weight loss, or edema.   Seen by PCP for recurrent hiccups and GERD last week.  Started on Zantac.  Has had almost constant hiccups for last 3 weeks. No known injury or recent surgeries .    12/07/2012 Pt seen by TP with cough and dyspnea.  Pt notes 30# and no real reason.  appt is poor.  Eats as much as can. Pt will soak through the bedclothes.  No fever.  Notes some joint pain. No headaches. No heartburn now.  Zantac helped.  Hiccups with GERD>>>pain med issue.  Rx morphine to rx arthritis instead of hydrocodone.  Morphine ppt hiccups.     Past Medical History  Diagnosis Date  . Anxiety state, unspecified   . Chronic airway obstruction, not elsewhere classified   . Depressive disorder, not elsewhere classified   . Hypothyroidism      Family History  Problem Relation Age of Onset  . Prostate cancer Father   . Lung cancer Father   . Allergies Sister      History   Social History  . Marital Status: Married    Spouse Name: N/A    Number of Children: N/A  . Years of Education: N/A   Occupational History  . Not on file.   Social History Main Topics  . Smoking status: Former Smoker -- 2.0 packs/day for 40 years    Types: Cigarettes    Quit date: 12/01/2001  . Smokeless tobacco: Never Used   Comment: 2ppd x 43 years.  Still uses nicotine gum.  [name] Kitchen Alcohol Use: Not on file  . Drug Use: Not on file  . Sexually Active: Not on file   Other Topics Concern  . Not on file   Social History Narrative  . No narrative on file     Allergies  Allergen Reactions  . Doxycycline     REACTION: nausea  . Guaifenesin      Outpatient Prescriptions Prior to Visit  Medication Sig Dispense Refill  . ADVAIR HFA 115-21 MCG/ACT inhaler INHALE 2 PUFFS INTO THE LUNGS 2 TIMES DAILY.  12 g  2  . albuterol (VENTOLIN HFA) 108 (90 BASE) MCG/ACT inhaler Inhale 2 puffs into the lungs every 6 (six) hours as needed.        [name] Kitchen escitalopram (LEXAPRO) 20 MG tablet Take 20 mg by mouth daily.        [name] Kitchen levothyroxine (SYNTHROID, LEVOTHROID) 150 MCG tablet Take 150 mcg by mouth daily.        . ranitidine (ZANTAC) 150 MG capsule Take 150 mg by mouth 2 (two) times daily as needed.       . B-D 3CC LUER-LOK SYR  18GX1-1/2 18G X 1-1/2" 3 ML MISC As directed -- testosterone injections are ON HOLD      . testosterone cypionate (DEPOTESTOTERONE CYPIONATE) 200 MG/ML injection Once every 2 weeks. ON HOLD      . [DISCONTINUED] HYDROcodone-acetaminophen (NORCO) 5-325 MG per tablet Take 1 tablet by mouth every 6 (six) hours as needed.        . [DISCONTINUED] morphine (MSIR) 30 MG tablet Take 30 mg by mouth 3 (three) times daily.      . [DISCONTINUED] predniSONE (DELTASONE) 10 MG tablet 4 tabs for 2 days, then 3 tabs for 2 days, 2 tabs for 2 days, then 1 tab for 2 days, then stop  20 tablet  0  Last reviewed on 12/07/2012 10:08 AM by Raford Pitcher, RN    Review of Systems  Constitutional:   No  weight loss, night sweats,  Fevers, chills, fatigue, lassitude. HEENT:   No headaches,  Difficulty swallowing,  Tooth/dental problems,  Sore throat,                No sneezing, itching, ear ache,  No nasal congestion, post nasal drip,   CV:  No chest pain,  Orthopnea, PND, swelling in lower extremities, anasarca, dizziness,  palpitations  GI  No heartburn, indigestion, abdominal pain, nausea, vomiting, diarrhea, change in bowel habits, loss of appetite  Resp: No  shortness of breath with exertion and at rest.     No coughing up of blood.  No chest wall deformity  Skin: no rash or lesions.  GU: no dysuria, change in color of urine, no urgency or frequency.  No flank pain.  MS:  No joint pain or swelling.  No decreased range of motion.  No back pain.  Psych:  No change in mood or affect. No depression or anxiety.  No memory loss.     Objective:   Physical Exam  Filed Vitals:   12/07/12 1008  BP: 132/96  Pulse: 70  Temp: 97.8 F (36.6 C)  TempSrc: Oral  Height: 6\' 2"  (1.88 m)  Weight: 188 lb (85.276 kg)  SpO2: 99%    Gen: Pleasant, well-nourished, in no distress,  normal affect  ENT: No lesions,  mouth clear,  oropharynx clear, no postnasal drip  Neck: No JVD, no TMG, no carotid bruits  Lungs: No use of accessory muscles, no dullness to percussion, distant BS,   Cardiovascular: RRR, heart sounds normal, no murmur or gallops, no peripheral edema  Abdomen: soft and NT, no HSM,  BS normal  Musculoskeletal: No deformities, no cyanosis or clubbing  Neuro: alert, non focal  Skin: Warm, no lesions or rashes      PFT Conversion 01/04/2009 09/12/2010  FVC 5.56 4.97  FVC PREDICT 5.26 5.21  FVC  % Predicted 106 95  FEV1 3.77 3.33  FEV1 PREDICT 3.59 3.53  FEV % Predicted 105 94  FEV1/FVC 67.8 67  FEV1/FVC PRE 68 99  FeF 25-75 2.16 2.43  FeF 25-75 % Predicted 3.16 3.07  FEF % EXPEC 68 79  POST FVC 5.42   FVCPRDPST 5.26   POST FVC%EXP 103   POST FEV1 3.91   FEV1PRDPST 3.59   POSTFEV1%PRD 109   PSTFEV1/FVC 72   FEV1FVCPRDPS 68   PSTFEF25/75% 2.83   PSTFEF25/75P 3.16   FEF2575%EXPS 90   Residual Volume (ml) 3.36   RV % EXPECT 121   DLCO (ml/mmHg sec) 26.6   DLCO % EXPEC 99   DLCO/VA 3.86   DLCO/VA%EXP 105  PFT RSLT normal     FeV1 82%  Fef 25/75 70% 05/26/11  Dg Chest 2  View  12/07/2012  *RADIOLOGY REPORT*  Clinical Data: Abnormal chest x-ray.  CHEST - 2 VIEW  Comparison: 11/08/2012  Findings: The previously seen nodular airspace disease in the right upper lobe has resolved.  I see no focal opacity currently. Hyperinflation of the lungs.  Heart is normal size.  No effusions or acute bony abnormality.  IMPRESSION: COPD.  Resolution of the right upper lobe density.  No acute findings.   Original Report Authenticated By: Charlett Nose, M.D.      Assessment & Plan:   COPD, Gold A wiht asthmatic bronchitic component Copd Golds A  Asthmatic bronchitis improved with ABX and steroids Plan Cont advair Note RUL infiltrate has cleared on CXR   Updated Medication List Outpatient Encounter Prescriptions as of 12/07/2012  Medication Sig Dispense Refill  . ADVAIR HFA 115-21 MCG/ACT inhaler INHALE 2 PUFFS INTO THE LUNGS 2 TIMES DAILY.  12 g  2  . albuterol (VENTOLIN HFA) 108 (90 BASE) MCG/ACT inhaler Inhale 2 puffs into the lungs every 6 (six) hours as needed.        [name] Kitchen escitalopram (LEXAPRO) 20 MG tablet Take 20 mg by mouth daily.        [name] Kitchen levothyroxine (SYNTHROID, LEVOTHROID) 150 MCG tablet Take 150 mcg by mouth daily.        . OXYCODONE HCL PO Take 2 tablets by mouth every 6 (six) hours. Unsure of stregnth      . ranitidine (ZANTAC) 150 MG capsule Take 150 mg by mouth 2 (two) times daily as needed.       . B-D 3CC LUER-LOK SYR 18GX1-1/2 18G X 1-1/2" 3 ML MISC As directed -- testosterone injections are ON HOLD      . testosterone cypionate (DEPOTESTOTERONE CYPIONATE) 200 MG/ML injection Once every 2 weeks. ON HOLD      . [DISCONTINUED] HYDROcodone-acetaminophen (NORCO) 5-325 MG per tablet Take 1 tablet by mouth every 6 (six) hours as needed.        . [DISCONTINUED] morphine (MSIR) 30 MG tablet Take 30 mg by mouth 3 (three) times daily.      . [DISCONTINUED] predniSONE (DELTASONE) 10 MG tablet 4 tabs for 2 days, then 3 tabs for 2 days, 2 tabs for 2 days, then 1 tab for 2  days, then stop  20 tablet  0

## 2012-12-10 DIAGNOSIS — B0052 Herpesviral keratitis: Secondary | ICD-10-CM | POA: Diagnosis not present

## 2012-12-10 DIAGNOSIS — H52209 Unspecified astigmatism, unspecified eye: Secondary | ICD-10-CM | POA: Diagnosis not present

## 2012-12-10 DIAGNOSIS — H25019 Cortical age-related cataract, unspecified eye: Secondary | ICD-10-CM | POA: Diagnosis not present

## 2012-12-10 DIAGNOSIS — H524 Presbyopia: Secondary | ICD-10-CM | POA: Diagnosis not present

## 2013-01-03 ENCOUNTER — Other Ambulatory Visit: Payer: Self-pay | Admitting: Critical Care Medicine

## 2013-01-15 ENCOUNTER — Emergency Department (HOSPITAL_COMMUNITY): Payer: Medicare Other

## 2013-01-15 ENCOUNTER — Emergency Department (HOSPITAL_COMMUNITY)
Admission: EM | Admit: 2013-01-15 | Discharge: 2013-01-15 | Disposition: A | Discharge status: Home / Self Care | Payer: Medicare Other | Attending: Emergency Medicine | Admitting: Emergency Medicine

## 2013-01-15 ENCOUNTER — Encounter (HOSPITAL_COMMUNITY): Payer: Self-pay | Admitting: *Deleted

## 2013-01-15 DIAGNOSIS — Z8659 Personal history of other mental and behavioral disorders: Secondary | ICD-10-CM | POA: Insufficient documentation

## 2013-01-15 DIAGNOSIS — R1110 Vomiting, unspecified: Secondary | ICD-10-CM | POA: Diagnosis not present

## 2013-01-15 DIAGNOSIS — E039 Hypothyroidism, unspecified: Secondary | ICD-10-CM | POA: Insufficient documentation

## 2013-01-15 DIAGNOSIS — Z87891 Personal history of nicotine dependence: Secondary | ICD-10-CM | POA: Insufficient documentation

## 2013-01-15 DIAGNOSIS — R109 Unspecified abdominal pain: Secondary | ICD-10-CM | POA: Diagnosis not present

## 2013-01-15 DIAGNOSIS — R112 Nausea with vomiting, unspecified: Secondary | ICD-10-CM | POA: Insufficient documentation

## 2013-01-15 DIAGNOSIS — J449 Chronic obstructive pulmonary disease, unspecified: Secondary | ICD-10-CM | POA: Insufficient documentation

## 2013-01-15 DIAGNOSIS — R197 Diarrhea, unspecified: Secondary | ICD-10-CM

## 2013-01-15 DIAGNOSIS — N281 Cyst of kidney, acquired: Secondary | ICD-10-CM | POA: Diagnosis not present

## 2013-01-15 DIAGNOSIS — J4489 Other specified chronic obstructive pulmonary disease: Secondary | ICD-10-CM | POA: Insufficient documentation

## 2013-01-15 DIAGNOSIS — Z79899 Other long term (current) drug therapy: Secondary | ICD-10-CM | POA: Insufficient documentation

## 2013-01-15 DIAGNOSIS — Z8739 Personal history of other diseases of the musculoskeletal system and connective tissue: Secondary | ICD-10-CM | POA: Insufficient documentation

## 2013-01-15 DIAGNOSIS — R079 Chest pain, unspecified: Secondary | ICD-10-CM | POA: Diagnosis not present

## 2013-01-15 HISTORY — DX: Unspecified osteoarthritis, unspecified site: M19.90

## 2013-01-15 LAB — URINALYSIS, ROUTINE W REFLEX MICROSCOPIC
Hgb urine dipstick: NEGATIVE
Nitrite: NEGATIVE
Specific Gravity, Urine: 1.024 (ref 1.005–1.030)
Urobilinogen, UA: 0.2 mg/dL (ref 0.0–1.0)
pH: 8 (ref 5.0–8.0)

## 2013-01-15 LAB — COMPREHENSIVE METABOLIC PANEL
ALT: 18 U/L (ref 0–53)
Albumin: 3.8 g/dL (ref 3.5–5.2)
Alkaline Phosphatase: 33 U/L — ABNORMAL LOW (ref 39–117)
Chloride: 104 mEq/L (ref 96–112)
Glucose, Bld: 141 mg/dL — ABNORMAL HIGH (ref 70–99)
Potassium: 4.6 mEq/L (ref 3.5–5.1)
Sodium: 140 mEq/L (ref 135–145)
Total Protein: 7.1 g/dL (ref 6.0–8.3)

## 2013-01-15 LAB — CBC
HCT: 39.9 % (ref 39.0–52.0)
RBC: 4.57 MIL/uL (ref 4.22–5.81)
RDW: 13.6 % (ref 11.5–15.5)
WBC: 13.3 10*3/uL — ABNORMAL HIGH (ref 4.0–10.5)

## 2013-01-15 LAB — POCT I-STAT TROPONIN I: Troponin i, poc: 0 ng/mL (ref 0.00–0.08)

## 2013-01-15 MED ORDER — SODIUM CHLORIDE 0.9 % IV BOLUS (SEPSIS)
1000.0000 mL | Freq: Once | INTRAVENOUS | Status: AC
  Administered 2013-01-15: 1000 mL via INTRAVENOUS

## 2013-01-15 MED ORDER — ONDANSETRON HCL 4 MG/2ML IJ SOLN
4.0000 mg | Freq: Once | INTRAMUSCULAR | Status: AC
  Administered 2013-01-15: 4 mg via INTRAVENOUS
  Filled 2013-01-15: qty 2

## 2013-01-15 MED ORDER — ONDANSETRON HCL 4 MG PO TABS: 4.0000 mg | ORAL_TABLET | Freq: Three times a day (TID) | ORAL | Status: DC | PRN

## 2013-01-15 MED ORDER — MORPHINE SULFATE 4 MG/ML IJ SOLN
4.0000 mg | Freq: Once | INTRAMUSCULAR | Status: AC
  Administered 2013-01-15: 4 mg via INTRAVENOUS
  Filled 2013-01-15: qty 1

## 2013-01-15 MED ORDER — IOHEXOL 300 MG/ML  SOLN
100.0000 mL | Freq: Once | INTRAMUSCULAR | Status: AC | PRN
  Administered 2013-01-15: 100 mL via INTRAVENOUS

## 2013-01-15 MED ORDER — IOHEXOL 300 MG/ML  SOLN
50.0000 mL | Freq: Once | INTRAMUSCULAR | Status: AC | PRN
  Administered 2013-01-15: 50 mL via ORAL

## 2013-01-15 NOTE — ED Notes (Signed)
Patient transported to CT 

## 2013-01-15 NOTE — ED Notes (Signed)
Provider at the bedside.  

## 2013-01-15 NOTE — ED Provider Notes (Signed)
Medical screening examination/treatment/procedure(s) were performed by non-physician practitioner and as supervising physician I was immediately available for consultation/collaboration.  Juliet Rude. Rubin Payor, MD 01/15/13 [phone]

## 2013-01-15 NOTE — ED Notes (Signed)
Pt woke up this am with n/v/d and reports mid chest pain that started after vomiting approx 12 times this morning. Received 2 nitro, zofran 4mg  and ASA 324mg  pta.

## 2013-01-15 NOTE — ED Notes (Signed)
Patient transported to X-ray 

## 2013-01-15 NOTE — ED Provider Notes (Signed)
History     CSN: [id number]  Arrival date & time 01/15/13  4098   First MD Initiated Contact with Patient 01/15/13 [phone]      Chief Complaint  Patient presents with  . Emesis  . Diarrhea  . Chest Pain    (Consider location/radiation/quality/duration/timing/severity/associated sxs/prior treatment) The history is provided by the patient and the spouse. No language interpreter was used.   67 year old male with a past medical history of COPD, hypothyroidism and osteoarthritis presents emergency department with chief complaint of nausea, vomiting, diarrhea, and chest pain.  Patient states that he awoke last night from sleep with sudden onset nausea and vomiting.  He had several episodes of vomiting non-bilious nonbloody emesis.  He also had several episodes of loose stool and tenesmus.  Patient denies any hematochezia or melena.  Patient complains of heavy chest pain with pressure.  It is localized substernally.  He denies any diaphoresis. He has no history of heart disease.  He does have a history of smoking.  No diabetes or hypertension. No FH of MI. Denies ingestion of suspect foods or water, history of similar sxs, recent foreign travel . Grandchildren with similar sxs and direct contact this weekend.  Past Medical History  Diagnosis Date  . Anxiety state, unspecified   . Chronic airway obstruction, not elsewhere classified   . Depressive disorder, not elsewhere classified   . Hypothyroidism   . Arthritis     Past Surgical History  Procedure Laterality Date  . Shoulder surgery    . Abdominal surgery  1971    terminal ileum removed, ileitis     Family History  Problem Relation Age of Onset  . Prostate cancer Father   . Lung cancer Father   . Allergies Sister     History  Substance Use Topics  . Smoking status: Former Smoker -- 2.00 packs/day for 40 years    Types: Cigarettes    Quit date: 12/01/2001  . Smokeless tobacco: Never Used     Comment: 2ppd x 43 years.  Still  uses nicotine gum.  [name] Kitchen Alcohol Use: No      Review of Systems Ten systems reviewed and are negative for acute change, except as noted in the HPI.   Allergies  Doxycycline and Guaifenesin  Home Medications   Current Outpatient Rx  Name  Route  Sig  Dispense  Refill  . ADVAIR HFA 115-21 MCG/ACT inhaler      INHALE 2 PUFFS INTO THE LUNGS 2 TIMES DAILY.   1 Inhaler   6   . albuterol (VENTOLIN HFA) 108 (90 BASE) MCG/ACT inhaler   Inhalation   Inhale 2 puffs into the lungs every 6 (six) hours as needed.           . B-D 3CC LUER-LOK SYR 18GX1-1/2 18G X 1-1/2" 3 ML MISC      As directed -- testosterone injections are ON HOLD         . escitalopram (LEXAPRO) 20 MG tablet   Oral   Take 20 mg by mouth daily.           [name] Kitchen levothyroxine (SYNTHROID, LEVOTHROID) 150 MCG tablet   Oral   Take 150 mcg by mouth daily.           . OXYCODONE HCL PO   Oral   Take 2 tablets by mouth every 6 (six) hours. Unsure of stregnth         . ranitidine (ZANTAC) 150 MG capsule   Oral  Take 150 mg by mouth 2 (two) times daily as needed.          . testosterone cypionate (DEPOTESTOTERONE CYPIONATE) 200 MG/ML injection      Once every 2 weeks. ON HOLD           BP 125/74  Pulse 60  Temp(Src) 97.7 F (36.5 C) (Oral)  Resp 20  SpO2 98%  Physical Exam  Physical Exam  Nursing note and vitals reviewed. Constitutional: He appears well-developed and well-nourished.  No clinical dehydration.  Appears very uncomfortable. HENT:  Head: Normocephalic and atraumatic.  Eyes: Conjunctivae normal are normal. No scleral icterus.  Neck: Normal range of motion. Neck supple.  Cardiovascular: Normal rate, regular rhythm and normal heart sounds.   Pulmonary/Chest: Effort normal and breath sounds normal. No respiratory distress.  Abdominal: Soft. BS normal.  Tender to palpation of bilateral upper quadrants and epigastrium. TTP RLQ.  Musculoskeletal: He exhibits no edema.  Neurological: He is  alert.  Skin: Skin is warm and dry. He is not diaphoretic.  Psychiatric: His behavior is normal.    ED Course  Procedures (including critical care time)     Labs Reviewed  CBC  URINALYSIS, ROUTINE W REFLEX MICROSCOPIC  COMPREHENSIVE METABOLIC PANEL  LIPASE, BLOOD   Ct Abdomen Pelvis W Contrast  01/15/2013  *RADIOLOGY REPORT*  Clinical Data: Abdominal pain and vomiting.  Post appendectomy.  CT ABDOMEN AND PELVIS WITH CONTRAST  Technique:  Multidetector CT imaging of the abdomen and pelvis was performed following the standard protocol during bolus administration of intravenous contrast.  Contrast: OMNIPAQUE IOHEXOL 300 MG/ML  SOLN  Comparison: Chest CT performed 11/12/2012  Findings: Stable left lower lobe pulmonary nodule.  Calcified granulomata in the spleen.  Liver, gallbladder, pancreas, adrenal glands are within normal limits.  Benign-appearing cysts are present in both kidneys.  Mild atherosclerotic changes of the infrarenal aorta.  Moderate hiatal hernia is noted.  Postoperative changes in the abdomen are noted.  No disproportionate dilatation of bowel to suggest obstruction.  The prostate gland is enlarged.  Mild bladder wall thickening is noted.  No evidence of abnormal adenopathy.  No free fluid.  IMPRESSION: No acute intra-abdominal pathology.  Prostate enlargement.  Correlate with physical exam and PSA.  Stable left lower lobe pulmonary nodule consistent with a benign process.  Hiatal hernia.  Simple cyst in the kidneys.   Original Report Authenticated By: Jolaine Click, M.D.    Dg Abd Acute W/chest  01/15/2013  *RADIOLOGY REPORT*  Clinical Data: Chest pain.  Vomiting.  ACUTE ABDOMEN SERIES (ABDOMEN 2 VIEW & CHEST 1 VIEW)  Comparison: Chest x-ray 12/07/2012.  Findings: Lungs appear mildly hyperexpanded with some pruning of the pulmonary vasculature in the periphery, suggestive of underlying COPD.  No acute consolidative air space disease.  No pleural effusions.  Ill-defined nodular  opacities are noted in the lower lungs bilaterally, symmetric in appearance, favored to represent prominent nipple shadows.  No evidence of pulmonary edema.  Heart size is normal.  Mediastinal contours are unremarkable.  Atherosclerosis in the thoracic aorta.  Supine and upright views of the abdomen demonstrates gas and stool scattered throughout the colon.  There are some borderline to mildly dilated loops of small bowel (measuring up to 3.5 cm in diameter) noted in the central abdomen and right lower quadrant.  A few air-fluid levels are noted in this region.  Multiple pelvic phleboliths are noted.  Surgical clips are noted throughout the right lower quadrant of the abdomen.  IMPRESSION: 1.  Nonspecific bowel gas pattern, as above, which could suggest early or partial small bowel obstruction.  Further evaluation with CT of the abdomen and pelvis may be warranted if clinically indicated. 2.  No pneumoperitoneum. 3.  Changes suggestive of COPD, as above, without radiographic evidence of acute cardiopulmonary disease. 4.  Atherosclerosis.   Original Report Authenticated By: Trudie Reed, M.D.      1. Nausea and vomiting in adult   2. Diarrhea     Date: 01/15/2013  Rate: 58  Rhythm: normal sinus rhythm  QRS Axis: normal  Intervals: normal  ST/T Wave abnormalities: normal  Conduction Disutrbances: none  Narrative Interpretation:   Old EKG Reviewed: None available      MDM  9:47 AM Patient with N/V/D. May represent gastroenteritis, however worst complaint is Chest pain at this time.  WIll work up for ACS. Wells Criteria negative and I do not suspect PE as cause of his chest pain.     11:10 AM BP 133/90  Pulse 57  Temp(Src) 97.7 F (36.5 C) (Oral)  Resp 18  SpO2 100% Patient updated on labs and continued workup.  He is in agreement with plan.  He denies any chest pain or vomiting or nausea currently.  Patient does have a history of ileitis with previous removal of a portion of his  large and small bowel.  He had a colonoscopy last year which showed some ulceration junction of the large and small bowel.   Filed Vitals:   01/15/13 1000 01/15/13 1130 01/15/13 1140 01/15/13 1145  BP: 137/73 105/62 123/71 123/71  Pulse: 59 67 67 65  Temp:      TempSrc:      Resp: 24 15 17 17   SpO2: 90% 95% 100% 100%   Patient's pain is resolved.  2 negative troponins and ECG unremarkable. No clinical suspicion for PE. Patient with symptoms consistent with gastroenteritis.  Vitals are stable, no fever.  No signs of dehydration, tolerating PO fluids.  Lungs are clear.  No focal abdominal pain, no concern for appendicitis, cholecystitis, pancreatitis, ruptured viscus, UTI, kidney Benish, or any other abdominal etiology.  Supportive therapy indicated with return if symptoms worsen.  Patient counseled.    Arthor Captain, PA-C 01/15/13 1635

## 2013-04-01 DIAGNOSIS — E291 Testicular hypofunction: Secondary | ICD-10-CM | POA: Diagnosis not present

## 2013-04-01 DIAGNOSIS — R972 Elevated prostate specific antigen [PSA]: Secondary | ICD-10-CM | POA: Diagnosis not present

## 2013-04-01 DIAGNOSIS — N400 Enlarged prostate without lower urinary tract symptoms: Secondary | ICD-10-CM | POA: Diagnosis not present

## 2013-05-05 ENCOUNTER — Telehealth: Payer: Self-pay | Admitting: Critical Care Medicine

## 2013-05-05 MED ORDER — ALBUTEROL SULFATE HFA 108 (90 BASE) MCG/ACT IN AERS: 2.0000 | INHALATION_SPRAY | Freq: Four times a day (QID) | RESPIRATORY_TRACT | Status: DC | PRN

## 2013-05-05 NOTE — Telephone Encounter (Signed)
[id number] x1 

## 2013-05-05 NOTE — Telephone Encounter (Signed)
Patient returning call.

## 2013-05-05 NOTE — Telephone Encounter (Signed)
Spoke with pt's wife.  Pt needs rx for ventolin hfa sent to [facility] on [address].  Rx sent.  Wife aware and voiced no further questions or concerns at this time.

## 2013-05-16 ENCOUNTER — Encounter: Payer: Self-pay | Admitting: Internal Medicine

## 2013-05-16 ENCOUNTER — Ambulatory Visit (INDEPENDENT_AMBULATORY_CARE_PROVIDER_SITE_OTHER)
Admission: RE | Admit: 2013-05-16 | Discharge: 2013-05-16 | Disposition: A | Discharge status: Home / Self Care | Payer: Medicare Other | Source: Ambulatory Visit | Attending: Internal Medicine | Admitting: Internal Medicine

## 2013-05-16 ENCOUNTER — Ambulatory Visit (INDEPENDENT_AMBULATORY_CARE_PROVIDER_SITE_OTHER): Payer: Medicare Other | Admitting: Internal Medicine

## 2013-05-16 VITALS — BP 110/64 | HR 62 | Temp 99.1°F | Ht 75.0 in | Wt 186.0 lb

## 2013-05-16 DIAGNOSIS — R634 Abnormal weight loss: Secondary | ICD-10-CM | POA: Diagnosis not present

## 2013-05-16 DIAGNOSIS — R9389 Abnormal findings on diagnostic imaging of other specified body structures: Secondary | ICD-10-CM

## 2013-05-16 DIAGNOSIS — J449 Chronic obstructive pulmonary disease, unspecified: Secondary | ICD-10-CM

## 2013-05-16 DIAGNOSIS — M79609 Pain in unspecified limb: Secondary | ICD-10-CM | POA: Diagnosis not present

## 2013-05-16 DIAGNOSIS — R918 Other nonspecific abnormal finding of lung field: Secondary | ICD-10-CM | POA: Diagnosis not present

## 2013-05-16 DIAGNOSIS — M1990 Unspecified osteoarthritis, unspecified site: Secondary | ICD-10-CM | POA: Diagnosis not present

## 2013-05-16 DIAGNOSIS — E039 Hypothyroidism, unspecified: Secondary | ICD-10-CM | POA: Diagnosis not present

## 2013-05-16 DIAGNOSIS — J4489 Other specified chronic obstructive pulmonary disease: Secondary | ICD-10-CM

## 2013-05-16 MED ORDER — PREDNISONE (PAK) 10 MG PO TABS: ORAL_TABLET | ORAL | Status: DC

## 2013-05-16 NOTE — Assessment & Plan Note (Signed)
?   Early bronchopna on L > Rx levquin x 5 days and f/u in 2 weeks.

## 2013-05-16 NOTE — Assessment & Plan Note (Signed)
GOLD I well controlled at baseline with mile aecopd with active wheeze and ? Early bronchopna   rec  Prednisone 10 mg take  4 each am x 2 days,   2 each am x 2 days,  1 each am x 2 days and stop Levaquin 750 qd x 5 days    Each maintenance medication was reviewed in detail including most importantly the difference between maintenance and as needed and under what circumstances the prns are to be used.  Please see instructions for details which were reviewed in writing and the patient given a copy.

## 2013-05-16 NOTE — Progress Notes (Signed)
Subjective:    Patient ID: [name], male    DOB: 07-19-1946    MRN: [id number]  HPI  67 y.o. [name] quit smoking 2003  08/26/11 Dyspnea is the same.  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  Cough is minimal and no mucus  02/24/12 No changes, Dyspnea is the same.  Notes some pn drip.     08/27/2012 No real changes since 3/13.  No real cough.  Pt notes some dyspnea but is stable.   No real edema.  No heartburn.  Pt uses zantac prn.  12/9 Acute OV  Complains of productive cough with green mucus, increased SOB, wheezing, increased fatigue x3 weeks. No otc used .  Leaving for [place] in 1 week for Christmas.  No fever, hemoptysis , weight loss, or edema.   Seen by PCP for recurrent hiccups and GERD last week.  Started on Zantac.  Has had almost constant hiccups for last 3 weeks. No known injury or recent surgeries .    12/07/2012 Pt seen by TP with cough and dyspnea.  Pt notes 30# and no real reason.  appt is poor.  Eats as much as can. Pt will soak through the bedclothes.  No fever.  Notes some joint pain. No headaches. No heartburn now.  Zantac helped.  Hiccups with GERD>>>pain med issue.  Rx morphine to rx arthritis instead of hydrocodone.  Morphine ppt hiccups. rec No change rx   05/16/2013 acute ov/[name]  Chief Complaint  Patient presents with  . Acute Visit    Reports having chest pain, SOB, chest tightness, wheezing, coughing with no production of mucus x 1 week. Wife reports pt having a fever/chills and body aches last week.  temp 100.5, chills, resolved p several days but breathing worse dryhacking cough with pain over center of chest, now need saba sev times a day vs no need at baseline.  No obvious daytime variabilty or assoc   chest tightness, subjective wheeze overt sinus or hb symptoms. No unusual exp hx or h/o childhood pna/ asthma or knowledge of premature birth.   Sleeping ok without nocturnal  or early am exacerbation   of respiratory  c/o's or need for noct saba. Also denies any obvious fluctuation of symptoms with weather or environmental changes or other aggravating or alleviating factors except as outlined above   Sleeping ok without nocturnal  or early am exacerbation  of respiratory  c/o's or need for noct saba. Also denies any obvious fluctuation of symptoms with weather or environmental changes or other aggravating or alleviating factors except as outlined above.    Past Medical History  Diagnosis Date  . Anxiety state, unspecified   . Chronic airway obstruction, not elsewhere classified   . Depressive disorder, not elsewhere classified   . Hypothyroidism      Family History  Problem Relation Age of Onset  . Prostate cancer Father   . Lung cancer Father   . Allergies Sister      History   Social History  . Marital Status: Married    Spouse Name: N/A    Number of Children: N/A  . Years of Education: N/A   Occupational History  . Not on file.   Social History Main Topics  . Smoking status: Former Smoker -- 2.0 packs/day for 40 years    Types: Cigarettes    Quit date: 12/01/2001  . Smokeless tobacco: Never Used     Comment:  2ppd x 43 years.  Still uses nicotine gum.  [name] Kitchen Alcohol Use: Not on file  . Drug Use: Not on file  . Sexually Active: Not on file   Other Topics Concern  . Not on file   Social History Narrative  . No narrative on file     Allergies  Allergen Reactions  . Doxycycline     REACTION: nausea  . Guaifenesin          .     Objective:   Physical Exam   Gen: amb elderly wm chronically but not acutely  ill appearing    ENT: No lesions,  mouth clear,  oropharynx clear, no postnasal drip  Neck: No JVD, no TMG, no carotid bruits  Lungs: No use of accessory muscles, no dullness to percussion, distant BS,   Cardiovascular: RRR, heart sounds normal, no murmur or gallops, no peripheral edema  Abdomen: soft and NT, no HSM,  BS  normal  Musculoskeletal: No deformities, no cyanosis or clubbing  Neuro: alert, non focal  Skin: Warm, no lesions or rashes      PFT Conversion 01/04/2009 09/12/2010  FVC 5.56 4.97  FVC PREDICT 5.26 5.21  FVC  % Predicted 106 95  FEV1 3.77 3.33  FEV1 PREDICT 3.59 3.53  FEV % Predicted 105 94  FEV1/FVC 67.8 67  FEV1/FVC PRE 68 99  FeF 25-75 2.16 2.43  FeF 25-75 % Predicted 3.16 3.07  FEF % EXPEC 68 79  POST FVC 5.42   FVCPRDPST 5.26   POST FVC%EXP 103   POST FEV1 3.91   FEV1PRDPST 3.59   POSTFEV1%PRD 109   PSTFEV1/FVC 72   FEV1FVCPRDPS 68   PSTFEF25/75% 2.83   PSTFEF25/75P 3.16   FEF2575%EXPS 90   Residual Volume (ml) 3.36   RV % EXPECT 121   DLCO (ml/mmHg sec) 26.6   DLCO % EXPEC 99   DLCO/VA 3.86   DLCO/VA%EXP 105   PFT RSLT normal     FeV1 82%  Fef 25/75 70% 05/26/11  CXR  05/16/2013 :   Chronic obstructive pulmonary disease. New mild left basilar opacity concerning for pneumonia, subsegmental elective cyst or scarring with probable associated mild left pleural effusion    Assessment & Plan:

## 2013-05-16 NOTE — Patient Instructions (Addendum)
Take delsym two tsp every 12 hours and supplement if needed with percocet 1 every 4 hours to suppress the urge to cough. Swallowing water or using ice chips/non mint and menthol containing candies (such as lifesavers or sugarless jolly ranchers) are also effective.  You should rest your voice and avoid activities that you know make you cough.  Once you have eliminated the cough for 3 straight days percocet,  then the delsym as tolerated.    Please remember to go to the x-ray department downstairs for your tests - we will call you with the results when they are available.  Try prilosec 20mg   Take 30-60 min before first meal of the day (or Zantac 150 after breakfast)  and Zantac one bedtime until cough is completely gone for at least a week without the need for cough suppression  Prednisone 10 mg take  4 each am x 2 days,   2 each am x 2 days,  1 each am x 2 days and stop   Call if not improving by end of week Late add : levquin 750 x 5 days and f/u cxr in 2 weeks

## 2013-05-17 ENCOUNTER — Other Ambulatory Visit: Payer: Self-pay | Admitting: Internal Medicine

## 2013-05-17 MED ORDER — LEVOFLOXACIN 750 MG PO TABS: 750.0000 mg | ORAL_TABLET | Freq: Every day | ORAL | Status: DC

## 2013-05-17 NOTE — Progress Notes (Signed)
Quick Note:  Spoke with pt and notified of results per Dr. Wert. Pt verbalized understanding and denied any questions.  ______ 

## 2013-05-18 ENCOUNTER — Other Ambulatory Visit: Payer: Self-pay | Admitting: Family Medicine

## 2013-05-18 ENCOUNTER — Ambulatory Visit
Admission: RE | Admit: 2013-05-18 | Discharge: 2013-05-18 | Disposition: A | Discharge status: Home / Self Care | Payer: Medicare Other | Source: Ambulatory Visit | Attending: Family Medicine | Admitting: Family Medicine

## 2013-05-18 DIAGNOSIS — M1990 Unspecified osteoarthritis, unspecified site: Secondary | ICD-10-CM

## 2013-05-18 DIAGNOSIS — M19049 Primary osteoarthritis, unspecified hand: Secondary | ICD-10-CM | POA: Diagnosis not present

## 2013-06-08 ENCOUNTER — Ambulatory Visit (INDEPENDENT_AMBULATORY_CARE_PROVIDER_SITE_OTHER)
Admission: RE | Admit: 2013-06-08 | Discharge: 2013-06-08 | Disposition: A | Discharge status: Home / Self Care | Payer: Medicare Other | Source: Ambulatory Visit | Attending: Critical Care Medicine | Admitting: Critical Care Medicine

## 2013-06-08 ENCOUNTER — Ambulatory Visit (INDEPENDENT_AMBULATORY_CARE_PROVIDER_SITE_OTHER): Payer: Medicare Other | Admitting: Critical Care Medicine

## 2013-06-08 ENCOUNTER — Encounter: Payer: Self-pay | Admitting: Critical Care Medicine

## 2013-06-08 VITALS — BP 148/90 | HR 57 | Temp 98.2°F | Ht 75.0 in | Wt 192.6 lb

## 2013-06-08 DIAGNOSIS — J449 Chronic obstructive pulmonary disease, unspecified: Secondary | ICD-10-CM | POA: Diagnosis not present

## 2013-06-08 DIAGNOSIS — J984 Other disorders of lung: Secondary | ICD-10-CM | POA: Diagnosis not present

## 2013-06-08 NOTE — Progress Notes (Signed)
Subjective:    Patient ID: [name], male    DOB: February 11, 1946, 67 y.o.   MRN: [id number]  HPI 67 y.o. . [name] quit smoking 2003   06/08/2013 Chief Complaint  Patient presents with  . 2 wk follow up    with CXR.  Breathing has improved and cough has resolved.  Not much wheezing,  No chest tightness or chest pain.  Pt seen 05/2013 per Wert. LLL PNA seen Rx pred/levaquin.    Now is better . Not coughing now.   Pt denies any significant sore throat, nasal congestion or excess secretions, fever, chills, sweats, unintended weight loss, pleurtic or exertional chest pain, orthopnea PND, or leg swelling Pt denies any increase in rescue therapy over baseline, denies waking up needing it or having any early am or nocturnal exacerbations of coughing/wheezing/or dyspnea. Pt also denies any obvious fluctuation in symptoms with  weather or environmental change or other alleviating or aggravating factors  Review of Systems Constitutional:   No  weight loss, night sweats,  Fevers, chills, fatigue, lassitude. HEENT:   No headaches,  Difficulty swallowing,  Tooth/dental problems,  Sore throat,                No sneezing, itching, ear ache, nasal congestion, post nasal drip,   CV:  No chest pain,  Orthopnea, PND, swelling in lower extremities, anasarca, dizziness, palpitations  GI  No heartburn, indigestion, abdominal pain, nausea, vomiting, diarrhea, change in bowel habits, loss of appetite  Resp: No shortness of breath with exertion or at rest.  No excess mucus, no productive cough,  No non-productive cough,  No coughing up of blood.  No change in color of mucus.  No wheezing.  No chest wall deformity  Skin: no rash or lesions.  GU: no dysuria, change in color of urine, no urgency or frequency.  No flank pain.  MS:  No joint pain or swelling.  No decreased range of motion.  No back pain.  Psych:  No change in mood or affect. No depression or anxiety.  No memory loss.     Objective:    Physical Exam Filed Vitals:   06/08/13 1600  BP: 148/90  Pulse: 57  Temp: 98.2 F (36.8 C)  TempSrc: Oral  Height: 6\' 3"  (1.905 m)  Weight: 192 lb 9.6 oz (87.363 kg)  SpO2: 97%    Gen: Pleasant, well-nourished, in no distress,  normal affect  ENT: No lesions,  mouth clear,  oropharynx clear, no postnasal drip  Neck: No JVD, no TMG, no carotid bruits  Lungs: No use of accessory muscles, no dullness to percussion, clear without rales or rhonchi  Cardiovascular: RRR, heart sounds normal, no murmur or gallops, no peripheral edema  Abdomen: soft and NT, no HSM,  BS normal  Musculoskeletal: No deformities, no cyanosis or clubbing  Neuro: alert, non focal  Skin: Warm, no lesions or rashes  Dg Chest 2 View  06/08/2013   *RADIOLOGY REPORT*  Clinical Data: COPD, follow up pneumonia  CHEST - 2 VIEW  Comparison: 05/16/2013  Findings: Cardiomediastinal silhouette is stable.  Improvement in aeration in the left base with no residual infiltrate.  No new segmental infiltrate or pulmonary edema.  Bony thorax is stable.  IMPRESSION: Improvement in aeration in the left base with no residual infiltrate.  No new segmental infiltrate or pulmonary edema.  Bony thorax is stable.   Original Report Authenticated By: Natasha Mead, M.D.  Assessment & Plan:

## 2013-06-08 NOTE — Patient Instructions (Addendum)
No change in medications. Return in         4 months 

## 2013-06-09 NOTE — Assessment & Plan Note (Signed)
Gold stage a COPD with asthmatic bronchitic associated symptoms stable at this time Recent left lung pneumonia resolved on current chest x-ray 06/08/2013 Plan Maintain inhaled medications as prescribed No additional antibiotics indicated

## 2013-06-15 DIAGNOSIS — M1990 Unspecified osteoarthritis, unspecified site: Secondary | ICD-10-CM | POA: Diagnosis not present

## 2013-06-15 DIAGNOSIS — Z79899 Other long term (current) drug therapy: Secondary | ICD-10-CM | POA: Diagnosis not present

## 2013-06-15 DIAGNOSIS — M2550 Pain in unspecified joint: Secondary | ICD-10-CM | POA: Diagnosis not present

## 2013-07-17 ENCOUNTER — Encounter (HOSPITAL_COMMUNITY): Payer: Self-pay | Admitting: *Deleted

## 2013-07-17 ENCOUNTER — Emergency Department (HOSPITAL_COMMUNITY): Payer: Medicare Other

## 2013-07-17 ENCOUNTER — Emergency Department (HOSPITAL_COMMUNITY)
Admission: EM | Admit: 2013-07-17 | Discharge: 2013-07-17 | Disposition: A | Discharge status: Home / Self Care | Payer: Medicare Other | Attending: Emergency Medicine | Admitting: Emergency Medicine

## 2013-07-17 DIAGNOSIS — E039 Hypothyroidism, unspecified: Secondary | ICD-10-CM | POA: Insufficient documentation

## 2013-07-17 DIAGNOSIS — Z9889 Other specified postprocedural states: Secondary | ICD-10-CM | POA: Insufficient documentation

## 2013-07-17 DIAGNOSIS — Z79899 Other long term (current) drug therapy: Secondary | ICD-10-CM | POA: Diagnosis not present

## 2013-07-17 DIAGNOSIS — IMO0002 Reserved for concepts with insufficient information to code with codable children: Secondary | ICD-10-CM | POA: Diagnosis not present

## 2013-07-17 DIAGNOSIS — J449 Chronic obstructive pulmonary disease, unspecified: Secondary | ICD-10-CM | POA: Insufficient documentation

## 2013-07-17 DIAGNOSIS — F411 Generalized anxiety disorder: Secondary | ICD-10-CM | POA: Insufficient documentation

## 2013-07-17 DIAGNOSIS — K529 Noninfective gastroenteritis and colitis, unspecified: Secondary | ICD-10-CM

## 2013-07-17 DIAGNOSIS — Z87891 Personal history of nicotine dependence: Secondary | ICD-10-CM | POA: Insufficient documentation

## 2013-07-17 DIAGNOSIS — F3289 Other specified depressive episodes: Secondary | ICD-10-CM | POA: Insufficient documentation

## 2013-07-17 DIAGNOSIS — K5289 Other specified noninfective gastroenteritis and colitis: Secondary | ICD-10-CM | POA: Diagnosis not present

## 2013-07-17 DIAGNOSIS — R072 Precordial pain: Secondary | ICD-10-CM | POA: Diagnosis not present

## 2013-07-17 DIAGNOSIS — J438 Other emphysema: Secondary | ICD-10-CM | POA: Diagnosis not present

## 2013-07-17 DIAGNOSIS — R112 Nausea with vomiting, unspecified: Secondary | ICD-10-CM | POA: Diagnosis not present

## 2013-07-17 DIAGNOSIS — R079 Chest pain, unspecified: Secondary | ICD-10-CM | POA: Insufficient documentation

## 2013-07-17 DIAGNOSIS — J4489 Other specified chronic obstructive pulmonary disease: Secondary | ICD-10-CM | POA: Insufficient documentation

## 2013-07-17 DIAGNOSIS — F329 Major depressive disorder, single episode, unspecified: Secondary | ICD-10-CM | POA: Diagnosis not present

## 2013-07-17 DIAGNOSIS — M129 Arthropathy, unspecified: Secondary | ICD-10-CM | POA: Insufficient documentation

## 2013-07-17 DIAGNOSIS — R109 Unspecified abdominal pain: Secondary | ICD-10-CM | POA: Diagnosis not present

## 2013-07-17 DIAGNOSIS — R197 Diarrhea, unspecified: Secondary | ICD-10-CM | POA: Diagnosis not present

## 2013-07-17 DIAGNOSIS — K2970 Gastritis, unspecified, without bleeding: Secondary | ICD-10-CM | POA: Diagnosis not present

## 2013-07-17 LAB — COMPREHENSIVE METABOLIC PANEL
AST: 26 U/L (ref 0–37)
Albumin: 4.5 g/dL (ref 3.5–5.2)
Alkaline Phosphatase: 33 U/L — ABNORMAL LOW (ref 39–117)
Chloride: 100 mEq/L (ref 96–112)
Creatinine, Ser: 0.92 mg/dL (ref 0.50–1.35)
Potassium: 3.9 mEq/L (ref 3.5–5.1)
Total Bilirubin: 0.4 mg/dL (ref 0.3–1.2)
Total Protein: 7.9 g/dL (ref 6.0–8.3)

## 2013-07-17 LAB — CBC WITH DIFFERENTIAL/PLATELET
Basophils Absolute: 0 10*3/uL (ref 0.0–0.1)
Basophils Relative: 0 % (ref 0–1)
MCHC: 33.6 g/dL (ref 30.0–36.0)
Neutro Abs: 14.1 10*3/uL — ABNORMAL HIGH (ref 1.7–7.7)
Neutrophils Relative %: 80 % — ABNORMAL HIGH (ref 43–77)
RDW: 14.2 % (ref 11.5–15.5)

## 2013-07-17 LAB — POCT I-STAT TROPONIN I: Troponin i, poc: 0 ng/mL (ref 0.00–0.08)

## 2013-07-17 MED ORDER — ONDANSETRON HCL 4 MG/2ML IJ SOLN
4.0000 mg | Freq: Once | INTRAMUSCULAR | Status: AC
  Administered 2013-07-17: 4 mg via INTRAVENOUS
  Filled 2013-07-17: qty 2

## 2013-07-17 MED ORDER — MORPHINE SULFATE 4 MG/ML IJ SOLN
4.0000 mg | Freq: Once | INTRAMUSCULAR | Status: AC
  Administered 2013-07-17: 4 mg via INTRAVENOUS
  Filled 2013-07-17: qty 1

## 2013-07-17 MED ORDER — ONDANSETRON HCL 8 MG PO TABS: 8.0000 mg | ORAL_TABLET | Freq: Three times a day (TID) | ORAL | Status: DC | PRN

## 2013-07-17 MED ORDER — HYDROCODONE-ACETAMINOPHEN 5-325 MG PO TABS: 1.0000 | ORAL_TABLET | ORAL | Status: DC | PRN

## 2013-07-17 NOTE — ED Provider Notes (Signed)
CSN: [id number]     Arrival date & time 07/17/13  0212 History     First MD Initiated Contact with Patient 07/17/13 0411     Chief Complaint  Patient presents with  . Emesis  . Chest Pain   (Consider location/radiation/quality/duration/timing/severity/associated sxs/prior Treatment) HPI History provided by pt.   Pt developed severe pain in the center of his abdomen w/ associated vomiting and diarrhea at 11:30pm last night.  Pain radiated into chest initially, but is now isolated to abd.  Denies fever, SOB, hematemesis/hematochezia/melena, urinary sx, testicular pain.  No known sick contacts.  Ate [place] food that was cooked at home last night.  Past abd surgeries include partial small bowel resection in 1971.  No h/o HTN, diabetes, high cholesterol or tobacco abuse.  Past Medical History  Diagnosis Date  . Anxiety state, unspecified   . Chronic airway obstruction, not elsewhere classified   . Depressive disorder, not elsewhere classified   . Hypothyroidism   . Arthritis    Past Surgical History  Procedure Laterality Date  . Shoulder surgery    . Abdominal surgery  1971    terminal ileum removed, ileitis    Family History  Problem Relation Age of Onset  . Prostate cancer Father   . Lung cancer Father   . Allergies Sister    History  Substance Use Topics  . Smoking status: Former Smoker -- 2.00 packs/day for 40 years    Types: Cigarettes    Quit date: 12/01/2001  . Smokeless tobacco: Never Used     Comment: 2ppd x 43 years.  Still uses nicotine gum.  [name] Kitchen Alcohol Use: No    Review of Systems  All other systems reviewed and are negative.    Allergies  Doxycycline and Guaifenesin  Home Medications   Current Outpatient Rx  Name  Route  Sig  Dispense  Refill  . albuterol (VENTOLIN HFA) 108 (90 BASE) MCG/ACT inhaler   Inhalation   Inhale 2 puffs into the lungs every 6 (six) hours as needed.   1 Inhaler   3   . escitalopram (LEXAPRO) 20 MG tablet   Oral   Take 20  mg by mouth daily.           . fluticasone-salmeterol (ADVAIR HFA) 115-21 MCG/ACT inhaler   Inhalation   Inhale 2 puffs into the lungs 2 (two) times daily.         [name] Kitchen levothyroxine (SYNTHROID, LEVOTHROID) 150 MCG tablet   Oral   Take 150 mcg by mouth daily.           [name] Kitchen oxyCODONE-acetaminophen (PERCOCET/ROXICET) 5-325 MG per tablet   Oral   Take 2 tablets by mouth 4 (four) times daily.          . ranitidine (ZANTAC) 150 MG capsule   Oral   Take 150 mg by mouth 2 (two) times daily as needed for heartburn (otc).           BP 141/61  Pulse 52  Resp 20  SpO2 100% Physical Exam  Nursing note and vitals reviewed. Constitutional: He is oriented to person, place, and time. He appears well-developed and well-nourished.  Uncomfortable appearing  HENT:  Head: Normocephalic and atraumatic.  Mouth/Throat: Oropharynx is clear and moist.  Eyes:  Normal appearance  Neck: Normal range of motion.  Cardiovascular: Normal rate, regular rhythm and intact distal pulses.   Pulmonary/Chest: Effort normal and breath sounds normal. No respiratory distress.  Mild tenderness over sternum  Abdominal: Soft.  Bowel sounds are normal. He exhibits no distension. There is no guarding.  Non-tender  Musculoskeletal: Normal range of motion.  No peripheral edema or calf tenderness  Neurological: He is alert and oriented to person, place, and time.  Skin: Skin is warm and dry. No rash noted.  Psychiatric: He has a normal mood and affect. His behavior is normal.    ED Course   Procedures (including critical care time)  Labs Reviewed  CBC WITH DIFFERENTIAL - Abnormal; Notable for the following:    WBC 17.7 (*)    Neutrophils Relative % 80 (*)    Neutro Abs 14.1 (*)    Lymphocytes Relative 6 (*)    Monocytes Absolute 2.2 (*)    All other components within normal limits  COMPREHENSIVE METABOLIC PANEL - Abnormal; Notable for the following:    Glucose, Bld 159 (*)    Calcium 10.8 (*)     Alkaline Phosphatase 33 (*)    GFR calc non Af Amer 84 (*)    All other components within normal limits  LIPASE, BLOOD  POCT I-STAT TROPONIN I   Dg Chest Portable 1 View  07/17/2013   *RADIOLOGY REPORT*  Clinical Data: Emesis  PORTABLE CHEST - 1 VIEW  Comparison: Prior chest x-ray 06/08/2013  Findings: Stable appearance of the chest with background pulmonary hyperexpansion, changes of COPD and emphysema.  No pulmonary edema, focal airspace consolidation, pleural effusion or pneumothorax. Cardiac and mediastinal contours within normal limits.  No acute osseous abnormality.  IMPRESSION: No acute cardiopulmonary disease.   Original Report Authenticated By: Malachy Moan, M.D.   1. Gastroenteritis     MDM  [phone] M presents w/ abd pain, N/V/D since 11:30pm last night.  Pain initially radiated into chest.  Afebrile, uncomfortable appearing, well-hydrated, abd soft/non-distended and non-tender on exam.  Symptoms are most consistent w/ gastroenteritis, possibly bacterial.  EKG non-ischemic.   Labs sig for leukocytosis.  Pt received IVF, morphine and zofran and sx improved.  Abd benign on re-examination.  D/c'd home w/ vicodin and zofran and recommended that he drink plenty of fluids.  Return precautions discussed.   Otilio Miu, PA-C 07/17/13 [phone]

## 2013-07-17 NOTE — ED Provider Notes (Addendum)
67 year old male had onset last night of vomiting and diarrhea. He denies sick contacts. He had eaten at a restaurant last night, but his wife ate the same foods but she did not take it sick. He's feeling much better after treatment in the emergency department with ondansetron and IV fluids. On exam, his abdomen is soft and nontender.    Date: 07/17/2013  Rate: 52  Rhythm: sinus bradycardia  QRS Axis: normal  Intervals: QT prolonged  ST/T Wave abnormalities: normal  Conduction Disutrbances:none  Narrative Interpretation: Sinus bradycardia, borderline prolonged QT interval, left ventricular hypertrophy. When compared with ECG of 01/15/2013, QT interval has lengthened.  Old EKG Reviewed: changes noted    Medical screening examination/treatment/procedure(s) were conducted as a shared visit with non-physician practitioner(s) and myself.  I personally evaluated the patient during the encounter   Dione Booze, MD 07/17/13 1610  Dione Booze, MD 07/17/13 [phone]

## 2013-07-17 NOTE — ED Notes (Signed)
Pt arrived via [facility] c/o N/V/D, starting at 2330, after eating [place] around 1930. Abdominal pain, followed by CP after emesis began. EMS VS 140/75, HR 54, RR 24, o2 99% RA, CBG 145. EMS administered Zofran 4 mg x 2.

## 2013-08-10 ENCOUNTER — Ambulatory Visit (INDEPENDENT_AMBULATORY_CARE_PROVIDER_SITE_OTHER): Payer: Medicare Other | Admitting: Critical Care Medicine

## 2013-08-10 ENCOUNTER — Encounter: Payer: Self-pay | Admitting: Critical Care Medicine

## 2013-08-10 ENCOUNTER — Ambulatory Visit (INDEPENDENT_AMBULATORY_CARE_PROVIDER_SITE_OTHER)
Admission: RE | Admit: 2013-08-10 | Discharge: 2013-08-10 | Disposition: A | Discharge status: Home / Self Care | Payer: Medicare Other | Source: Ambulatory Visit | Attending: Critical Care Medicine | Admitting: Critical Care Medicine

## 2013-08-10 VITALS — BP 124/60 | HR 83 | Temp 100.6°F | Ht 75.0 in | Wt 188.2 lb

## 2013-08-10 DIAGNOSIS — J189 Pneumonia, unspecified organism: Secondary | ICD-10-CM | POA: Diagnosis not present

## 2013-08-10 DIAGNOSIS — J449 Chronic obstructive pulmonary disease, unspecified: Secondary | ICD-10-CM | POA: Diagnosis not present

## 2013-08-10 DIAGNOSIS — R9389 Abnormal findings on diagnostic imaging of other specified body structures: Secondary | ICD-10-CM | POA: Diagnosis not present

## 2013-08-10 DIAGNOSIS — J4489 Other specified chronic obstructive pulmonary disease: Secondary | ICD-10-CM

## 2013-08-10 MED ORDER — PREDNISONE 10 MG PO TABS: ORAL_TABLET | ORAL | Status: DC

## 2013-08-10 MED ORDER — MOXIFLOXACIN HCL 400 MG PO TABS: 400.0000 mg | ORAL_TABLET | Freq: Every day | ORAL | Status: DC

## 2013-08-10 NOTE — Patient Instructions (Addendum)
Avelox 400mg  daily for 7days Prednisone10mg   Take 4 for three days 3 for three days 2 for three days 1 for three days and stop All other medications the same Return 1 week recheck Tammy Parrett

## 2013-08-10 NOTE — Progress Notes (Signed)
Subjective:    Patient ID: [name], male    DOB: 15-Mar-1946, 67 y.o.   MRN: [id number]  HPI  67 y.o. . [name] quit smoking 2003 08/10/2013 Chief Complaint  Patient presents with  . Acute Visit    Started with sore throat and runny nose on Sunday.  Now has prod cough with small amount of yellowish green mucus, chest tightness, runny nose, and diahrea.     Pt with fever, sore throat, runny nose, chest pain, dyspnea, yellow green mucus and arthralgias.   Symptoms for 4 days.      Review of Systems  Constitutional:   No  weight loss, night sweats,  Fevers, chills, fatigue, lassitude. HEENT:   No headaches,  Difficulty swallowing,  Tooth/dental problems,  Sore throat,                No sneezing, itching, ear ache, nasal congestion, post nasal drip,   CV:  No chest pain,  Orthopnea, PND, swelling in lower extremities, anasarca, dizziness, palpitations  GI  No heartburn, indigestion, abdominal pain, nausea, vomiting, diarrhea, change in bowel habits, loss of appetite  Resp: No shortness of breath with exertion or at rest.  No excess mucus, no productive cough,  No non-productive cough,  No coughing up of blood.  No change in color of mucus.  No wheezing.  No chest wall deformity  Skin: no rash or lesions.  GU: no dysuria, change in color of urine, no urgency or frequency.  No flank pain.  MS:  No joint pain or swelling.  No decreased range of motion.  No back pain.  Psych:  No change in mood or affect. No depression or anxiety.  No memory loss.     Objective:   Physical Exam  Filed Vitals:   08/10/13 1613  BP: 124/60  Pulse: 83  Temp: 100.6 F (38.1 C)  TempSrc: Oral  Height: 6\' 3"  (1.905 m)  Weight: 188 lb 3.2 oz (85.367 kg)  SpO2: 94%    Gen: Pleasant, well-nourished, in no distress,  normal affect  ENT: No lesions,  mouth clear,  oropharynx clear, no postnasal drip  Neck: No JVD, no TMG, no carotid bruits  Lungs: No use of accessory muscles, no  dullness to percussion, exp wheeze, scattered rhonchi RLL  Cardiovascular: RRR, heart sounds normal, no murmur or gallops, no peripheral edema  Abdomen: soft and NT, no HSM,  BS normal  Musculoskeletal: No deformities, no cyanosis or clubbing  Neuro: alert, non focal  Skin: Warm, no lesions or rashes   CXR: NAD     Assessment & Plan:   COPD, Gold A with asthmatic bronchitic component Asthmatic bronchitis with flare, no pneumonia Plan Avelox 400mg  daily for 7days Prednisone10mg   Take 4 for three days 3 for three days 2 for three days 1 for three days and stop All other medications the same Return 1 week recheck Tammy Parrett    Updated Medication List Outpatient Encounter Prescriptions as of 08/10/2013  Medication Sig Dispense Refill  . albuterol (VENTOLIN HFA) 108 (90 BASE) MCG/ACT inhaler Inhale 2 puffs into the lungs every 6 (six) hours as needed.  1 Inhaler  3  . escitalopram (LEXAPRO) 20 MG tablet Take 20 mg by mouth daily.        . fluticasone-salmeterol (ADVAIR HFA) 115-21 MCG/ACT inhaler Inhale 2 puffs into the lungs 2 (two) times daily.      [name] Kitchen levothyroxine (SYNTHROID, LEVOTHROID) 150 MCG tablet Take 150 mcg  by mouth daily.        . ondansetron (ZOFRAN) 8 MG tablet Take 1 tablet (8 mg total) by mouth every 8 (eight) hours as needed for nausea.  20 tablet  0  . oxyCODONE-acetaminophen (PERCOCET/ROXICET) 5-325 MG per tablet Take 2 tablets by mouth 4 (four) times daily.       . ranitidine (ZANTAC) 150 MG capsule Take 150 mg by mouth 2 (two) times daily as needed for heartburn (otc).       . moxifloxacin (AVELOX) 400 MG tablet Take 1 tablet (400 mg total) by mouth daily.  7 tablet  0  . predniSONE (DELTASONE) 10 MG tablet Take 4 for three days 3 for three days 2 for three days 1 for three days and stop  30 tablet  0  . [DISCONTINUED] HYDROcodone-acetaminophen (NORCO/VICODIN) 5-325 MG per tablet Take 1 tablet by mouth every 4 (four) hours as needed for pain.  12 tablet  0    No facility-administered encounter medications on file as of 08/10/2013.

## 2013-08-11 NOTE — Assessment & Plan Note (Signed)
Asthmatic bronchitis with flare, no pneumonia Plan Avelox 400mg  daily for 7days Prednisone10mg   Take 4 for three days 3 for three days 2 for three days 1 for three days and stop All other medications the same Return 1 week recheck [name]

## 2013-08-16 DIAGNOSIS — E039 Hypothyroidism, unspecified: Secondary | ICD-10-CM | POA: Diagnosis not present

## 2013-08-16 DIAGNOSIS — R109 Unspecified abdominal pain: Secondary | ICD-10-CM | POA: Diagnosis not present

## 2013-08-16 DIAGNOSIS — Z1331 Encounter for screening for depression: Secondary | ICD-10-CM | POA: Diagnosis not present

## 2013-08-16 DIAGNOSIS — F329 Major depressive disorder, single episode, unspecified: Secondary | ICD-10-CM | POA: Diagnosis not present

## 2013-08-17 ENCOUNTER — Encounter: Payer: Self-pay | Admitting: Adult Health

## 2013-08-17 ENCOUNTER — Ambulatory Visit (INDEPENDENT_AMBULATORY_CARE_PROVIDER_SITE_OTHER): Payer: Medicare Other | Admitting: Adult Health

## 2013-08-17 VITALS — BP 126/84 | HR 67 | Temp 97.6°F | Ht 75.0 in | Wt 187.6 lb

## 2013-08-17 DIAGNOSIS — Z23 Encounter for immunization: Secondary | ICD-10-CM

## 2013-08-17 DIAGNOSIS — J449 Chronic obstructive pulmonary disease, unspecified: Secondary | ICD-10-CM

## 2013-08-17 NOTE — Patient Instructions (Addendum)
Finish Prednisone taper as directed  Continue on current regimen  Follow up Dr. Delford Field  In 3-4 months and As needed   Flu shot today

## 2013-08-17 NOTE — Progress Notes (Signed)
  Subjective:    Patient ID: [name], male    DOB: 1946-03-22, 67 y.o.   MRN: [id number]  HPI 1 M Gold A COPD quit smoking 2003   08/17/2013 Follow up for COPD  1 week follow up - reports breathing is improved since last ov > still having some chest congestion, cough, wheezing.  denies f/c/s, n/v, hemoptysis.  finished Avelox yesterday, taking 2 tabs on pred taper  Seen last week in office with COPD /AB flare , tx with Avelox and Prednisone taper .  He is feeling better, no fever or orthopnea, no edema .    Review of Systems Constitutional:   No  weight loss, night sweats,  Fevers, chills, fatigue, or  lassitude.  HEENT:   No headaches,  Difficulty swallowing,  Tooth/dental problems, or  Sore throat,                No sneezing, itching, ear ache,  +nasal congestion, post nasal drip,   CV:  No chest pain,  Orthopnea, PND, swelling in lower extremities, anasarca, dizziness, palpitations, syncope.   GI  No heartburn, indigestion, abdominal pain, nausea, vomiting, diarrhea, change in bowel habits, loss of appetite, bloody stools.   Resp: .  No wheezing.  No chest wall deformity  Skin: no rash or lesions.  GU: no dysuria, change in color of urine, no urgency or frequency.  No flank pain, no hematuria   MS:  No joint pain or swelling.  No decreased range of motion.  No back pain.  Psych:  No change in mood or affect. No depression or anxiety.  No memory loss.         Objective:   Physical Exam GEN: A/Ox3; pleasant , NAD, well nourished   HEENT:  [place]/AT,  EACs-clear, TMs-wnl, NOSE-clear, THROAT-clear, no lesions, no postnasal drip or exudate noted.   NECK:  Supple w/ fair ROM; no JVD; normal carotid impulses w/o bruits; no thyromegaly or nodules palpated; no lymphadenopathy.  RESP  Few scant rhonchi .no accessory muscle use, no dullness to percussion  CARD:  RRR, no m/r/g  , no peripheral edema, pulses intact, no cyanosis or clubbing.  GI:   Soft & nt; nml bowel  sounds; no organomegaly or masses detected.  Musco: Warm bil, no deformities or joint swelling noted.   Neuro: alert, no focal deficits noted.    Skin: Warm, no lesions or rashes         Assessment & Plan:

## 2013-08-17 NOTE — Assessment & Plan Note (Signed)
Flare now resolving   Plan  Finish Prednisone taper as directed  Continue on current regimen  Follow up Dr. Delford Field  In 3-4 months and As needed   Flu shot today

## 2013-09-07 DIAGNOSIS — M159 Polyosteoarthritis, unspecified: Secondary | ICD-10-CM | POA: Diagnosis not present

## 2013-09-07 DIAGNOSIS — Z79899 Other long term (current) drug therapy: Secondary | ICD-10-CM | POA: Diagnosis not present

## 2013-09-16 ENCOUNTER — Other Ambulatory Visit: Payer: Self-pay | Admitting: Critical Care Medicine

## 2013-09-21 NOTE — Progress Notes (Signed)
Quick Note:  Called, spoke with pt. Informed him of cxr results per Dr. Delford Field. He verbalized understanding and voiced no further questions or concerns at this time. ______

## 2013-11-01 ENCOUNTER — Encounter: Payer: Self-pay | Admitting: Adult Health

## 2013-11-01 ENCOUNTER — Ambulatory Visit (INDEPENDENT_AMBULATORY_CARE_PROVIDER_SITE_OTHER): Payer: Medicare Other | Admitting: Adult Health

## 2013-11-01 VITALS — BP 132/68 | HR 64 | Temp 98.6°F | Ht 75.0 in | Wt 199.4 lb

## 2013-11-01 DIAGNOSIS — J449 Chronic obstructive pulmonary disease, unspecified: Secondary | ICD-10-CM | POA: Diagnosis not present

## 2013-11-01 DIAGNOSIS — J4489 Other specified chronic obstructive pulmonary disease: Secondary | ICD-10-CM

## 2013-11-01 MED ORDER — AMOXICILLIN-POT CLAVULANATE 875-125 MG PO TABS: 1.0000 | ORAL_TABLET | Freq: Two times a day (BID) | ORAL | Status: AC

## 2013-11-01 MED ORDER — PREDNISONE 10 MG PO TABS: ORAL_TABLET | ORAL | Status: DC

## 2013-11-01 NOTE — Patient Instructions (Signed)
Augmentin 875mg  Twice daily  For 7 days  Steroid taper over next week.  Follow up Dr. Delford Field  As planned and As needed   Please contact office for sooner follow up if symptoms do not improve or worsen or seek emergency care

## 2013-11-01 NOTE — Assessment & Plan Note (Signed)
Flare with URI   Plan  Augmentin 875mg  Twice daily  For 7 days  Steroid taper over next week.  Follow up Dr. Delford Field  As planned and As needed   Please contact office for sooner follow up if symptoms do not improve or worsen or seek emergency care

## 2013-11-01 NOTE — Progress Notes (Signed)
  Subjective:    Patient ID: [name], male    DOB: 05-30-1946, 67 y.o.   MRN: [id number]  HPI  48 M Gold A COPD quit smoking 2003   11/01/2013 Acute OV Complains of chest congestion, dry cough, chest tightness, increased SOB, wheezing, head congestion, PND, HA, body aches x1 day - denies f/c/s, hemoptysis, nausea, vomiting, edema.  Pt denies any orthopnea, chest pain, edema , recent travel or abx use.  No fever.  Remains on Advair Twice daily  .  No increased SABA use.  Last cxr 08/2013 with no acute findings.       Review of Systems  Constitutional:   No  weight loss, night sweats,  Fevers, chills, fatigue, or  lassitude.  HEENT:   No headaches,  Difficulty swallowing,  Tooth/dental problems, or  Sore throat,                No sneezing, itching, ear ache,  +nasal congestion, post nasal drip,   CV:  No chest pain,  Orthopnea, PND, swelling in lower extremities, anasarca, dizziness, palpitations, syncope.   GI  No heartburn, indigestion, abdominal pain, nausea, vomiting, diarrhea, change in bowel habits, loss of appetite, bloody stools.   Resp: .  No wheezing.  No chest wall deformity  Skin: no rash or lesions.  GU: no dysuria, change in color of urine, no urgency or frequency.  No flank pain, no hematuria   MS:  No joint pain or swelling.  No decreased range of motion.  No back pain.  Psych:  No change in mood or affect. No depression or anxiety.  No memory loss.         Objective:   Physical Exam  GEN: A/Ox3; pleasant , NAD, well nourished   HEENT:  [place]/AT,  EACs-clear, TMs-wnl, NOSE-clear, THROAT-clear, no lesions, no postnasal drip or exudate noted.   NECK:  Supple w/ fair ROM; no JVD; normal carotid impulses w/o bruits; no thyromegaly or nodules palpated; no lymphadenopathy.  RESP  Tr  scant rhonchi .no accessory muscle use, no dullness to percussion  CARD:  RRR, no m/r/g  , no peripheral edema, pulses intact, no cyanosis or clubbing.  GI:   Soft &  nt; nml bowel sounds; no organomegaly or masses detected.  Musco: Warm bil, no deformities or joint swelling noted.   Neuro: alert, no focal deficits noted.    Skin: Warm, no lesions or rashes         Assessment & Plan:

## 2013-11-02 ENCOUNTER — Ambulatory Visit: Payer: Medicare Other | Admitting: Adult Health

## 2013-11-07 DIAGNOSIS — M159 Polyosteoarthritis, unspecified: Secondary | ICD-10-CM | POA: Diagnosis not present

## 2013-11-07 DIAGNOSIS — Z79899 Other long term (current) drug therapy: Secondary | ICD-10-CM | POA: Diagnosis not present

## 2013-11-07 DIAGNOSIS — M4000 Postural kyphosis, site unspecified: Secondary | ICD-10-CM | POA: Diagnosis not present

## 2013-12-08 ENCOUNTER — Encounter: Payer: Self-pay | Admitting: Internal Medicine

## 2013-12-08 ENCOUNTER — Ambulatory Visit (INDEPENDENT_AMBULATORY_CARE_PROVIDER_SITE_OTHER): Payer: Medicare Other | Admitting: Internal Medicine

## 2013-12-08 VITALS — BP 106/70 | HR 76 | Temp 98.4°F | Ht 75.0 in | Wt 196.8 lb

## 2013-12-08 DIAGNOSIS — J449 Chronic obstructive pulmonary disease, unspecified: Secondary | ICD-10-CM

## 2013-12-08 MED ORDER — PANTOPRAZOLE SODIUM 40 MG PO TBEC: 40.0000 mg | DELAYED_RELEASE_TABLET | Freq: Every day | ORAL | Status: DC

## 2013-12-08 MED ORDER — AMOXICILLIN-POT CLAVULANATE 875-125 MG PO TABS: 1.0000 | ORAL_TABLET | Freq: Two times a day (BID) | ORAL | Status: DC

## 2013-12-08 MED ORDER — MOMETASONE FURO-FORMOTEROL FUM 100-5 MCG/ACT IN AERO: INHALATION_SPRAY | RESPIRATORY_TRACT | Status: DC

## 2013-12-08 MED ORDER — PREDNISONE 10 MG PO TABS: ORAL_TABLET | ORAL | Status: DC

## 2013-12-08 MED ORDER — RANITIDINE HCL 150 MG PO CAPS: ORAL_CAPSULE | ORAL | Status: DC

## 2013-12-08 NOTE — Patient Instructions (Addendum)
Pantoprazole (protonix) 40 mg   Take 30-60 min before first meal of the day and Zantac 150 one bedtime until return to office - this is the best way to tell whether stomach acid is contributing to your problem.    GERD (REFLUX)  is an extremely common cause of respiratory symptoms, many times with no significant heartburn at all.    It can be treated with medication, but also with lifestyle changes including avoidance of late meals, excessive alcohol, smoking cessation, and avoid fatty foods, chocolate, peppermint, colas, red wine, and acidic juices such as orange juice.  NO MINT OR MENTHOL PRODUCTS SO NO COUGH DROPS  USE SUGARLESS CANDY INSTEAD (jolley ranchers or Stover's)  NO OIL BASED VITAMINS - use powdered substitutes.   Augmentin 875 mg take one pill twice daily  X 10 days - take at breakfast and supper with large glass of water.  It would help reduce the usual side effects (diarrhea and yeast infections) if you ate cultured yogurt at lunch.   Prednisone 10 mg take  4 each am x 2 days,   2 each am x 2 days,  1 each am x 2 days and stop   dulera 100 Take 2 puffs first thing in am and then another 2 puffs about 12 hours later.   Keep appt to see [name] - we may need to consider evaluation of your sinuses if not doing better

## 2013-12-08 NOTE — Progress Notes (Signed)
  Subjective:    Patient ID: [name], male    DOB: 1946/11/29    MRN: [id number]  HPI  62 yowm  Gold A COPD quit smoking 2003   11/01/2013 Acute OV Complains of chest congestion, dry cough, chest tightness, increased SOB, wheezing, head congestion, PND, HA, body aches x1 day - denies f/c/s, hemoptysis, nausea, vomiting, edema.  Pt denies any orthopnea, chest pain, edema , recent travel or abx use.  No fever.  Remains on Advair Twice daily  .  No increased SABA use.  Last cxr 08/2013 with no acute findings rec Augmentin 875mg  Twice daily  For 7 days  Steroid taper over next week.  Follow up Dr. Joya Gaskins  As planned and As needed    12/08/2013 acute ov  ov/Wert re: ? aecopd Chief Complaint  Patient presents with  . Acute Visit    Pt c/o slight increase in SOB, chest tightness, wheezing, and prod cough with minimal green sputum x 6 days. He has used albuterol inhaler about twice per day for the past couple of days.    always have a nasal  Drainage x years  but usually clear  Not typically needing saba at all  No obvious day to day or daytime variabilty or assoc  cp or chest tightness, subjective wheeze overt   hb symptoms. No unusual exp hx or h/o childhood pna/ asthma or knowledge of premature birth.  Sleeping ok without nocturnal  or early am exacerbation  of respiratory  c/o's or need for noct saba. Also denies any obvious fluctuation of symptoms with weather or environmental changes or other aggravating or alleviating factors except as outlined above   Current Medications, Allergies, Complete Past Medical History, Past Surgical History, Family History, and Social History were reviewed in [employer] record.  ROS  The following are not active complaints unless bolded sore throat, dysphagia, dental problems, itching, sneezing,  nasal congestion or excess/ purulent secretions, ear ache,   fever, chills, sweats, unintended wt loss, pleuritic or exertional cp,  hemoptysis,  orthopnea pnd or leg swelling, presyncope, palpitations, heartburn, abdominal pain, anorexia, nausea, vomiting, diarrhea  or change in bowel or urinary habits, change in stools or urine, dysuria,hematuria,  rash, arthralgias, visual complaints, headache, numbness weakness or ataxia or problems with walking or coordination,  change in mood/affect or memory.                       Objective:   Physical Exam  GEN: A/Ox3; pleasant , NAD, well nourished  Full dentures   HEENT:  [place]/AT,  EACs-clear, TMs-wnl, NOSE-clear, THROAT-clear, no lesions, no postnasal drip or exudate noted.   NECK:  Supple w/ fair ROM; no JVD; normal carotid impulses w/o bruits; no thyromegaly or nodules palpated; no lymphadenopathy.  RESP  Bilateral mid exp rhonchino accessory muscle use, no dullness to percussion  CARD:  RRR, no m/r/g  , no peripheral edema, pulses intact, no cyanosis or clubbing.  GI:   Soft & nt; nml bowel sounds; no organomegaly or masses detected.  Musco: Warm bil, no deformities or joint swelling noted.   Neuro: alert, no focal deficits noted.    Skin: Warm, no lesions or rashes         Assessment & Plan:

## 2013-12-10 NOTE — Assessment & Plan Note (Addendum)
Having more freq exac of apparent AB in setting of min chronic airflow obst  DDX of  difficult airways managment all start with A and  include Adherence, Ace Inhibitors, Acid Reflux, Active Sinus Disease, Alpha 1 Antitripsin deficiency, Anxiety masquerading as Airways dz,  ABPA,  allergy(esp in young), Aspiration (esp in elderly), Adverse effects of DPI,  Active smokers, plus two Bs  = Bronchiectasis and Beta blocker use..and one C= CHF  Adherence is always the initial "prime suspect" and is a multilayered concern that requires a "trust but verify" approach in every patient - starting with knowing how to use medications, especially inhalers, correctly, keeping up with refills and understanding the fundamental difference between maintenance and prns vs those medications only taken for a very short course and then stopped and not refilled.  The proper method of use, as well as anticipated side effects, of a metered-dose inhaler are discussed and demonstrated to the patient. Improved effectiveness after extensive coaching during this visit to a level of approximately  90% so try dulera 100 2bid (rx as asthma rather than copd per se)  ? Acid (or non-acid) GERD > always difficult to exclude as up to 75% of pts in some series report no assoc GI/ Heartburn symptoms> rec max (24h)  acid suppression and diet restrictions/ reviewed and instructions given in writing.   ? Active sinus dz > augmentin x 10 days and low threshold for sinus ct if relapses again so quickly

## 2014-01-09 ENCOUNTER — Encounter: Payer: Self-pay | Admitting: Critical Care Medicine

## 2014-01-09 ENCOUNTER — Ambulatory Visit (INDEPENDENT_AMBULATORY_CARE_PROVIDER_SITE_OTHER): Payer: Medicare Other | Admitting: Critical Care Medicine

## 2014-01-09 VITALS — BP 142/90 | HR 65 | Temp 98.1°F | Ht 75.0 in | Wt 199.2 lb

## 2014-01-09 DIAGNOSIS — J449 Chronic obstructive pulmonary disease, unspecified: Secondary | ICD-10-CM

## 2014-01-09 NOTE — Patient Instructions (Signed)
When current supply of protonix is out, you may stop and not refill Stay on zantac at night Stay on dulera two puff twice daily and stay off advair Return 3 months

## 2014-01-09 NOTE — Assessment & Plan Note (Signed)
Asthmatic bronchitis flare , exac by GERD Now better Plan Try reflux diet and wean PPI to off, stay on H2 blocker Stay on dulera, good hfa technieque

## 2014-01-09 NOTE — Progress Notes (Signed)
Subjective:    Patient ID: [name], male    DOB: 11-26-1946, 68 y.o.   MRN: [id number]  HPI  24 yowm  Gold A COPD quit smoking 2003    01/09/2014 Chief Complaint  Patient presents with  . 1 month follow up    Breathing is back to baseline from last OV with MW.  Wallie Renshaw wheezing and some chest tightness.  No cough.  At last ov rx sinus infx with augmentin and changed to dulera 90% on hfa Pt on dulera, sinuses are better.  Cannot tell difference between the dulera/advair. Pt also given GERD Rx and pred pulse.   Review of Systems Constitutional:   No  weight loss, night sweats,  Fevers, chills, fatigue, lassitude. HEENT:   No headaches,  Difficulty swallowing,  Tooth/dental problems,  Sore throat,                No sneezing, itching, ear ache, nasal congestion, post nasal drip,   CV:  No chest pain,  Orthopnea, PND, swelling in lower extremities, anasarca, dizziness, palpitations  GI  No heartburn, indigestion, abdominal pain, nausea, vomiting, diarrhea, change in bowel habits, loss of appetite  Resp: Notes shortness of breath with exertion not at rest.  No excess mucus, no productive cough,  No non-productive cough,  No coughing up of blood.  No change in color of mucus.  No wheezing.  No chest wall deformity  Skin: no rash or lesions.  GU: no dysuria, change in color of urine, no urgency or frequency.  No flank pain.  MS:  No joint pain or swelling.  No decreased range of motion.  No back pain.  Psych:  No change in mood or affect. No depression or anxiety.  No memory loss.     Objective:   Physical Exam Filed Vitals:   01/09/14 0858  BP: 142/90  Pulse: 65  Temp: 98.1 F (36.7 C)  TempSrc: Oral  Height: 6\' 3"  (1.905 m)  Weight: 199 lb 3.2 oz (90.357 kg)  SpO2: 98%    Gen: Pleasant, well-nourished, in no distress,  normal affect  ENT: No lesions,  mouth clear,  oropharynx clear, no postnasal drip  Neck: No JVD, no TMG, no carotid bruits  Lungs: No use of  accessory muscles, no dullness to percussion, clear without rales or rhonchi  Cardiovascular: RRR, heart sounds normal, no murmur or gallops, no peripheral edema  Abdomen: soft and NT, no HSM,  BS normal  Musculoskeletal: No deformities, no cyanosis or clubbing  Neuro: alert, non focal  Skin: Warm, no lesions or rashes  No results found.        Assessment & Plan:   COPD, Gold A with asthmatic bronchitic component Asthmatic bronchitis flare , exac by GERD Now better Plan Try reflux diet and wean PPI to off, stay on H2 blocker Stay on dulera, good hfa technieque   Updated Medication List Outpatient Encounter Prescriptions as of 01/09/2014  Medication Sig  . albuterol (VENTOLIN HFA) 108 (90 BASE) MCG/ACT inhaler Inhale 2 puffs into the lungs every 6 (six) hours as needed.  [name] Kitchen escitalopram (LEXAPRO) 20 MG tablet Take 20 mg by mouth daily.    [name] Kitchen levothyroxine (SYNTHROID, LEVOTHROID) 150 MCG tablet Take 150 mcg by mouth daily.    . mometasone-formoterol (DULERA) 100-5 MCG/ACT AERO Take 2 puffs first thing in am and then another 2 puffs about 12 hours later.  . ondansetron (ZOFRAN) 8 MG tablet Take 1 tablet (8 mg total) by  mouth every 8 (eight) hours as needed for nausea.  [name] Kitchen oxyCODONE-acetaminophen (PERCOCET/ROXICET) 5-325 MG per tablet Take 2 tablets by mouth 4 (four) times daily.   . pantoprazole (PROTONIX) 40 MG tablet Take 1 tablet (40 mg total) by mouth daily. Take 30-60 min before first meal of the day  . ranitidine (ZANTAC) 150 MG capsule One at bedtime  . [DISCONTINUED] amoxicillin-clavulanate (AUGMENTIN) 875-125 MG per tablet Take 1 tablet by mouth 2 (two) times daily.  . [DISCONTINUED] predniSONE (DELTASONE) 10 MG tablet Take  4 each am x 2 days,   2 each am x 2 days,  1 each am x 2 days and stop     \

## 2014-01-11 DIAGNOSIS — Z79899 Other long term (current) drug therapy: Secondary | ICD-10-CM | POA: Diagnosis not present

## 2014-01-11 DIAGNOSIS — M159 Polyosteoarthritis, unspecified: Secondary | ICD-10-CM | POA: Diagnosis not present

## 2014-01-11 DIAGNOSIS — M2550 Pain in unspecified joint: Secondary | ICD-10-CM | POA: Diagnosis not present

## 2014-02-25 DIAGNOSIS — J111 Influenza due to unidentified influenza virus with other respiratory manifestations: Secondary | ICD-10-CM | POA: Diagnosis not present

## 2014-02-25 DIAGNOSIS — R509 Fever, unspecified: Secondary | ICD-10-CM | POA: Diagnosis not present

## 2014-02-25 DIAGNOSIS — R1013 Epigastric pain: Secondary | ICD-10-CM | POA: Diagnosis not present

## 2014-02-25 DIAGNOSIS — J449 Chronic obstructive pulmonary disease, unspecified: Secondary | ICD-10-CM | POA: Diagnosis not present

## 2014-03-08 DIAGNOSIS — M2550 Pain in unspecified joint: Secondary | ICD-10-CM | POA: Diagnosis not present

## 2014-03-08 DIAGNOSIS — Z79899 Other long term (current) drug therapy: Secondary | ICD-10-CM | POA: Diagnosis not present

## 2014-03-08 DIAGNOSIS — M159 Polyosteoarthritis, unspecified: Secondary | ICD-10-CM | POA: Diagnosis not present

## 2014-03-29 DIAGNOSIS — M899 Disorder of bone, unspecified: Secondary | ICD-10-CM | POA: Diagnosis not present

## 2014-03-29 DIAGNOSIS — N400 Enlarged prostate without lower urinary tract symptoms: Secondary | ICD-10-CM | POA: Diagnosis not present

## 2014-03-29 DIAGNOSIS — M949 Disorder of cartilage, unspecified: Secondary | ICD-10-CM | POA: Diagnosis not present

## 2014-03-29 DIAGNOSIS — E291 Testicular hypofunction: Secondary | ICD-10-CM | POA: Diagnosis not present

## 2014-03-29 DIAGNOSIS — M810 Age-related osteoporosis without current pathological fracture: Secondary | ICD-10-CM | POA: Diagnosis not present

## 2014-05-03 DIAGNOSIS — M159 Polyosteoarthritis, unspecified: Secondary | ICD-10-CM | POA: Diagnosis not present

## 2014-05-03 DIAGNOSIS — M2550 Pain in unspecified joint: Secondary | ICD-10-CM | POA: Diagnosis not present

## 2014-05-03 DIAGNOSIS — Z79899 Other long term (current) drug therapy: Secondary | ICD-10-CM | POA: Diagnosis not present

## 2014-05-23 DIAGNOSIS — F3289 Other specified depressive episodes: Secondary | ICD-10-CM | POA: Diagnosis not present

## 2014-05-23 DIAGNOSIS — F329 Major depressive disorder, single episode, unspecified: Secondary | ICD-10-CM | POA: Diagnosis not present

## 2014-05-23 DIAGNOSIS — L570 Actinic keratosis: Secondary | ICD-10-CM | POA: Diagnosis not present

## 2014-06-09 DIAGNOSIS — L570 Actinic keratosis: Secondary | ICD-10-CM | POA: Diagnosis not present

## 2014-06-09 DIAGNOSIS — I781 Nevus, non-neoplastic: Secondary | ICD-10-CM | POA: Diagnosis not present

## 2014-06-09 DIAGNOSIS — D235 Other benign neoplasm of skin of trunk: Secondary | ICD-10-CM | POA: Diagnosis not present

## 2014-06-09 DIAGNOSIS — L821 Other seborrheic keratosis: Secondary | ICD-10-CM | POA: Diagnosis not present

## 2014-06-20 DIAGNOSIS — F329 Major depressive disorder, single episode, unspecified: Secondary | ICD-10-CM | POA: Diagnosis not present

## 2014-06-20 DIAGNOSIS — F3289 Other specified depressive episodes: Secondary | ICD-10-CM | POA: Diagnosis not present

## 2014-06-20 DIAGNOSIS — E039 Hypothyroidism, unspecified: Secondary | ICD-10-CM | POA: Diagnosis not present

## 2014-07-03 DIAGNOSIS — M159 Polyosteoarthritis, unspecified: Secondary | ICD-10-CM | POA: Diagnosis not present

## 2014-07-03 DIAGNOSIS — M2550 Pain in unspecified joint: Secondary | ICD-10-CM | POA: Diagnosis not present

## 2014-07-03 DIAGNOSIS — M4000 Postural kyphosis, site unspecified: Secondary | ICD-10-CM | POA: Diagnosis not present

## 2014-07-14 DIAGNOSIS — M810 Age-related osteoporosis without current pathological fracture: Secondary | ICD-10-CM | POA: Diagnosis not present

## 2014-08-09 ENCOUNTER — Ambulatory Visit (INDEPENDENT_AMBULATORY_CARE_PROVIDER_SITE_OTHER): Payer: Medicare Other | Admitting: Critical Care Medicine

## 2014-08-09 ENCOUNTER — Encounter: Payer: Self-pay | Admitting: Critical Care Medicine

## 2014-08-09 VITALS — BP 134/80 | HR 62 | Temp 97.5°F | Ht 75.0 in | Wt 211.2 lb

## 2014-08-09 DIAGNOSIS — Z23 Encounter for immunization: Secondary | ICD-10-CM

## 2014-08-09 DIAGNOSIS — J984 Other disorders of lung: Secondary | ICD-10-CM

## 2014-08-09 DIAGNOSIS — J449 Chronic obstructive pulmonary disease, unspecified: Secondary | ICD-10-CM

## 2014-08-09 DIAGNOSIS — R918 Other nonspecific abnormal finding of lung field: Secondary | ICD-10-CM | POA: Diagnosis not present

## 2014-08-09 MED ORDER — MOMETASONE FURO-FORMOTEROL FUM 100-5 MCG/ACT IN AERO: INHALATION_SPRAY | RESPIRATORY_TRACT | Status: DC

## 2014-08-09 NOTE — Assessment & Plan Note (Signed)
Gold A copd stable at present Plan Prevnar 13 and Flu vaccine was given Refill on [facility] sent CT Chest and Pulmonary functions ordered Return 6  months

## 2014-08-09 NOTE — Progress Notes (Signed)
Subjective:    Patient ID: [name], male    DOB: Aug 30, 1946, 68 y.o.   MRN: [id number]  HPI 08/09/2014 Chief Complaint  Patient presents with  . Follow-up    Has DOE but overall feels breathing is doing "fairly well."  Chest tightness at times and occas wheezing.  No cough.  No flare ups past summer.  Dyspnea is at baseline.  Occ chest tightness.   No qhs cough or dyspnea.  No edema in feet.  No mucus or hemoptysis.  Heartburn is controlled. Sees Nicholaus Bloom for chronic pain.     Review of Systems Constitutional:   No  weight loss, night sweats,  Fevers, chills, fatigue, lassitude. HEENT:   No headaches,  Difficulty swallowing,  Tooth/dental problems,  Sore throat,                No sneezing, itching, ear ache, nasal congestion, post nasal drip,   CV:  No chest pain,  Orthopnea, PND, swelling in lower extremities, anasarca, dizziness, palpitations  GI  No heartburn, indigestion, abdominal pain, nausea, vomiting, diarrhea, change in bowel habits, loss of appetite  Resp: Notes shortness of breath with exertion not at rest.  No excess mucus, no productive cough,  No non-productive cough,  No coughing up of blood.  No change in color of mucus.  No wheezing.  No chest wall deformity  Skin: no rash or lesions.  GU: no dysuria, change in color of urine, no urgency or frequency.  No flank pain.  MS:  No joint pain or swelling.  No decreased range of motion.  Notes  back pain. Lower lateral abdomen  Psych:  No change in mood or affect. No depression or anxiety.  No memory loss.     Objective:   Physical Exam Filed Vitals:   08/09/14 0949  BP: 134/80  Pulse: 62  Temp: 97.5 F (36.4 C)  TempSrc: Oral  Height: 6\' 3"  (1.905 m)  Weight: 211 lb 3.2 oz (95.8 kg)  SpO2: 96%    Gen: Pleasant, well-nourished, in no distress,  normal affect  ENT: No lesions,  mouth clear,  oropharynx clear, no postnasal drip  Neck: No JVD, no TMG, no carotid bruits  Lungs: No use of  accessory muscles, no dullness to percussion, distant BS  Cardiovascular: RRR, heart sounds normal, no murmur or gallops, no peripheral edema  Abdomen: soft and NT, no HSM,  BS normal  Musculoskeletal: No deformities, no cyanosis or clubbing  Neuro: alert, non focal  Skin: Warm, no lesions or rashes  No results found.        Assessment & Plan:   COPD, Gold A with asthmatic bronchitic component Gold A copd stable at present Plan Prevnar 13 and Flu vaccine was given Refill on [facility] sent CT Chest and Pulmonary functions ordered Return 6  months   PULMONARY NODULE, LEFT LOWER LOBE Repeat CT Chest for surveillance    Updated Medication List Outpatient Encounter Prescriptions as of 08/09/2014  Medication Sig  . albuterol (VENTOLIN HFA) 108 (90 BASE) MCG/ACT inhaler Inhale 2 puffs into the lungs every 6 (six) hours as needed.  . DULoxetine (CYMBALTA) 30 MG capsule Take 3 capsules by mouth daily.  [name] Kitchen levothyroxine (SYNTHROID, LEVOTHROID) 150 MCG tablet Take 150 mcg by mouth daily.    . mometasone-formoterol (DULERA) 100-5 MCG/ACT AERO Take 2 puffs first thing in am and then another 2 puffs about 12 hours later.  . ondansetron (ZOFRAN) 8 MG tablet Take 1 tablet (  8 mg total) by mouth every 8 (eight) hours as needed for nausea.  [name] Kitchen oxyCODONE-acetaminophen (PERCOCET/ROXICET) 5-325 MG per tablet Take 2 tablets by mouth 4 (four) times daily.   . pantoprazole (PROTONIX) 40 MG tablet Take 1 tablet (40 mg total) by mouth daily. Take 30-60 min before first meal of the day  . ranitidine (ZANTAC) 150 MG capsule One at bedtime  . testosterone cypionate (DEPOTESTOTERONE CYPIONATE) 200 MG/ML injection Inject 0.5 mLs into the muscle once a week.  . Zoledronic Acid (ZOMETA IV) Inject into the vein every 3 (three) months.  . [DISCONTINUED] mometasone-formoterol (DULERA) 100-5 MCG/ACT AERO Take 2 puffs first thing in am and then another 2 puffs about 12 hours later.  . [DISCONTINUED] escitalopram  (LEXAPRO) 20 MG tablet Take 20 mg by mouth daily.

## 2014-08-09 NOTE — Patient Instructions (Signed)
Prevnar 13 and Flu vaccine was given Refill on [facility] sent CT Chest and Pulmonary functions ordered Return 6  months

## 2014-08-09 NOTE — Assessment & Plan Note (Signed)
Repeat CT Chest for surveillance

## 2014-08-15 ENCOUNTER — Ambulatory Visit (INDEPENDENT_AMBULATORY_CARE_PROVIDER_SITE_OTHER)
Admission: RE | Admit: 2014-08-15 | Discharge: 2014-08-15 | Disposition: A | Discharge status: Home / Self Care | Payer: Medicare Other | Source: Ambulatory Visit | Attending: Critical Care Medicine | Admitting: Critical Care Medicine

## 2014-08-15 DIAGNOSIS — R918 Other nonspecific abnormal finding of lung field: Secondary | ICD-10-CM

## 2014-08-15 DIAGNOSIS — J479 Bronchiectasis, uncomplicated: Secondary | ICD-10-CM | POA: Diagnosis not present

## 2014-08-17 ENCOUNTER — Telehealth: Payer: Self-pay | Admitting: Critical Care Medicine

## 2014-08-17 NOTE — Progress Notes (Signed)
Quick Note:  Spoke with pt and notified of results per Dr. Wright. Pt verbalized understanding and denied any questions.  ______ 

## 2014-08-17 NOTE — Telephone Encounter (Signed)
Notes Recorded by Elsie Stain, MD on 08/16/2014 at 2:28 PM Call pt and tell him he only has one small nodule that is unchanged going back to 2007 This is benign no other nodules, no cancer   Spoke with pt and notified of results per Dr. Joya Gaskins. Pt verbalized understanding and denied any questions.

## 2014-08-17 NOTE — Progress Notes (Signed)
Quick Note:  lmomtcb for pt ______ 

## 2014-08-28 DIAGNOSIS — Z79899 Other long term (current) drug therapy: Secondary | ICD-10-CM | POA: Diagnosis not present

## 2014-08-28 DIAGNOSIS — M159 Polyosteoarthritis, unspecified: Secondary | ICD-10-CM | POA: Diagnosis not present

## 2014-08-28 DIAGNOSIS — M2550 Pain in unspecified joint: Secondary | ICD-10-CM | POA: Diagnosis not present

## 2014-09-07 ENCOUNTER — Other Ambulatory Visit: Payer: Self-pay | Admitting: Critical Care Medicine

## 2014-09-07 DIAGNOSIS — R0600 Dyspnea, unspecified: Secondary | ICD-10-CM

## 2014-09-08 ENCOUNTER — Ambulatory Visit (INDEPENDENT_AMBULATORY_CARE_PROVIDER_SITE_OTHER): Payer: Medicare Other | Admitting: Critical Care Medicine

## 2014-09-08 DIAGNOSIS — R0600 Dyspnea, unspecified: Secondary | ICD-10-CM | POA: Diagnosis not present

## 2014-09-08 LAB — PULMONARY FUNCTION TEST
FEF 25-75 POST: 2.17 L/s
FEF 25-75 PRE: 2.3 L/s
FEF2575-%Change-Post: -5 %
FEF2575-%PRED-POST: 78 %
FEF2575-%PRED-PRE: 83 %
FEV1-%Change-Post: 0 %
FEV1-%PRED-POST: 94 %
FEV1-%Pred-Pre: 94 %
FEV1-PRE: 3.46 L
FEV1-Post: 3.44 L
FEV1FVC-%Change-Post: 0 %
FEV1FVC-%Pred-Pre: 96 %
FEV6-%Change-Post: 0 %
FEV6-%PRED-PRE: 102 %
FEV6-%Pred-Post: 102 %
FEV6-POST: 4.77 L
FEV6-Pre: 4.79 L
FEV6FVC-%CHANGE-POST: 0 %
FEV6FVC-%PRED-POST: 104 %
FEV6FVC-%Pred-Pre: 104 %
FVC-%CHANGE-POST: 0 %
FVC-%PRED-POST: 97 %
FVC-%PRED-PRE: 98 %
FVC-Post: 4.83 L
FVC-Pre: 4.85 L
Post FEV1/FVC ratio: 71 %
Post FEV6/FVC ratio: 99 %
Pre FEV1/FVC ratio: 71 %
Pre FEV6/FVC Ratio: 99 %

## 2014-09-08 NOTE — Progress Notes (Signed)
Spirometry pre and post done today. 

## 2014-09-11 NOTE — Progress Notes (Signed)
Quick Note:  Called, spoke with pt. Informed him of results and recs per Dr. Joya Gaskins. He verbalized understanding and voiced no further questions or concerns at this time. ______

## 2014-09-18 DIAGNOSIS — R194 Change in bowel habit: Secondary | ICD-10-CM | POA: Diagnosis not present

## 2014-09-18 DIAGNOSIS — R197 Diarrhea, unspecified: Secondary | ICD-10-CM | POA: Diagnosis not present

## 2014-09-21 ENCOUNTER — Telehealth: Payer: Self-pay | Admitting: Critical Care Medicine

## 2014-09-21 NOTE — Telephone Encounter (Signed)
Called and spoke to pt's wife, Nella. Nella stated pt decided to wait till the tomorrow appt because he wants to rest. Pt denies SOB and CP/tightness. Pt c/o head cold and congestion. Pt has an increase in tempeture at 99.3. Pt aware that if s/s worsen to seek emergency care. Nothing further needed at this time.

## 2014-09-22 ENCOUNTER — Ambulatory Visit (INDEPENDENT_AMBULATORY_CARE_PROVIDER_SITE_OTHER): Payer: Medicare Other | Admitting: Adult Health

## 2014-09-22 ENCOUNTER — Encounter: Payer: Self-pay | Admitting: Adult Health

## 2014-09-22 VITALS — BP 110/60 | HR 94 | Ht 73.0 in | Wt 209.0 lb

## 2014-09-22 DIAGNOSIS — J441 Chronic obstructive pulmonary disease with (acute) exacerbation: Secondary | ICD-10-CM | POA: Diagnosis not present

## 2014-09-22 MED ORDER — PREDNISONE 10 MG PO TABS: ORAL_TABLET | ORAL | Status: DC

## 2014-09-22 MED ORDER — LEVOFLOXACIN 500 MG PO TABS: 500.0000 mg | ORAL_TABLET | Freq: Every day | ORAL | Status: AC

## 2014-09-22 NOTE — Patient Instructions (Signed)
Levaquin 500mg  daily for  7 days  Prednisone taper over next week week.  Mucinex DM Twice daily  As needed  Cough/congestion  Fluids and rest . Follow up Dr. Joya Gaskins  As planned and As needed   Please contact office for sooner follow up if symptoms do not improve or worsen or seek emergency care

## 2014-09-22 NOTE — Progress Notes (Signed)
  Subjective:    Patient ID: [name], male    DOB: 02-16-1946, 68 y.o.   MRN: [id number]  HPI  51 M Gold A COPD quit smoking 2003   09/22/2014 Acute OV Complains of cough, SOB, some wheezing and chest tightness for week and a half. Pateint had a head cold previously. Had fever last night .  Patient denies any hemoptysis, orthopnea, PND, or leg swelling. Has been using Mucinex with some relief. No recent travel or antibiotic use. Patient complains of nasal congestion, drainage, and thick, sticky mucus.     Review of Systems  Constitutional:   No  weight loss, night sweats,  Fevers, chills, fatigue, or  lassitude.  HEENT:   No headaches,  Difficulty swallowing,  Tooth/dental problems, or  Sore throat,                No sneezing, itching, ear ache,  +nasal congestion, post nasal drip,   CV:  No chest pain,  Orthopnea, PND, swelling in lower extremities, anasarca, dizziness, palpitations, syncope.   GI  No heartburn, indigestion, abdominal pain, nausea, vomiting, diarrhea, change in bowel habits, loss of appetite, bloody stools.   Resp:   No chest wall deformity  Skin: no rash or lesions.  GU: no dysuria, change in color of urine, no urgency or frequency.  No flank pain, no hematuria   MS:  No joint pain or swelling.  No decreased range of motion.  No back pain.  Psych:  No change in mood or affect. No depression or anxiety.  No memory loss.         Objective:   Physical Exam  GEN: A/Ox3; pleasant , NAD, elderly   HEENT:  [place]/AT,  EACs-clear, TMs-wnl, NOSE-clear, THROAT-clear, no lesions, no postnasal drip or exudate noted.   NECK:  Supple w/ fair ROM; no JVD; normal carotid impulses w/o bruits; no thyromegaly or nodules palpated; no lymphadenopathy.  RESP  Few exp wheezes  .no accessory muscle use, no dullness to percussion  CARD:  RRR, no m/r/g  , no peripheral edema, pulses intact, no cyanosis or clubbing.  GI:   Soft & nt; nml bowel sounds; no organomegaly  or masses detected.  Musco: Warm bil, no deformities or joint swelling noted.   Neuro: alert, no focal deficits noted.    Skin: Warm, no lesions or rashes         Assessment & Plan:

## 2014-09-22 NOTE — Assessment & Plan Note (Addendum)
Flare  Pt education given on steroid use   Plan  Levaquin 500mg  daily for  7 days  Prednisone taper over next week week.  Mucinex DM Twice daily  As needed  Cough/congestion  Fluids and rest . Follow up Dr. Joya Gaskins  As planned and As needed   Please contact office for sooner follow up if symptoms do not improve or worsen or seek emergency care

## 2014-10-02 DIAGNOSIS — R194 Change in bowel habit: Secondary | ICD-10-CM | POA: Diagnosis not present

## 2014-10-13 DIAGNOSIS — M810 Age-related osteoporosis without current pathological fracture: Secondary | ICD-10-CM | POA: Diagnosis not present

## 2014-10-23 DIAGNOSIS — M150 Primary generalized (osteo)arthritis: Secondary | ICD-10-CM | POA: Diagnosis not present

## 2014-10-23 DIAGNOSIS — Z79891 Long term (current) use of opiate analgesic: Secondary | ICD-10-CM | POA: Diagnosis not present

## 2014-10-23 DIAGNOSIS — G894 Chronic pain syndrome: Secondary | ICD-10-CM | POA: Diagnosis not present

## 2014-12-07 ENCOUNTER — Other Ambulatory Visit: Payer: Self-pay | Admitting: *Deleted

## 2014-12-07 MED ORDER — MOMETASONE FURO-FORMOTEROL FUM 100-5 MCG/ACT IN AERO: INHALATION_SPRAY | RESPIRATORY_TRACT | Status: DC

## 2014-12-19 DIAGNOSIS — M150 Primary generalized (osteo)arthritis: Secondary | ICD-10-CM | POA: Diagnosis not present

## 2014-12-19 DIAGNOSIS — G894 Chronic pain syndrome: Secondary | ICD-10-CM | POA: Diagnosis not present

## 2014-12-19 DIAGNOSIS — Z79891 Long term (current) use of opiate analgesic: Secondary | ICD-10-CM | POA: Diagnosis not present

## 2014-12-27 DIAGNOSIS — R197 Diarrhea, unspecified: Secondary | ICD-10-CM | POA: Diagnosis not present

## 2015-01-19 DIAGNOSIS — M810 Age-related osteoporosis without current pathological fracture: Secondary | ICD-10-CM | POA: Diagnosis not present

## 2015-02-12 DIAGNOSIS — G894 Chronic pain syndrome: Secondary | ICD-10-CM | POA: Diagnosis not present

## 2015-02-12 DIAGNOSIS — M150 Primary generalized (osteo)arthritis: Secondary | ICD-10-CM | POA: Diagnosis not present

## 2015-02-12 DIAGNOSIS — Z79891 Long term (current) use of opiate analgesic: Secondary | ICD-10-CM | POA: Diagnosis not present

## 2015-02-14 ENCOUNTER — Ambulatory Visit (INDEPENDENT_AMBULATORY_CARE_PROVIDER_SITE_OTHER): Payer: Medicare Other | Admitting: Critical Care Medicine

## 2015-02-14 ENCOUNTER — Encounter: Payer: Self-pay | Admitting: Critical Care Medicine

## 2015-02-14 VITALS — BP 122/72 | HR 75 | Temp 98.0°F | Ht 75.0 in | Wt 199.6 lb

## 2015-02-14 DIAGNOSIS — J441 Chronic obstructive pulmonary disease with (acute) exacerbation: Secondary | ICD-10-CM | POA: Diagnosis not present

## 2015-02-14 MED ORDER — AZITHROMYCIN 250 MG PO TABS: ORAL_TABLET | ORAL | Status: DC

## 2015-02-14 MED ORDER — METHYLPREDNISOLONE ACETATE 80 MG/ML IJ SUSP
120.0000 mg | Freq: Once | INTRAMUSCULAR | Status: AC
  Administered 2015-02-14: 120 mg via INTRAMUSCULAR

## 2015-02-14 NOTE — Progress Notes (Signed)
Subjective:    Patient ID: [name], male    DOB: 10-15-1946, 69 y.o.   MRN: [id number]  HPI  02/14/2015 Chief Complaint  Patient presents with  . Follow-up    Last seen 08/09/2014. C/O wheezing, productive cough, chest tightness x 1week. Denies f/c/s, n/v, hemoptysis, or edema   Just started wheezing and cough prod clear, notes some chest tightness.  Some pndrip,  No rhinorrhea.  No f/c/s.  No hemoptysis. No chest pain, just tightness.    Pt denies any significant sore throat, nasal congestion or excess secretions, fever, chills, sweats, unintended weight loss, pleurtic or exertional chest pain, orthopnea PND, or leg swelling Pt denies any increase in rescue therapy over baseline, denies waking up needing it or having any early am or nocturnal exacerbations of coughing/wheezing/or dyspnea. Pt also denies any obvious fluctuation in symptoms with  weather or environmental change or other alleviating or aggravating factors  Review of Systems Constitutional:   No  weight loss, night sweats,  Fevers, chills, fatigue, lassitude. HEENT:   No headaches,  Difficulty swallowing,  Tooth/dental problems,  Sore throat,                No sneezing, itching, ear ache, nasal congestion, post nasal drip,   CV:  No chest pain,  Orthopnea, PND, swelling in lower extremities, anasarca, dizziness, palpitations  GI  No heartburn, indigestion, abdominal pain, nausea, vomiting, diarrhea, change in bowel habits, loss of appetite  Resp: Notes shortness of breath with exertion not at rest.  No excess mucus, notes productive cough,  No non-productive cough,  No coughing up of blood.  No change in color of mucus.  No wheezing.  No chest wall deformity  Skin: no rash or lesions.  GU: no dysuria, change in color of urine, no urgency or frequency.  No flank pain.  MS:  No joint pain or swelling.  No decreased range of motion.  Notes  back pain. Lower lateral abdomen  Psych:  No change in mood or affect. No  depression or anxiety.  No memory loss.     Objective:   Physical Exam Filed Vitals:   02/14/15 0912  BP: 122/72  Pulse: 75  Temp: 98 F (36.7 C)  TempSrc: Oral  Height: 6\' 3"  (1.905 m)  Weight: 199 lb 9.6 oz (90.538 kg)  SpO2: 97%    Gen: Pleasant, well-nourished, in no distress,  normal affect  ENT: No lesions,  mouth clear,  oropharynx clear, no postnasal drip  Neck: No JVD, no TMG, no carotid bruits  Lungs: No use of accessory muscles, no dullness to percussion, distant BS  Cardiovascular: RRR, heart sounds normal, no murmur or gallops, no peripheral edema  Abdomen: soft and NT, no HSM,  BS normal  Musculoskeletal: No deformities, no cyanosis or clubbing  Neuro: alert, non focal  Skin: Warm, no lesions or rashes  No results found.        Assessment & Plan:   COPD exacerbation Copd exacerbation, mild Plan  A depomedrol injection 120mg  IM was given Stay on inhalers A printed azithromycin Rx was given , for pt to fill should he worsen     Updated Medication List Outpatient Encounter Prescriptions as of 02/14/2015  Medication Sig  . albuterol (VENTOLIN HFA) 108 (90 BASE) MCG/ACT inhaler Inhale 2 puffs into the lungs every 6 (six) hours as needed.  [name] Kitchen azithromycin (ZITHROMAX) 250 MG tablet Take two once then one daily until gone  . DULoxetine (CYMBALTA) 30  MG capsule Take 3 capsules by mouth daily.  [name] Kitchen levothyroxine (SYNTHROID, LEVOTHROID) 150 MCG tablet Take 150 mcg by mouth daily.    . mometasone-formoterol (DULERA) 100-5 MCG/ACT AERO Take 2 puffs first thing in am and then another 2 puffs about 12 hours later.  . ondansetron (ZOFRAN) 8 MG tablet Take 1 tablet (8 mg total) by mouth every 8 (eight) hours as needed for nausea.  [name] Kitchen oxyCODONE-acetaminophen (PERCOCET/ROXICET) 5-325 MG per tablet Take 2 tablets by mouth 4 (four) times daily.   . pantoprazole (PROTONIX) 40 MG tablet Take 1 tablet (40 mg total) by mouth daily. Take 30-60 min before first meal of  the day  . ranitidine (ZANTAC) 150 MG capsule One at bedtime  . testosterone cypionate (DEPOTESTOTERONE CYPIONATE) 200 MG/ML injection Inject 0.5 mLs into the muscle once a week.  . Zoledronic Acid (ZOMETA IV) Inject into the vein every 3 (three) months.  . [DISCONTINUED] HYDROcodone-acetaminophen (NORCO/VICODIN) 5-325 MG per tablet Take 2 tablets by mouth 4 (four) times daily.  . [DISCONTINUED] predniSONE (DELTASONE) 10 MG tablet 4 tabs for 2 days, then 3 tabs for 2 days, 2 tabs for 2 days, then 1 tab for 2 days, then stop  . [EXPIRED] methylPREDNISolone acetate (DEPO-MEDROL) injection 120 mg

## 2015-02-14 NOTE — Patient Instructions (Signed)
A depomedrol injection 120mg  IM was given Stay on inhalers A printed azithromycin Rx was given , fill this if you worsen and call us if you fill this  Return 6 months or sooner if you worsen

## 2015-02-15 NOTE — Assessment & Plan Note (Signed)
Copd exacerbation, mild Plan  A depomedrol injection 120mg  IM was given Stay on inhalers A printed azithromycin Rx was given , for pt to fill should he worsen

## 2015-02-26 ENCOUNTER — Encounter: Payer: Self-pay | Admitting: Critical Care Medicine

## 2015-02-26 ENCOUNTER — Other Ambulatory Visit: Payer: Self-pay | Admitting: Urology

## 2015-02-28 DIAGNOSIS — F329 Major depressive disorder, single episode, unspecified: Secondary | ICD-10-CM | POA: Diagnosis not present

## 2015-02-28 DIAGNOSIS — E039 Hypothyroidism, unspecified: Secondary | ICD-10-CM | POA: Diagnosis not present

## 2015-02-28 DIAGNOSIS — K219 Gastro-esophageal reflux disease without esophagitis: Secondary | ICD-10-CM | POA: Diagnosis not present

## 2015-02-28 DIAGNOSIS — I10 Essential (primary) hypertension: Secondary | ICD-10-CM | POA: Diagnosis not present

## 2015-04-02 DIAGNOSIS — R490 Dysphonia: Secondary | ICD-10-CM | POA: Diagnosis not present

## 2015-04-09 DIAGNOSIS — M150 Primary generalized (osteo)arthritis: Secondary | ICD-10-CM | POA: Diagnosis not present

## 2015-04-09 DIAGNOSIS — G894 Chronic pain syndrome: Secondary | ICD-10-CM | POA: Diagnosis not present

## 2015-04-09 DIAGNOSIS — Z79891 Long term (current) use of opiate analgesic: Secondary | ICD-10-CM | POA: Diagnosis not present

## 2015-04-17 DIAGNOSIS — R490 Dysphonia: Secondary | ICD-10-CM | POA: Diagnosis not present

## 2015-04-17 DIAGNOSIS — F17200 Nicotine dependence, unspecified, uncomplicated: Secondary | ICD-10-CM | POA: Diagnosis not present

## 2015-04-17 DIAGNOSIS — J387 Other diseases of larynx: Secondary | ICD-10-CM | POA: Diagnosis not present

## 2015-04-17 DIAGNOSIS — E039 Hypothyroidism, unspecified: Secondary | ICD-10-CM | POA: Diagnosis not present

## 2015-04-19 ENCOUNTER — Ambulatory Visit: Payer: Self-pay | Admitting: Otolaryngology

## 2015-04-19 ENCOUNTER — Encounter (HOSPITAL_BASED_OUTPATIENT_CLINIC_OR_DEPARTMENT_OTHER): Payer: Self-pay | Admitting: *Deleted

## 2015-04-19 NOTE — Progress Notes (Addendum)
Coming tomorrow for BMET, EKG. Bring all medications and inhalers day of surgery.

## 2015-04-20 ENCOUNTER — Encounter (HOSPITAL_BASED_OUTPATIENT_CLINIC_OR_DEPARTMENT_OTHER)
Admission: RE | Admit: 2015-04-20 | Discharge: 2015-04-20 | Disposition: A | Discharge status: Home / Self Care | Payer: Medicare Other | Source: Ambulatory Visit | Attending: Otolaryngology | Admitting: Otolaryngology

## 2015-04-20 ENCOUNTER — Ambulatory Visit: Payer: Self-pay | Admitting: Otolaryngology

## 2015-04-20 DIAGNOSIS — E291 Testicular hypofunction: Secondary | ICD-10-CM | POA: Diagnosis not present

## 2015-04-20 DIAGNOSIS — M810 Age-related osteoporosis without current pathological fracture: Secondary | ICD-10-CM | POA: Diagnosis not present

## 2015-04-20 DIAGNOSIS — E039 Hypothyroidism, unspecified: Secondary | ICD-10-CM | POA: Diagnosis not present

## 2015-04-20 DIAGNOSIS — J383 Other diseases of vocal cords: Secondary | ICD-10-CM | POA: Diagnosis present

## 2015-04-20 DIAGNOSIS — F419 Anxiety disorder, unspecified: Secondary | ICD-10-CM | POA: Diagnosis not present

## 2015-04-20 DIAGNOSIS — C320 Malignant neoplasm of glottis: Secondary | ICD-10-CM | POA: Diagnosis not present

## 2015-04-20 DIAGNOSIS — I10 Essential (primary) hypertension: Secondary | ICD-10-CM | POA: Diagnosis not present

## 2015-04-20 DIAGNOSIS — M1990 Unspecified osteoarthritis, unspecified site: Secondary | ICD-10-CM | POA: Diagnosis not present

## 2015-04-20 DIAGNOSIS — F17220 Nicotine dependence, chewing tobacco, uncomplicated: Secondary | ICD-10-CM | POA: Diagnosis not present

## 2015-04-20 DIAGNOSIS — J449 Chronic obstructive pulmonary disease, unspecified: Secondary | ICD-10-CM | POA: Diagnosis not present

## 2015-04-20 DIAGNOSIS — R001 Bradycardia, unspecified: Secondary | ICD-10-CM | POA: Diagnosis not present

## 2015-04-20 DIAGNOSIS — F329 Major depressive disorder, single episode, unspecified: Secondary | ICD-10-CM | POA: Diagnosis not present

## 2015-04-20 DIAGNOSIS — Z79890 Hormone replacement therapy (postmenopausal): Secondary | ICD-10-CM | POA: Diagnosis not present

## 2015-04-20 DIAGNOSIS — Z7951 Long term (current) use of inhaled steroids: Secondary | ICD-10-CM | POA: Diagnosis not present

## 2015-04-20 DIAGNOSIS — K219 Gastro-esophageal reflux disease without esophagitis: Secondary | ICD-10-CM | POA: Diagnosis not present

## 2015-04-20 DIAGNOSIS — C61 Malignant neoplasm of prostate: Secondary | ICD-10-CM | POA: Diagnosis not present

## 2015-04-20 DIAGNOSIS — Z79899 Other long term (current) drug therapy: Secondary | ICD-10-CM | POA: Diagnosis not present

## 2015-04-20 DIAGNOSIS — Z79891 Long term (current) use of opiate analgesic: Secondary | ICD-10-CM | POA: Diagnosis not present

## 2015-04-20 NOTE — H&P (Signed)
Assessment  Hypothyroid (244.9) (E03.9). Laryngopharyngeal reflux (LPR) (478.79) (J38.7). Hoarseness (784.42) (R49.0). 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 Discussed  Right vocal cord lesion, suspicious for carcinoma. Recommend operative endoscopy with biopsy. We'll make recommendations following results. Reason For Visit  Hoarseness for 6-7 weeks. HPI  He has been consistently hoarse for about 6 weeks, prior to that he was worse intermittently for about 5 months. He denies any sore throat or difficulty swallowing. He quit smoking 13 years ago but has been addicted to nicotine gum ever since. He has lost about 10 pounds in the past month or so. He is on thyroid replacement for hypothyroidism. He is on a PPI b.i.d. for chronic reflux. He drinks 4 cups of coffee each morning. Allergies  Guaifenesin. Current Meds  Levothyroxine Sodium 150 MCG Oral Tablet;; RPT Hydrocodone-Acetaminophen 5-325 MG Oral Tablet;; RPT AmLODIPine Besylate 5 MG Oral Tablet;; RPT Pantoprazole Sodium 40 MG Oral Tablet Delayed Release;; RPT Dulera 100-5 MCG/ACT Inhalation Aerosol;; RPT Zometa 4 MG/100ML Intravenous Solution (Zoledronic Acid);; RPT DULoxetine HCl - 30 MG Oral Capsule Delayed Release Particles;; RPT Testosterone Cypionate 100 MG/ML Intramuscular Solution;; RPT. Active Problems  Arthritis   (716.90) (M19.90) Hypothyroid   (244.9) (E03.9) Osteoporosis   (733.00) (M81.0) Reflux gastritis   (535.40) (K29.60). [place]  Gastric Surgery Oral Surgery Tooth Extraction Shoulder Surgery Tonsillectomy. Family Hx  Bleeding disorder: Father (D66.9) Family history of essential hypertension: Mother (V38.49) (Z66.49) Family history of lung cancer: Father (V16.1) (Z80.1) Family history of migraine headaches: Father (V17.2) (Z82.0) Family history of osteoarthritis: Sister (V17.89) (Z82.69). Personal Hx  Daily caffeine consumption, 4-5 servings a day Former smoker [phone])  [phone]). ROS  Systemic: Not feeling tired (fatigue).  No fever  and no night sweats.  Recent weight loss. Head: No headache. Eyes: No eye symptoms. Otolaryngeal: No hearing loss, no earache, the ears do not feel pressured , stopped up, the ears do not feel full, no tinnitus, and no purulent nasal discharge.  No nasal passage blockage (stuffiness), no snoring, and no sneezing.  Hoarseness.  No sore throat. Cardiovascular: No chest pain or discomfort  and no palpitations. Pulmonary: No dyspnea, no cough, and no wheezing. Gastrointestinal: No dysphagia  and no heartburn.  No nausea, no abdominal pain, and no melena.  No diarrhea. Genitourinary: No dysuria. Endocrine: No muscle weakness. Musculoskeletal: No arthralgias. Neurological: No dizziness, no fainting, and no numbness. Psychological: No anxiety  and no depression. Skin: No rash. 12 system ROS was obtained and reviewed on the Health Maintenance form dated today.  Positive responses are shown above.  If the symptom is not checked, the patient has denied it. Vital Signs   Recorded by Dan Maker on 17 Apr 2015 01:54 PM BP:136/86,  BSA Calculated: 2.17 ,  BMI Calculated: 25.15 ,  Weight: 197 lb 4 oz, BMI: 25.2 kg/m2,  Height: 74.25 in. Physical Exam  APPEARANCE: A thin elderly gentleman, in no acute distress.  Normal affect, in a pleasant mood.  Oriented to time, place and person. COMMUNICATION: Hoarse and strained voice   HEAD & FACE:  No scars, lesions or masses of head and face.  Sinuses nontender to palpation.  Salivary glands without mass or tenderness.  Facial strength symmetric.  No facial lesion, scars, or mass. EYES: EOMI with normal primary gaze alignment. Visual acuity grossly intact.  PERRLA EXTERNAL EAR & NOSE: No scars, lesions or masses  EAC & TYMPANIC MEMBRANE:  EAC shows no obstructing lesions or debris and tympanic membranes are normal bilaterally  with good movement to insufflation. GROSS HEARING: Normal   TMJ:   Nontender  INTRANASAL EXAM: No polyps or purulence.  NASOPHARYNX: Normal, without lesions. LIPS, TEETH & GUMS: No lip lesions, upper and lower dentures and normal gums. ORAL CAVITY/OROPHARYNX:  Oral mucosa moist without lesion or asymmetry of the palate, tongue, tonsil or posterior pharynx. Larynx: Indirect exam a somewhat limited. Flexible fiberoptic exam reveals an irregular raised white area along the right anterior cord and the anterior commissure. The cords are mobile. NECK:  Supple without adenopathy or mass. THYROID:  Normal with no masses palpable.  NEUROLOGIC:  No gross CN deficits. No nystagmus noted.   LYMPHATIC:  No enlarged nodes palpable. Procedure  Fiberoptic Laryngoscopy Name: [name]     Age: 69 year     The risks and benefits of this procedure have been thoroughly discussed with the patient/parent.  The most commons risks outlined included but were not limited to: injury  to the nasal mucosa or throat irritation.  The patient/parent was further informed that there are other less common risks.  The patient/parent was given the opportunity to ask questions and all such questions were answered to the patient/parent's satisfaction.  Patient/parent acknowledged the risks and has agreed to proceed.   Performing Provider: Izora Gala The risks of the procedure are minimal and were discussed with the patient today. Pre-op Diagnosis: hoarseness  Post-op Diagnosis:Same Allergy:  reviewed allergies as listed Nasal Prep:Lidocaine/Afrin   Procedure:     With the patient seated in the exam chair, the R nasal cavity was intubated with the flexible laryngoscope.  The nasal cavity mucosa, nasopharynx, hypopharynx and larynx were all examined with findings as noted in the body of today's office note.  The scope was then removed.  The patient tolerated the procedure well without complication or blood loss (unless indicated in findings).   FINDINGS: . Signature  Electronically signed  by : Izora Gala  M.D.; 04/17/2015 2:56 PM EST.

## 2015-04-23 ENCOUNTER — Ambulatory Visit (HOSPITAL_BASED_OUTPATIENT_CLINIC_OR_DEPARTMENT_OTHER): Payer: Medicare Other | Admitting: Anesthesiology

## 2015-04-23 ENCOUNTER — Ambulatory Visit (HOSPITAL_BASED_OUTPATIENT_CLINIC_OR_DEPARTMENT_OTHER)
Admission: RE | Admit: 2015-04-23 | Discharge: 2015-04-23 | Disposition: A | Discharge status: Home / Self Care | Payer: Medicare Other | Source: Ambulatory Visit | Attending: Otolaryngology | Admitting: Otolaryngology

## 2015-04-23 ENCOUNTER — Encounter (HOSPITAL_BASED_OUTPATIENT_CLINIC_OR_DEPARTMENT_OTHER)
Admission: RE | Disposition: A | Discharge status: Home / Self Care | Payer: Self-pay | Source: Ambulatory Visit | Attending: Otolaryngology

## 2015-04-23 ENCOUNTER — Encounter (HOSPITAL_BASED_OUTPATIENT_CLINIC_OR_DEPARTMENT_OTHER): Payer: Self-pay

## 2015-04-23 DIAGNOSIS — K219 Gastro-esophageal reflux disease without esophagitis: Secondary | ICD-10-CM | POA: Insufficient documentation

## 2015-04-23 DIAGNOSIS — Z79890 Hormone replacement therapy (postmenopausal): Secondary | ICD-10-CM | POA: Insufficient documentation

## 2015-04-23 DIAGNOSIS — M810 Age-related osteoporosis without current pathological fracture: Secondary | ICD-10-CM | POA: Insufficient documentation

## 2015-04-23 DIAGNOSIS — F419 Anxiety disorder, unspecified: Secondary | ICD-10-CM | POA: Insufficient documentation

## 2015-04-23 DIAGNOSIS — Z79891 Long term (current) use of opiate analgesic: Secondary | ICD-10-CM | POA: Insufficient documentation

## 2015-04-23 DIAGNOSIS — E039 Hypothyroidism, unspecified: Secondary | ICD-10-CM | POA: Insufficient documentation

## 2015-04-23 DIAGNOSIS — F17220 Nicotine dependence, chewing tobacco, uncomplicated: Secondary | ICD-10-CM | POA: Diagnosis not present

## 2015-04-23 DIAGNOSIS — J449 Chronic obstructive pulmonary disease, unspecified: Secondary | ICD-10-CM | POA: Insufficient documentation

## 2015-04-23 DIAGNOSIS — F329 Major depressive disorder, single episode, unspecified: Secondary | ICD-10-CM | POA: Insufficient documentation

## 2015-04-23 DIAGNOSIS — J383 Other diseases of vocal cords: Secondary | ICD-10-CM | POA: Diagnosis not present

## 2015-04-23 DIAGNOSIS — M1990 Unspecified osteoarthritis, unspecified site: Secondary | ICD-10-CM | POA: Insufficient documentation

## 2015-04-23 DIAGNOSIS — R001 Bradycardia, unspecified: Secondary | ICD-10-CM | POA: Insufficient documentation

## 2015-04-23 DIAGNOSIS — Z7951 Long term (current) use of inhaled steroids: Secondary | ICD-10-CM | POA: Insufficient documentation

## 2015-04-23 DIAGNOSIS — Z79899 Other long term (current) drug therapy: Secondary | ICD-10-CM | POA: Insufficient documentation

## 2015-04-23 DIAGNOSIS — C320 Malignant neoplasm of glottis: Secondary | ICD-10-CM

## 2015-04-23 DIAGNOSIS — I10 Essential (primary) hypertension: Secondary | ICD-10-CM | POA: Insufficient documentation

## 2015-04-23 HISTORY — DX: Malignant neoplasm of glottis: C32.0

## 2015-04-23 HISTORY — DX: Gastro-esophageal reflux disease without esophagitis: K21.9

## 2015-04-23 HISTORY — PX: MICROLARYNGOSCOPY: SHX5208

## 2015-04-23 HISTORY — DX: Essential (primary) hypertension: I10

## 2015-04-23 HISTORY — PX: OTHER SURGICAL HISTORY: SHX169

## 2015-04-23 LAB — POCT I-STAT, CHEM 8
BUN: 15 mg/dL (ref 6–20)
CALCIUM ION: 1.16 mmol/L (ref 1.13–1.30)
CHLORIDE: 101 mmol/L (ref 101–111)
CREATININE: 0.9 mg/dL (ref 0.61–1.24)
Glucose, Bld: 94 mg/dL (ref 65–99)
HEMATOCRIT: 46 % (ref 39.0–52.0)
HEMOGLOBIN: 15.6 g/dL (ref 13.0–17.0)
POTASSIUM: 4.3 mmol/L (ref 3.5–5.1)
Sodium: 139 mmol/L (ref 135–145)
TCO2: 25 mmol/L (ref 0–100)

## 2015-04-23 SURGERY — MICROLARYNGOSCOPY
Anesthesia: General | Site: Throat | Laterality: Right

## 2015-04-23 MED ORDER — LACTATED RINGERS IV SOLN
INTRAVENOUS | Status: DC
  Administered 2015-04-23 (×2): via INTRAVENOUS

## 2015-04-23 MED ORDER — OXYCODONE HCL 5 MG/5ML PO SOLN: 5.0000 mg | Freq: Once | ORAL | Status: AC | PRN

## 2015-04-23 MED ORDER — LIDOCAINE-EPINEPHRINE 1 %-1:100000 IJ SOLN
INTRAMUSCULAR | Status: DC | PRN
  Administered 2015-04-23: 7 mL

## 2015-04-23 MED ORDER — ONDANSETRON HCL 4 MG/2ML IJ SOLN: 4.0000 mg | Freq: Four times a day (QID) | INTRAMUSCULAR | Status: DC | PRN

## 2015-04-23 MED ORDER — FENTANYL CITRATE (PF) 100 MCG/2ML IJ SOLN
INTRAMUSCULAR | Status: AC
  Filled 2015-04-23: qty 6

## 2015-04-23 MED ORDER — ONDANSETRON HCL 4 MG/2ML IJ SOLN
INTRAMUSCULAR | Status: DC | PRN
  Administered 2015-04-23: 4 mg via INTRAVENOUS

## 2015-04-23 MED ORDER — LIDOCAINE-EPINEPHRINE 1 %-1:100000 IJ SOLN
INTRAMUSCULAR | Status: AC
  Filled 2015-04-23: qty 1

## 2015-04-23 MED ORDER — DEXAMETHASONE SODIUM PHOSPHATE 4 MG/ML IJ SOLN
INTRAMUSCULAR | Status: DC | PRN
Start: 2015-04-23 — End: 2015-04-23
  Administered 2015-04-23: 10 mg via INTRAVENOUS

## 2015-04-23 MED ORDER — SUCCINYLCHOLINE CHLORIDE 20 MG/ML IJ SOLN
INTRAMUSCULAR | Status: DC | PRN
Start: 2015-04-23 — End: 2015-04-23
  Administered 2015-04-23: 100 mg via INTRAVENOUS

## 2015-04-23 MED ORDER — FENTANYL CITRATE (PF) 100 MCG/2ML IJ SOLN
25.0000 ug | INTRAMUSCULAR | Status: DC | PRN
  Administered 2015-04-23: 25 ug via INTRAVENOUS
  Administered 2015-04-23: 50 ug via INTRAVENOUS

## 2015-04-23 MED ORDER — FENTANYL CITRATE (PF) 100 MCG/2ML IJ SOLN
INTRAMUSCULAR | Status: DC | PRN
  Administered 2015-04-23: 100 ug via INTRAVENOUS

## 2015-04-23 MED ORDER — MIDAZOLAM HCL 2 MG/2ML IJ SOLN
INTRAMUSCULAR | Status: AC
  Filled 2015-04-23: qty 2

## 2015-04-23 MED ORDER — GLYCOPYRROLATE 0.2 MG/ML IJ SOLN
0.2000 mg | Freq: Once | INTRAMUSCULAR | Status: AC | PRN
  Administered 2015-04-23: 0.2 mg via INTRAVENOUS

## 2015-04-23 MED ORDER — FENTANYL CITRATE (PF) 100 MCG/2ML IJ SOLN
INTRAMUSCULAR | Status: AC
  Filled 2015-04-23: qty 2

## 2015-04-23 MED ORDER — MIDAZOLAM HCL 2 MG/2ML IJ SOLN: 1.0000 mg | INTRAMUSCULAR | Status: DC | PRN

## 2015-04-23 MED ORDER — EPINEPHRINE HCL 1 MG/ML IJ SOLN
INTRAMUSCULAR | Status: DC | PRN
  Administered 2015-04-23: 1 mg via ENDOTRACHEOPULMONARY

## 2015-04-23 MED ORDER — PROPOFOL 10 MG/ML IV BOLUS
INTRAVENOUS | Status: DC | PRN
  Administered 2015-04-23: 150 mg via INTRAVENOUS

## 2015-04-23 MED ORDER — FENTANYL CITRATE (PF) 100 MCG/2ML IJ SOLN: 50.0000 ug | INTRAMUSCULAR | Status: DC | PRN

## 2015-04-23 MED ORDER — OXYCODONE HCL 5 MG PO TABS
5.0000 mg | ORAL_TABLET | Freq: Once | ORAL | Status: AC | PRN
  Administered 2015-04-23: 5 mg via ORAL

## 2015-04-23 MED ORDER — OXYCODONE HCL 5 MG PO TABS
ORAL_TABLET | ORAL | Status: AC
  Filled 2015-04-23: qty 1

## 2015-04-23 MED ORDER — EPINEPHRINE HCL 1 MG/ML IJ SOLN
INTRAMUSCULAR | Status: AC
  Filled 2015-04-23: qty 1

## 2015-04-23 SURGICAL SUPPLY — 26 items
CANISTER SUCT 1200ML W/VALVE (MISCELLANEOUS) ×3 IMPLANT
GLOVE BIO SURGEON STRL SZ7 (GLOVE) ×3 IMPLANT
GLOVE BIOGEL PI IND STRL 7.5 (GLOVE) ×1 IMPLANT
GLOVE BIOGEL PI INDICATOR 7.5 (GLOVE) ×2
GLOVE ECLIPSE 7.5 STRL STRAW (GLOVE) ×3 IMPLANT
GOWN STRL REUS W/ TWL LRG LVL3 (GOWN DISPOSABLE) IMPLANT
GOWN STRL REUS W/ TWL XL LVL3 (GOWN DISPOSABLE) IMPLANT
GOWN STRL REUS W/TWL LRG LVL3 (GOWN DISPOSABLE)
GOWN STRL REUS W/TWL XL LVL3 (GOWN DISPOSABLE)
GUARD TEETH (MISCELLANEOUS) ×3 IMPLANT
MARKER SKIN DUAL TIP RULER LAB (MISCELLANEOUS) IMPLANT
NEEDLE HYPO 18GX1.5 BLUNT FILL (NEEDLE) ×6 IMPLANT
NEEDLE SPNL 22GX7 QUINCKE BK (NEEDLE) ×3 IMPLANT
NS IRRIG 1000ML POUR BTL (IV SOLUTION) ×3 IMPLANT
PAD ALCOHOL SWAB (MISCELLANEOUS) ×3 IMPLANT
PATTIES SURGICAL .5 X3 (DISPOSABLE) ×3 IMPLANT
SHEET MEDIUM DRAPE 40X70 STRL (DRAPES) ×3 IMPLANT
SLEEVE SCD COMPRESS KNEE MED (MISCELLANEOUS) IMPLANT
SOLUTION BUTLER CLEAR DIP (MISCELLANEOUS) IMPLANT
SPONGE GAUZE 4X4 12PLY STER LF (GAUZE/BANDAGES/DRESSINGS) ×3 IMPLANT
SYR 5ML LL (SYRINGE) ×3 IMPLANT
SYR CONTROL 10ML LL (SYRINGE) IMPLANT
SYR TB 1ML LL NO SAFETY (SYRINGE) ×6 IMPLANT
TOWEL OR 17X24 6PK STRL BLUE (TOWEL DISPOSABLE) IMPLANT
TUBE CONNECTING 20'X1/4 (TUBING)
TUBE CONNECTING 20X1/4 (TUBING) IMPLANT

## 2015-04-23 NOTE — Discharge Instructions (Signed)
Resume normal diet as you are able to. You may use your voice but do not use it excessively and avoid screaming or straining. Dr. Constance Holster will call you with results in a few days.    Post Anesthesia Home Care Instructions  Activity: Get plenty of rest for the remainder of the day. A responsible adult should stay with you for 24 hours following the procedure.  For the next 24 hours, DO NOT: -Drive a car -Paediatric nurse -Drink alcoholic beverages -Take any medication unless instructed by your physician -Make any legal decisions or sign important papers.  Meals: Start with liquid foods such as gelatin or soup. Progress to regular foods as tolerated. Avoid greasy, spicy, heavy foods. If nausea and/or vomiting occur, drink only clear liquids until the nausea and/or vomiting subsides. Call your physician if vomiting continues.  Special Instructions/Symptoms: Your throat may feel dry or sore from the anesthesia or the breathing tube placed in your throat during surgery. If this causes discomfort, gargle with warm salt water. The discomfort should disappear within 24 hours.  If you had a scopolamine patch placed behind your ear for the management of post- operative nausea and/or vomiting:  1. The medication in the patch is effective for 72 hours, after which it should be removed.  Wrap patch in a tissue and discard in the trash. Wash hands thoroughly with soap and water. 2. You may remove the patch earlier than 72 hours if you experience unpleasant side effects which may include dry mouth, dizziness or visual disturbances. 3. Avoid touching the patch. Wash your hands with soap and water after contact with the patch.  Call your surgeon if you experience:   1.  Fever over 101.0. 2.  Inability to urinate. 3.  Nausea and/or vomiting. 4.  Extreme swelling or bruising at the surgical site. 5.  Continued bleeding from the incision. 6.  Increased pain, redness or drainage from the incision. 7.   Problems related to your pain medication. 8. Any change in color, movement and/or sensation 9. Any problems and/or concerns

## 2015-04-23 NOTE — Anesthesia Postprocedure Evaluation (Signed)
Anesthesia Post Note  Patient: [name]  Procedure(s) Performed: Procedure(s) (LRB): MICROLARYNGOSCOPY WITH BIOPSY RIGHT TRUE VOCAL CORD (Right)  Anesthesia type: General  Patient location: PACU  Post pain: Pain level controlled and Adequate analgesia  Post assessment: Post-op Vital signs reviewed, Patient's Cardiovascular Status Stable, Respiratory Function Stable, Patent Airway and Pain level controlled  Last Vitals:  Filed Vitals:   04/23/15 1022  BP: 155/87  Pulse: 70  Temp: 36.4 C  Resp: 16    Post vital signs: Reviewed and stable  Level of consciousness: awake, alert  and oriented  Complications: No apparent anesthesia complications

## 2015-04-23 NOTE — H&P (View-Only) (Signed)
Assessment  Hypothyroid (244.9) (E03.9). Laryngopharyngeal reflux (LPR) (478.79) (J38.7). Hoarseness (784.42) (R49.0). 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 Discussed  Right vocal cord lesion, suspicious for carcinoma. Recommend operative endoscopy with biopsy. We'll make recommendations following results. Reason For Visit  Hoarseness for 6-7 weeks. HPI  He has been consistently hoarse for about 6 weeks, prior to that he was worse intermittently for about 5 months. He denies any sore throat or difficulty swallowing. He quit smoking 13 years ago but has been addicted to nicotine gum ever since. He has lost about 10 pounds in the past month or so. He is on thyroid replacement for hypothyroidism. He is on a PPI b.i.d. for chronic reflux. He drinks 4 cups of coffee each morning. Allergies  Guaifenesin. Current Meds  Levothyroxine Sodium 150 MCG Oral Tablet;; RPT Hydrocodone-Acetaminophen 5-325 MG Oral Tablet;; RPT AmLODIPine Besylate 5 MG Oral Tablet;; RPT Pantoprazole Sodium 40 MG Oral Tablet Delayed Release;; RPT Dulera 100-5 MCG/ACT Inhalation Aerosol;; RPT Zometa 4 MG/100ML Intravenous Solution (Zoledronic Acid);; RPT DULoxetine HCl - 30 MG Oral Capsule Delayed Release Particles;; RPT Testosterone Cypionate 100 MG/ML Intramuscular Solution;; RPT. Active Problems  Arthritis   (716.90) (M19.90) Hypothyroid   (244.9) (E03.9) Osteoporosis   (733.00) (M81.0) Reflux gastritis   (535.40) (K29.60). [place]  Gastric Surgery Oral Surgery Tooth Extraction Shoulder Surgery Tonsillectomy. Family Hx  Bleeding disorder: Father (D77.9) Family history of essential hypertension: Mother (V66.49) (Z81.49) Family history of lung cancer: Father (V16.1) (Z80.1) Family history of migraine headaches: Father (V17.2) (Z82.0) Family history of osteoarthritis: Sister (V17.89) (Z82.69). Personal Hx  Daily caffeine consumption, 4-5 servings a day Former smoker [id number]  [phone]). ROS  Systemic: Not feeling tired (fatigue).  No fever  and no night sweats.  Recent weight loss. Head: No headache. Eyes: No eye symptoms. Otolaryngeal: No hearing loss, no earache, the ears do not feel pressured , stopped up, the ears do not feel full, no tinnitus, and no purulent nasal discharge.  No nasal passage blockage (stuffiness), no snoring, and no sneezing.  Hoarseness.  No sore throat. Cardiovascular: No chest pain or discomfort  and no palpitations. Pulmonary: No dyspnea, no cough, and no wheezing. Gastrointestinal: No dysphagia  and no heartburn.  No nausea, no abdominal pain, and no melena.  No diarrhea. Genitourinary: No dysuria. Endocrine: No muscle weakness. Musculoskeletal: No arthralgias. Neurological: No dizziness, no fainting, and no numbness. Psychological: No anxiety  and no depression. Skin: No rash. 12 system ROS was obtained and reviewed on the Health Maintenance form dated today.  Positive responses are shown above.  If the symptom is not checked, the patient has denied it. Vital Signs   Recorded by Dan Maker on 17 Apr 2015 01:54 PM BP:136/86,  BSA Calculated: 2.17 ,  BMI Calculated: 25.15 ,  Weight: 197 lb 4 oz, BMI: 25.2 kg/m2,  Height: 74.25 in. Physical Exam  APPEARANCE: A thin elderly gentleman, in no acute distress.  Normal affect, in a pleasant mood.  Oriented to time, place and person. COMMUNICATION: Hoarse and strained voice   HEAD & FACE:  No scars, lesions or masses of head and face.  Sinuses nontender to palpation.  Salivary glands without mass or tenderness.  Facial strength symmetric.  No facial lesion, scars, or mass. EYES: EOMI with normal primary gaze alignment. Visual acuity grossly intact.  PERRLA EXTERNAL EAR & NOSE: No scars, lesions or masses  EAC & TYMPANIC MEMBRANE:  EAC shows no obstructing lesions or debris and tympanic membranes are normal bilaterally  with good movement to insufflation. GROSS HEARING: Normal   TMJ:   Nontender  INTRANASAL EXAM: No polyps or purulence.  NASOPHARYNX: Normal, without lesions. LIPS, TEETH & GUMS: No lip lesions, upper and lower dentures and normal gums. ORAL CAVITY/OROPHARYNX:  Oral mucosa moist without lesion or asymmetry of the palate, tongue, tonsil or posterior pharynx. Larynx: Indirect exam a somewhat limited. Flexible fiberoptic exam reveals an irregular raised white area along the right anterior cord and the anterior commissure. The cords are mobile. NECK:  Supple without adenopathy or mass. THYROID:  Normal with no masses palpable.  NEUROLOGIC:  No gross CN deficits. No nystagmus noted.   LYMPHATIC:  No enlarged nodes palpable. Procedure  Fiberoptic Laryngoscopy Name: Gunnar Hereford     Age: 69 year     The risks and benefits of this procedure have been thoroughly discussed with the patient/parent.  The most commons risks outlined included but were not limited to: injury  to the nasal mucosa or throat irritation.  The patient/parent was further informed that there are other less common risks.  The patient/parent was given the opportunity to ask questions and all such questions were answered to the patient/parent's satisfaction.  Patient/parent acknowledged the risks and has agreed to proceed.   Performing Provider: Izora Gala The risks of the procedure are minimal and were discussed with the patient today. Pre-op Diagnosis: hoarseness  Post-op Diagnosis:Same Allergy:  reviewed allergies as listed Nasal Prep:Lidocaine/Afrin   Procedure:     With the patient seated in the exam chair, the R nasal cavity was intubated with the flexible laryngoscope.  The nasal cavity mucosa, nasopharynx, hypopharynx and larynx were all examined with findings as noted in the body of today's office note.  The scope was then removed.  The patient tolerated the procedure well without complication or blood loss (unless indicated in findings).   FINDINGS: . Signature  Electronically signed  by : Izora Gala  M.D.; 04/17/2015 2:56 PM EST.

## 2015-04-23 NOTE — Op Note (Signed)
OPERATIVE REPORT  DATE OF SURGERY: 04/23/2015  PATIENT:  [name],  69 y.o. male  PRE-OPERATIVE DIAGNOSIS:  VOCAL CORD LESION RIGHT  POST-OPERATIVE DIAGNOSIS:  VOCAL CORD LESION RIGHT  PROCEDURE:  Procedure(s): MICROLARYNGOSCOPY WITH BIOPSY RIGHT TRUE VOCAL CORD  SURGEON:  Beckie Salts, MD  ASSISTANTS: none  ANESTHESIA:   General   EBL:  3 ml  DRAINS: none  LOCAL MEDICATIONS USED:  1% Xylocaine with epinephrine, topical adrenaline solution  SPECIMEN:  Right true vocal cord stripping  COUNTS:  Correct  PROCEDURE DETAILS: The patient was taken to the operating room and placed on the operating table in the supine position. Following induction of general endotracheal anesthesia, the table was turned 90 and the patient was draped in a standard fashion. The patient was edentulous. A Jako laryngoscope was entered into the oral cavity and used to examine the larynx. This was attached to the Cresco stand with the suspension apparatus. The operating microscope was brought into the operative field. The larynx was inspected. There is a raised papillary type lesion involving the right true vocal cord from just anterior to the vocal process to just posterior to the anterior commissure. It involved the lower aspect of the cord itself but did not involve the subglottis. It involved the superior aspect of the cord but did not extend into the ventricle. The cord was infiltrated with local anesthetic solution. Microlaryngoscopy scissors were then used to create the posterior and the superior mucosal incisions down to the vocal ligament. The cord was stripped completely in 2 pieces. Grossly, the entire lesion was removed. This was sent for pathologic evaluation. The vocal ligament was kept intact. There is no obvious gross deep extension of this tumor. Topical adrenaline was applied on pledgets for hemostasis. The remainder of the larynx appeared to be free of any lesions. The scope was removed. The  patient was awakened extubated and transferred to recovery in stable condition.    PATIENT DISPOSITION:  To PACU, stable

## 2015-04-23 NOTE — Transfer of Care (Signed)
Immediate Anesthesia Transfer of Care Note  Patient: [name]  Procedure(s) Performed: Procedure(s): MICROLARYNGOSCOPY WITH BIOPSY RIGHT TRUE VOCAL CORD (Right)  Patient Location: PACU  Anesthesia Type:General  Level of Consciousness: awake, alert  and oriented  Airway & Oxygen Therapy: Patient Spontanous Breathing and Patient connected to face mask oxygen  Post-op Assessment: Report given to RN and Post -op Vital signs reviewed and stable  Post vital signs: Reviewed and stable  Last Vitals:  Filed Vitals:   04/23/15 0712  BP: 180/95  Pulse: 57  Temp: 36.5 C  Resp: 20    Complications: No apparent anesthesia complications

## 2015-04-23 NOTE — Interval H&P Note (Signed)
History and Physical Interval Note:  04/23/2015 7:52 AM  [name]  has presented today for surgery, with the diagnosis of VOCAL CORD LESION   The various methods of treatment have been discussed with the patient and family. After consideration of risks, benefits and other options for treatment, the patient has consented to  Procedure(s): MICROLARYNGOSCOPY WITH BIOPSY  (N/A) as a surgical intervention .  The patient's history has been reviewed, patient examined, no change in status, stable for surgery.  I have reviewed the patient's chart and labs.  Questions were answered to the patient's satisfaction.     Burnett Lieber

## 2015-04-23 NOTE — Anesthesia Procedure Notes (Signed)
Procedure Name: Intubation Date/Time: 04/23/2015 8:23 AM Performed by: Melynda Ripple D Pre-anesthesia Checklist: Patient identified, Emergency Drugs available, Suction available and Patient being monitored Patient Re-evaluated:Patient Re-evaluated prior to inductionOxygen Delivery Method: Circle System Utilized Preoxygenation: Pre-oxygenation with 100% oxygen Intubation Type: IV induction Ventilation: Mask ventilation without difficulty Laryngoscope Size: Mac and 3 Grade View: Grade I Tube type: Oral Tube size: 6.5 mm Number of attempts: 1 Airway Equipment and Method: Stylet and Oral airway Placement Confirmation: ETT inserted through vocal cords under direct vision,  positive ETCO2 and breath sounds checked- equal and bilateral Secured at: 26 cm Tube secured with: Tape Dental Injury: Teeth and Oropharynx as per pre-operative assessment

## 2015-04-23 NOTE — Anesthesia Preprocedure Evaluation (Signed)
Anesthesia Evaluation  Patient identified by MRN, date of birth, ID band Patient awake    Reviewed: Allergy & Precautions, NPO status , Patient's Chart, lab work & pertinent test results  Airway Mallampati: II   Neck ROM: full    Dental   Pulmonary COPDformer smoker,  breath sounds clear to auscultation        Cardiovascular hypertension, Rhythm:regular Rate:Normal     Neuro/Psych Anxiety Depression    GI/Hepatic GERD-  ,  Endo/Other  Hypothyroidism   Renal/GU      Musculoskeletal  (+) Arthritis -,   Abdominal   Peds  Hematology   Anesthesia Other Findings   Reproductive/Obstetrics                             Anesthesia Physical Anesthesia Plan  ASA: III  Anesthesia Plan: General   Post-op Pain Management:    Induction: Intravenous  Airway Management Planned: Oral ETT  Additional Equipment:   Intra-op Plan:   Post-operative Plan: Extubation in OR  Informed Consent: I have reviewed the patients History and Physical, chart, labs and discussed the procedure including the risks, benefits and alternatives for the proposed anesthesia with the patient or authorized representative who has indicated his/her understanding and acceptance.     Plan Discussed with: CRNA, Anesthesiologist and Surgeon  Anesthesia Plan Comments:         Anesthesia Quick Evaluation

## 2015-04-24 ENCOUNTER — Encounter (HOSPITAL_BASED_OUTPATIENT_CLINIC_OR_DEPARTMENT_OTHER): Payer: Self-pay | Admitting: Otolaryngology

## 2015-04-25 DIAGNOSIS — N400 Enlarged prostate without lower urinary tract symptoms: Secondary | ICD-10-CM | POA: Diagnosis not present

## 2015-04-25 DIAGNOSIS — E291 Testicular hypofunction: Secondary | ICD-10-CM | POA: Diagnosis not present

## 2015-04-25 DIAGNOSIS — M810 Age-related osteoporosis without current pathological fracture: Secondary | ICD-10-CM | POA: Diagnosis not present

## 2015-05-15 ENCOUNTER — Telehealth: Payer: Self-pay | Admitting: *Deleted

## 2015-05-15 NOTE — Telephone Encounter (Signed)
Oncology Nurse Navigator Documentation  Oncology Nurse Navigator Flowsheets 05/15/2015  Referral date to RadOnc/MedOnc 05/15/2015  Navigator Encounter Type Introductory phone call  Placed introductory call to new referral patient.  Patient not available, spoke with his wife. 1. Introduced myself as the oncology nurse navigator that works with Dr. Valere Dross to whom he has been referred by Dr. Constance Holster and with whom he has an appt next Thursday.  Wife confirmed understanding of referral and appt to see Dr. Valere Dross next week Thursday. 2. I briefly explained my role as a navigator, I indicated that I would be joining them during appt next week. 3. I confirmed understanding of the [facility] location, explained the arrival and RadOnc registration process for her appt. 4. I provided my contact information, encouraged a call with questions/concerns before next week. 5. She verbalized understanding of information provided, said she would let her husband know I called. 6. I indicated I would call again next week.  She expressed appreciation for my call.     Time Spent with Patient [place], RN, BSN, [place] [place] [phone]

## 2015-05-23 ENCOUNTER — Encounter: Payer: Self-pay | Admitting: Radiation Oncology

## 2015-05-23 NOTE — Progress Notes (Signed)
Head and Neck Cancer Location of Tumor / Histology: Vocal Cord, Right True  Mr. [name] presented to Dr. To Dr. Izora Gala on 04/17/2015 with a 6-7 weeks history of hoarseness, and prior to that he was worse intermittently  for about 5 months.  At the time of this visit he denied any sore throat or difficulty swallowing.  Flexible fiberoptic exam revealed an irregular raised white area along the right anterior cord and the anterior commissure. The cords were mobile during this procedure.  "Hoarse and Strained voice."  Biopsies of Vocal cord, Biopsy, Right True (if applicable) revealed:  12/06/24  Vocal core, biopsy, right true Invasive Squamous Cell Carcinoma  Nutrition Status Yes No Comments  Weight changes? [x]  []  Lost ~ 8 lbs.  Normal weight 205  Swallowing concerns? [x]  []  Mild discomfort-eating whatever he wants  PEG? []  [x]     Referrals Yes No Comments  Social Work? []  []    Dentistry? []  []  Dentures  Swallowing therapy? []  []    Nutrition? []  []    Med/Onc? []  []     Safety Issues Yes No Comments  Prior radiation? []  [x]    Pacemaker/ICD? []  [x]    Possible current pregnancy? []  [x]    Is the patient on methotrexate? []  [x]     Tobacco/Marijuana/Snuff/ETOH use: Former smoker.  Quit ~12/01/2001, Chewing Tobacco Dependency. Nicorette Gum   Past/Anticipated interventions by otolaryngology, if any: Biopsy of the Right True Vocal Cord  Past/Anticipated interventions by medical oncology, if any: Unknown at this time    Current Complaints / other details:  No chief complaint on file.

## 2015-05-24 ENCOUNTER — Ambulatory Visit
Admission: RE | Admit: 2015-05-24 | Discharge: 2015-05-24 | Disposition: A | Discharge status: Home / Self Care | Payer: Medicare Other | Source: Ambulatory Visit | Attending: Radiation Oncology | Admitting: Radiation Oncology

## 2015-05-24 ENCOUNTER — Encounter: Payer: Self-pay | Admitting: *Deleted

## 2015-05-24 ENCOUNTER — Telehealth: Payer: Self-pay | Admitting: *Deleted

## 2015-05-24 ENCOUNTER — Encounter: Payer: Self-pay | Admitting: Radiation Oncology

## 2015-05-24 VITALS — BP 118/72 | HR 79 | Temp 98.9°F | Ht 75.0 in | Wt 193.2 lb

## 2015-05-24 DIAGNOSIS — J449 Chronic obstructive pulmonary disease, unspecified: Secondary | ICD-10-CM | POA: Diagnosis not present

## 2015-05-24 DIAGNOSIS — Z801 Family history of malignant neoplasm of trachea, bronchus and lung: Secondary | ICD-10-CM | POA: Diagnosis not present

## 2015-05-24 DIAGNOSIS — C320 Malignant neoplasm of glottis: Secondary | ICD-10-CM | POA: Insufficient documentation

## 2015-05-24 DIAGNOSIS — I10 Essential (primary) hypertension: Secondary | ICD-10-CM | POA: Diagnosis not present

## 2015-05-24 DIAGNOSIS — E039 Hypothyroidism, unspecified: Secondary | ICD-10-CM | POA: Insufficient documentation

## 2015-05-24 DIAGNOSIS — F419 Anxiety disorder, unspecified: Secondary | ICD-10-CM | POA: Insufficient documentation

## 2015-05-24 DIAGNOSIS — K2960 Other gastritis without bleeding: Secondary | ICD-10-CM | POA: Diagnosis not present

## 2015-05-24 DIAGNOSIS — Z510 Encounter for antineoplastic radiation therapy: Secondary | ICD-10-CM | POA: Diagnosis not present

## 2015-05-24 DIAGNOSIS — F329 Major depressive disorder, single episode, unspecified: Secondary | ICD-10-CM | POA: Insufficient documentation

## 2015-05-24 DIAGNOSIS — K219 Gastro-esophageal reflux disease without esophagitis: Secondary | ICD-10-CM | POA: Diagnosis not present

## 2015-05-24 DIAGNOSIS — M1990 Unspecified osteoarthritis, unspecified site: Secondary | ICD-10-CM | POA: Diagnosis not present

## 2015-05-24 DIAGNOSIS — Z87891 Personal history of nicotine dependence: Secondary | ICD-10-CM | POA: Diagnosis not present

## 2015-05-24 DIAGNOSIS — Z8042 Family history of malignant neoplasm of prostate: Secondary | ICD-10-CM | POA: Diagnosis not present

## 2015-05-24 HISTORY — DX: Malignant neoplasm of glottis: C32.0

## 2015-05-24 HISTORY — DX: Tobacco use: Z72.0

## 2015-05-24 HISTORY — DX: Dysphonia: R49.0

## 2015-05-24 HISTORY — DX: Other gastritis without bleeding: K29.60

## 2015-05-24 NOTE — Progress Notes (Signed)
Oncology Nurse Navigator Documentation  Oncology Nurse Navigator Flowsheets 05/24/2015  Referral date to RadOnc/MedOnc -  Navigator Encounter Type Initial RadOnc  Met with patient during initial consult with Dr. Murray.  He was accompanied by his wife.   1. Further introduced myself as his Navigator, explained my role as a member of the Care Team, provided contact information, encouraged them to contact me with questions/concerns as treatments/procedures begin. 2. Provided New Patient Information packet:  Contact information for physician and navigator, other members of Care Team.  Advance Directive information ([facility] blue pamphlet).  He indicated has ADs, will bring copies next visit.  Fall Prevention Patient Safety Plan  Appointment Guideline  [facility] campus map with highlight of [facility] 3. Provided introductory explanation of radiation treatment including SIM planning and fitting of head mask, showed them example of mask.   4. Provided a tour of SIM and LINAC areas, explained treatment and arrival procedures.   They verbalized understanding of information provided.     Time Spent with Patient 60   Rick Diehl, RN, BSN, CHPN Head & Neck Oncology Navigator [facility] [place] [phone]  

## 2015-05-24 NOTE — Progress Notes (Signed)
[facility] NEW PATIENT EVALUATION  Name: [name] MRN: [id number]  Date:   05/24/2015           DOB: 11-07-1946  Status: outpatient   CC: Reginia Naas, MD  Izora Gala, MD , Dr. 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 male who is seen today through the courtesy of Dr. Constance Holster for evaluation of his T1 N0 squamous cell carcinoma of the right to vocal cord.  He presented with a 5 month history of intermittent hoarseness, then becoming constant over 6 weeks.  Dr. Constance Holster noted irregularity along the right true vocal cord extending to the anterior commissure.  On 04/23/2015 he underwent microlaryngoscopy and was found to have a carcinoma involving the right vocal cord extending to but not involving the anterior commissure.  There is no supra or subglottic extension.  Pathology was diagnostic for well to moderately differentiated carcinoma.  Of note is that he stopped smoking in 2003.  He does undergo low-dose lung cancer screening through Dr. Asencion Noble.  Of note is that he is on thyroid supplementation.  PREVIOUS RADIATION THERAPY: No   PAST MEDICAL HISTORY:  has a past medical history of Anxiety state, unspecified; Chronic airway obstruction, not elsewhere classified; Depressive disorder, not elsewhere classified; Hypothyroidism; Osteoporosis; Hypertension; GERD (gastroesophageal reflux disease); Arthritis; Squamous cell carcinoma of right vocal cord (04/23/2015); Hoarseness of voice; Reflux gastritis; and Tobacco chew use.     PAST SURGICAL HISTORY:  Past Surgical History  Procedure Laterality Date  . Shoulder surgery    . Abdominal surgery  1971    terminal ileum removed, ileitis   . Tonsillectomy      as child  . Microlaryngoscopy Right 04/23/2015    Procedure:  MICROLARYNGOSCOPY WITH BIOPSY RIGHT TRUE VOCAL CORD;  Surgeon: Izora Gala, MD;  Location: [facility];  Service: ENT;  Laterality: Right;  . Biospy of the right true vocal cord Right 04/23/2015     FAMILY HISTORY: family history includes Allergies in his sister; Lung cancer in his father; Prostate cancer in his father.  His father died of what was thought to be metastatic prostate cancer at age 8.  His mother is alive with some degree of dementia at 2, lives with his sister.   SOCIAL HISTORY:  reports that he quit smoking about 13 years ago. His smoking use included Cigarettes. He has a 80 pack-year smoking history. He has never used smokeless tobacco. He reports that he does not drink alcohol or use illicit drugs.  Married, 2 children.  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   ALLERGIES: Doxycycline and Guaifenesin   MEDICATIONS:  Current Outpatient Prescriptions  Medication Sig Dispense Refill  . albuterol (VENTOLIN HFA) 108 (90 BASE) MCG/ACT inhaler Inhale 2 puffs into the lungs every 6 (six) hours as needed. 1 Inhaler 3  . amLODipine (NORVASC) 5 MG tablet Take 5 mg by mouth daily.    . DULoxetine (CYMBALTA) 30 MG capsule Take 3 capsules by mouth daily.    [name] Kitchen levothyroxine (SYNTHROID, LEVOTHROID) 150 MCG tablet Take 150 mcg by mouth daily.      . mometasone-formoterol (DULERA) 100-5 MCG/ACT AERO Take 2 puffs first thing in am and then another 2 puffs about 12  hours later. 3 Inhaler 4  . nicotine polacrilex (NICORETTE) 4 MG gum Take 4 mg by mouth as needed for smoking cessation.    [name] Kitchen oxyCODONE-acetaminophen (PERCOCET/ROXICET) 5-325 MG per tablet Take 2 tablets by mouth 4 (four) times daily.     . pantoprazole (PROTONIX) 40 MG tablet Take 1 tablet (40 mg total) by mouth daily. Take 30-60 min before first meal of the day 30 tablet 2  . ranitidine (ZANTAC) 150 MG capsule One at bedtime 30 capsule 11  . testosterone cypionate (DEPOTESTOTERONE  CYPIONATE) 200 MG/ML injection Inject 0.5 mLs into the muscle once a week.    . Zoledronic Acid (ZOMETA IV) Inject into the vein every 3 (three) months.     No current facility-administered medications for this encounter.     REVIEW OF SYSTEMS:  Pertinent items are noted in HPI.    PHYSICAL EXAM:  height is 6\' 3"  (1.905 m) and weight is 193 lb 3.2 oz (87.635 kg). His temperature is 98.9 F (37.2 C). His blood pressure is 118/72 and his pulse is 79. His oxygen saturation is 97%.   Alert, oriented, and slightly hoarse white male appearing his stated age.  Head and neck examination: Oral cavity and oropharynx are unremarkable to inspection.  He is edentulous.  On indirect mirror examination there is diffuse erythema along the right true vocal cord without any mass effect or ulceration.  Both true vocal cords move well.  Nodes: There is no palpable lymphadenopathy in the neck.  Neurologic examination: Grossly nonfocal.   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  We discussed laser surgery versus radiation therapy.  Both options are equally curative, but radiation therapy may provide him with improved voice quality.  I do recommend radiation therapy.  We discussed the potential acute and late toxicities of radiation therapy.  He can expect some degree of throat pain/odynophagia from mucositis.  He takes Percocet for chronic pain related to an orthopedic situation.  He may use Percocet for pain relief during his course of  radiation therapy.  This can be prescribed and monitored by Dr. Hardin Negus.  We discussed hypofractionated radiation therapy over 5-1/2 weeks versus standard fractionation over 6-1/2 weeks.  He prefers to take standard fractionation and receive 6600 cGy in 33 sessions.  Consent is signed today.  I will have him return in early July for his CT simulation.   PLAN: As discussed above.   I spent 45 minutes face to face with the patient and more than 50% of that time was spent in counseling and/or coordination of care.

## 2015-05-24 NOTE — Telephone Encounter (Signed)
Oncology Nurse Navigator Documentation  Oncology Nurse Navigator Flowsheets 05/24/2015  Referral date to RadOnc/MedOnc -  Navigator Encounter Type Telephone  Spoke with patient wife, confirmed understanding of appt time and location of [place].  Time Spent with Patient [place], RN, BSN, [place] at [place] [phone]

## 2015-05-25 DIAGNOSIS — R946 Abnormal results of thyroid function studies: Secondary | ICD-10-CM | POA: Diagnosis not present

## 2015-05-31 ENCOUNTER — Telehealth: Payer: Self-pay | Admitting: *Deleted

## 2015-05-31 NOTE — Telephone Encounter (Signed)
Oncology Nurse Navigator Documentation  Oncology Nurse Navigator Flowsheets 05/31/2015  Referral date to RadOnc/MedOnc -  Navigator Encounter Type Telephone  Patient Visit Type Follow-up  Called patient to inquire if he had questions after last week's appt with Dr. Valere Dross, had questions prior to next [facility] CT SIM.  LVM asking him to return my call.  Time Spent with Patient Berger, RN, BSN, [place] at [place] [phone]

## 2015-05-31 NOTE — Addendum Note (Signed)
Encounter addended by: Benn Moulder, RN on: 05/31/2015 11:16 AM<BR>     Documentation filed: Charges VN

## 2015-06-06 ENCOUNTER — Ambulatory Visit
Admission: RE | Admit: 2015-06-06 | Discharge: 2015-06-06 | Disposition: A | Discharge status: Home / Self Care | Payer: Medicare Other | Source: Ambulatory Visit | Attending: Radiation Oncology | Admitting: Radiation Oncology

## 2015-06-06 ENCOUNTER — Encounter: Payer: Self-pay | Admitting: *Deleted

## 2015-06-06 DIAGNOSIS — E039 Hypothyroidism, unspecified: Secondary | ICD-10-CM | POA: Diagnosis not present

## 2015-06-06 DIAGNOSIS — F419 Anxiety disorder, unspecified: Secondary | ICD-10-CM | POA: Diagnosis not present

## 2015-06-06 DIAGNOSIS — Z510 Encounter for antineoplastic radiation therapy: Secondary | ICD-10-CM | POA: Diagnosis not present

## 2015-06-06 DIAGNOSIS — C320 Malignant neoplasm of glottis: Secondary | ICD-10-CM | POA: Diagnosis not present

## 2015-06-06 DIAGNOSIS — K219 Gastro-esophageal reflux disease without esophagitis: Secondary | ICD-10-CM | POA: Diagnosis not present

## 2015-06-06 DIAGNOSIS — I10 Essential (primary) hypertension: Secondary | ICD-10-CM | POA: Diagnosis not present

## 2015-06-06 NOTE — Progress Notes (Signed)
Complex simulation/treatment planning note: The patient was taken to the CT simulator.  A custom head cast was constructed for immobilization.  A BB was placed 1 cm inferior to his thyroid notch at the level of the true vocal cords.  He was then scanned.  The CT data set was sent to the planning system where contoured his right true vocal cord.  He was set up to right and left lateral fields with one set of MLCs.  He is now ready for 3-D simulation.  I prescribing 6600 cGy in 33 sessions utilizing 6 MV photons.  Care will be taken to make sure he has good coverage of his anterior commissure.

## 2015-06-06 NOTE — Progress Notes (Signed)
Oncology Nurse Navigator Documentation  Oncology Nurse Navigator Flowsheets 06/06/2015  Referral date to RadOnc/MedOnc -  Navigator Encounter Type Other  Patient Visit Type Radonc  To provide support and encouragement, care continuity, met with patient after his CT SIM.  I showed him LINAC 2 tmt machine, explained arrival/preparation procedures for his first day of tmt.  He verbalized understanding of information provided, understanding he begins on 7/13 with port films, 7/14 with 1st tmt.   Time Spent with Patient [place], RN, BSN, [place] at [place] [phone]

## 2015-06-07 ENCOUNTER — Encounter: Payer: Self-pay | Admitting: Radiation Oncology

## 2015-06-07 DIAGNOSIS — C320 Malignant neoplasm of glottis: Secondary | ICD-10-CM | POA: Diagnosis not present

## 2015-06-07 DIAGNOSIS — K219 Gastro-esophageal reflux disease without esophagitis: Secondary | ICD-10-CM | POA: Diagnosis not present

## 2015-06-07 DIAGNOSIS — I10 Essential (primary) hypertension: Secondary | ICD-10-CM | POA: Diagnosis not present

## 2015-06-07 DIAGNOSIS — Z510 Encounter for antineoplastic radiation therapy: Secondary | ICD-10-CM | POA: Diagnosis not present

## 2015-06-07 DIAGNOSIS — F419 Anxiety disorder, unspecified: Secondary | ICD-10-CM | POA: Diagnosis not present

## 2015-06-07 DIAGNOSIS — E039 Hypothyroidism, unspecified: Secondary | ICD-10-CM | POA: Diagnosis not present

## 2015-06-07 NOTE — Progress Notes (Signed)
3-D simulation note: The patient completed 3-D simulation for treatment to his glottic larynx.  He was set up to right and left lateral fields with wedge compensation and a unique sets of MLCs for 2 complex treatment devices.  Dose volume histograms were obtained for the target, right glottis, and also avoidance structures including the pharynx and spinal cord.  We met our departmental guidelines.  I'm prescribing 6600 cGy in 33 sessions utilizing 6 MV photons.  Care was taken to make sure that there was adequate coverage along the anterior commissure.

## 2015-06-08 DIAGNOSIS — M150 Primary generalized (osteo)arthritis: Secondary | ICD-10-CM | POA: Diagnosis not present

## 2015-06-08 DIAGNOSIS — Z79891 Long term (current) use of opiate analgesic: Secondary | ICD-10-CM | POA: Diagnosis not present

## 2015-06-08 DIAGNOSIS — G894 Chronic pain syndrome: Secondary | ICD-10-CM | POA: Diagnosis not present

## 2015-06-11 DIAGNOSIS — K219 Gastro-esophageal reflux disease without esophagitis: Secondary | ICD-10-CM | POA: Diagnosis not present

## 2015-06-11 DIAGNOSIS — F419 Anxiety disorder, unspecified: Secondary | ICD-10-CM | POA: Diagnosis not present

## 2015-06-11 DIAGNOSIS — I10 Essential (primary) hypertension: Secondary | ICD-10-CM | POA: Diagnosis not present

## 2015-06-11 DIAGNOSIS — E039 Hypothyroidism, unspecified: Secondary | ICD-10-CM | POA: Diagnosis not present

## 2015-06-11 DIAGNOSIS — C320 Malignant neoplasm of glottis: Secondary | ICD-10-CM | POA: Diagnosis not present

## 2015-06-11 DIAGNOSIS — Z510 Encounter for antineoplastic radiation therapy: Secondary | ICD-10-CM | POA: Diagnosis not present

## 2015-06-13 ENCOUNTER — Ambulatory Visit
Admission: RE | Admit: 2015-06-13 | Discharge: 2015-06-13 | Disposition: A | Discharge status: Home / Self Care | Payer: Medicare Other | Source: Ambulatory Visit | Attending: Radiation Oncology | Admitting: Radiation Oncology

## 2015-06-13 ENCOUNTER — Encounter: Payer: Self-pay | Admitting: *Deleted

## 2015-06-13 DIAGNOSIS — E039 Hypothyroidism, unspecified: Secondary | ICD-10-CM | POA: Diagnosis not present

## 2015-06-13 DIAGNOSIS — Z510 Encounter for antineoplastic radiation therapy: Secondary | ICD-10-CM | POA: Diagnosis not present

## 2015-06-13 DIAGNOSIS — K219 Gastro-esophageal reflux disease without esophagitis: Secondary | ICD-10-CM | POA: Diagnosis not present

## 2015-06-13 DIAGNOSIS — F419 Anxiety disorder, unspecified: Secondary | ICD-10-CM | POA: Diagnosis not present

## 2015-06-13 DIAGNOSIS — I10 Essential (primary) hypertension: Secondary | ICD-10-CM | POA: Diagnosis not present

## 2015-06-13 DIAGNOSIS — C320 Malignant neoplasm of glottis: Secondary | ICD-10-CM | POA: Diagnosis not present

## 2015-06-14 ENCOUNTER — Ambulatory Visit
Admission: RE | Admit: 2015-06-14 | Discharge: 2015-06-14 | Disposition: A | Discharge status: Home / Self Care | Payer: Medicare Other | Source: Ambulatory Visit | Attending: Radiation Oncology | Admitting: Radiation Oncology

## 2015-06-14 DIAGNOSIS — I10 Essential (primary) hypertension: Secondary | ICD-10-CM | POA: Diagnosis not present

## 2015-06-14 DIAGNOSIS — C320 Malignant neoplasm of glottis: Secondary | ICD-10-CM | POA: Diagnosis not present

## 2015-06-14 DIAGNOSIS — F419 Anxiety disorder, unspecified: Secondary | ICD-10-CM | POA: Diagnosis not present

## 2015-06-14 DIAGNOSIS — Z510 Encounter for antineoplastic radiation therapy: Secondary | ICD-10-CM | POA: Diagnosis not present

## 2015-06-14 DIAGNOSIS — E039 Hypothyroidism, unspecified: Secondary | ICD-10-CM | POA: Diagnosis not present

## 2015-06-14 DIAGNOSIS — K219 Gastro-esophageal reflux disease without esophagitis: Secondary | ICD-10-CM | POA: Diagnosis not present

## 2015-06-15 ENCOUNTER — Ambulatory Visit
Admission: RE | Admit: 2015-06-15 | Discharge: 2015-06-15 | Disposition: A | Discharge status: Home / Self Care | Payer: Medicare Other | Source: Ambulatory Visit | Attending: Radiation Oncology | Admitting: Radiation Oncology

## 2015-06-15 DIAGNOSIS — I10 Essential (primary) hypertension: Secondary | ICD-10-CM | POA: Diagnosis not present

## 2015-06-15 DIAGNOSIS — E039 Hypothyroidism, unspecified: Secondary | ICD-10-CM | POA: Diagnosis not present

## 2015-06-15 DIAGNOSIS — C320 Malignant neoplasm of glottis: Secondary | ICD-10-CM | POA: Diagnosis not present

## 2015-06-15 DIAGNOSIS — Z510 Encounter for antineoplastic radiation therapy: Secondary | ICD-10-CM | POA: Diagnosis not present

## 2015-06-15 DIAGNOSIS — F419 Anxiety disorder, unspecified: Secondary | ICD-10-CM | POA: Diagnosis not present

## 2015-06-15 DIAGNOSIS — K219 Gastro-esophageal reflux disease without esophagitis: Secondary | ICD-10-CM | POA: Diagnosis not present

## 2015-06-15 NOTE — Progress Notes (Signed)
  Oncology Nurse Navigator Documentation   Navigator Encounter Type: Clinic/MDC (06/13/15 0800)      To provide support and encouragement, care continuity and to assess for needs, met with patient during [place]. He tolerated procedure wo/ difficulty. He did not express any needs or concerns at this time, I encouraged him to contact me if that changes, he verbalized understanding.     Gayleen Orem, RN, BSN, [place] at [place] [phone]

## 2015-06-18 ENCOUNTER — Ambulatory Visit
Admission: RE | Admit: 2015-06-18 | Discharge: 2015-06-18 | Disposition: A | Discharge status: Home / Self Care | Payer: Medicare Other | Source: Ambulatory Visit | Attending: Radiation Oncology | Admitting: Radiation Oncology

## 2015-06-18 VITALS — BP 139/79 | HR 65 | Temp 98.4°F | Resp 12 | Wt 197.8 lb

## 2015-06-18 DIAGNOSIS — I10 Essential (primary) hypertension: Secondary | ICD-10-CM | POA: Diagnosis not present

## 2015-06-18 DIAGNOSIS — C320 Malignant neoplasm of glottis: Secondary | ICD-10-CM

## 2015-06-18 DIAGNOSIS — F419 Anxiety disorder, unspecified: Secondary | ICD-10-CM | POA: Diagnosis not present

## 2015-06-18 DIAGNOSIS — K219 Gastro-esophageal reflux disease without esophagitis: Secondary | ICD-10-CM | POA: Diagnosis not present

## 2015-06-18 DIAGNOSIS — Z510 Encounter for antineoplastic radiation therapy: Secondary | ICD-10-CM | POA: Diagnosis not present

## 2015-06-18 DIAGNOSIS — E039 Hypothyroidism, unspecified: Secondary | ICD-10-CM | POA: Diagnosis not present

## 2015-06-18 NOTE — Progress Notes (Signed)
Weekly Management Note:  Site: Glottic larynx Current Dose:  600  cGy Projected Dose: 6600  cGy  Narrative: The patient is seen today for routine under treatment assessment. CBCT/MVCT images/port films were reviewed. The chart was reviewed.   He is without new complaints today.  He remains hoarse.  Physical Examination:  Filed Vitals:   06/18/15 1516  BP: 139/79  Pulse: 65  Temp: 98.4 F (36.9 C)  Resp: 12  .  Weight: 197 lb 12.8 oz (89.721 kg).  No change.  Impression: Tolerating radiation therapy well.  Plan: Continue radiation therapy as planned.

## 2015-06-18 NOTE — Progress Notes (Signed)

## 2015-06-18 NOTE — Progress Notes (Signed)
PAIN: He is currently in no pain.  SWALLOWING/DIET: Pt has had dysphagia for solids. Pt reports a regular unmodified diet orally. Oral exam reveals mucous membranes moist.  Pt reports they have a dry mouth almost always.   BOWEL: Pt reports Constipation. Constipation handout given.  SKIN: Skin exam reveals warm dry and intact OTHER: hoarse voice continues WEIGHT/VS: Wt Readings from Last 3 Encounters:  06/18/15 197 lb 12.8 oz (89.721 kg)  05/24/15 193 lb 3.2 oz (87.635 kg)  04/23/15 195 lb 12.8 oz (88.814 kg)   BP 139/79 mmHg  Pulse 65  Temp(Src) 98.4 F (36.9 C) (Oral)  Resp 12  Wt 197 lb 12.8 oz (89.721 kg)  SpO2 99%

## 2015-06-19 ENCOUNTER — Ambulatory Visit
Admission: RE | Admit: 2015-06-19 | Discharge: 2015-06-19 | Disposition: A | Discharge status: Home / Self Care | Payer: Medicare Other | Source: Ambulatory Visit | Attending: Radiation Oncology | Admitting: Radiation Oncology

## 2015-06-19 DIAGNOSIS — E039 Hypothyroidism, unspecified: Secondary | ICD-10-CM | POA: Diagnosis not present

## 2015-06-19 DIAGNOSIS — F419 Anxiety disorder, unspecified: Secondary | ICD-10-CM | POA: Diagnosis not present

## 2015-06-19 DIAGNOSIS — I10 Essential (primary) hypertension: Secondary | ICD-10-CM | POA: Diagnosis not present

## 2015-06-19 DIAGNOSIS — K219 Gastro-esophageal reflux disease without esophagitis: Secondary | ICD-10-CM | POA: Diagnosis not present

## 2015-06-19 DIAGNOSIS — Z510 Encounter for antineoplastic radiation therapy: Secondary | ICD-10-CM | POA: Diagnosis not present

## 2015-06-19 DIAGNOSIS — C320 Malignant neoplasm of glottis: Secondary | ICD-10-CM | POA: Diagnosis not present

## 2015-06-20 ENCOUNTER — Ambulatory Visit
Admission: RE | Admit: 2015-06-20 | Discharge: 2015-06-20 | Disposition: A | Discharge status: Home / Self Care | Payer: Medicare Other | Source: Ambulatory Visit | Attending: Radiation Oncology | Admitting: Radiation Oncology

## 2015-06-20 DIAGNOSIS — E039 Hypothyroidism, unspecified: Secondary | ICD-10-CM | POA: Diagnosis not present

## 2015-06-20 DIAGNOSIS — C320 Malignant neoplasm of glottis: Secondary | ICD-10-CM | POA: Diagnosis not present

## 2015-06-20 DIAGNOSIS — Z510 Encounter for antineoplastic radiation therapy: Secondary | ICD-10-CM | POA: Diagnosis not present

## 2015-06-20 DIAGNOSIS — K219 Gastro-esophageal reflux disease without esophagitis: Secondary | ICD-10-CM | POA: Diagnosis not present

## 2015-06-20 DIAGNOSIS — F419 Anxiety disorder, unspecified: Secondary | ICD-10-CM | POA: Diagnosis not present

## 2015-06-20 DIAGNOSIS — I10 Essential (primary) hypertension: Secondary | ICD-10-CM | POA: Diagnosis not present

## 2015-06-21 ENCOUNTER — Ambulatory Visit
Admission: RE | Admit: 2015-06-21 | Discharge: 2015-06-21 | Disposition: A | Discharge status: Home / Self Care | Payer: Medicare Other | Source: Ambulatory Visit | Attending: Radiation Oncology | Admitting: Radiation Oncology

## 2015-06-21 ENCOUNTER — Encounter: Payer: Self-pay | Admitting: *Deleted

## 2015-06-21 DIAGNOSIS — I10 Essential (primary) hypertension: Secondary | ICD-10-CM | POA: Diagnosis not present

## 2015-06-21 DIAGNOSIS — K219 Gastro-esophageal reflux disease without esophagitis: Secondary | ICD-10-CM | POA: Diagnosis not present

## 2015-06-21 DIAGNOSIS — C320 Malignant neoplasm of glottis: Secondary | ICD-10-CM | POA: Diagnosis not present

## 2015-06-21 DIAGNOSIS — F419 Anxiety disorder, unspecified: Secondary | ICD-10-CM | POA: Diagnosis not present

## 2015-06-21 DIAGNOSIS — E039 Hypothyroidism, unspecified: Secondary | ICD-10-CM | POA: Diagnosis not present

## 2015-06-21 DIAGNOSIS — Z510 Encounter for antineoplastic radiation therapy: Secondary | ICD-10-CM | POA: Diagnosis not present

## 2015-06-21 NOTE — Progress Notes (Signed)
  Oncology Nurse Navigator Documentation   Navigator Encounter Type: Treatment (06/21/15 1445)     To provide support and encouragement, care continuity and to assess for needs, met with patient prior to and after RT.  His wife accompanied him.  He did not express any needs or concerns at this time, I encouraged him to contact me if that changes, he verbalized understanding.   Gayleen Orem, RN, BSN, [place] at [place] [phone]

## 2015-06-22 ENCOUNTER — Ambulatory Visit
Admission: RE | Admit: 2015-06-22 | Discharge: 2015-06-22 | Disposition: A | Discharge status: Home / Self Care | Payer: Medicare Other | Source: Ambulatory Visit | Attending: Radiation Oncology | Admitting: Radiation Oncology

## 2015-06-22 DIAGNOSIS — E039 Hypothyroidism, unspecified: Secondary | ICD-10-CM | POA: Diagnosis not present

## 2015-06-22 DIAGNOSIS — C320 Malignant neoplasm of glottis: Secondary | ICD-10-CM | POA: Diagnosis not present

## 2015-06-22 DIAGNOSIS — K219 Gastro-esophageal reflux disease without esophagitis: Secondary | ICD-10-CM | POA: Diagnosis not present

## 2015-06-22 DIAGNOSIS — Z510 Encounter for antineoplastic radiation therapy: Secondary | ICD-10-CM | POA: Diagnosis not present

## 2015-06-22 DIAGNOSIS — I10 Essential (primary) hypertension: Secondary | ICD-10-CM | POA: Diagnosis not present

## 2015-06-22 DIAGNOSIS — F419 Anxiety disorder, unspecified: Secondary | ICD-10-CM | POA: Diagnosis not present

## 2015-06-25 ENCOUNTER — Ambulatory Visit
Admission: RE | Admit: 2015-06-25 | Discharge: 2015-06-25 | Disposition: A | Discharge status: Home / Self Care | Payer: Medicare Other | Source: Ambulatory Visit | Attending: Radiation Oncology | Admitting: Radiation Oncology

## 2015-06-25 ENCOUNTER — Encounter: Payer: Self-pay | Admitting: Radiation Oncology

## 2015-06-25 VITALS — BP 124/78 | HR 64 | Temp 98.3°F | Ht 75.0 in | Wt 196.9 lb

## 2015-06-25 DIAGNOSIS — K219 Gastro-esophageal reflux disease without esophagitis: Secondary | ICD-10-CM | POA: Diagnosis not present

## 2015-06-25 DIAGNOSIS — I10 Essential (primary) hypertension: Secondary | ICD-10-CM | POA: Diagnosis not present

## 2015-06-25 DIAGNOSIS — E039 Hypothyroidism, unspecified: Secondary | ICD-10-CM | POA: Diagnosis not present

## 2015-06-25 DIAGNOSIS — F419 Anxiety disorder, unspecified: Secondary | ICD-10-CM | POA: Diagnosis not present

## 2015-06-25 DIAGNOSIS — C320 Malignant neoplasm of glottis: Secondary | ICD-10-CM

## 2015-06-25 DIAGNOSIS — Z510 Encounter for antineoplastic radiation therapy: Secondary | ICD-10-CM | POA: Diagnosis not present

## 2015-06-25 MED ORDER — BIAFINE EX EMUL
Freq: Two times a day (BID) | CUTANEOUS | Status: DC
  Administered 2015-06-25: 14:00:00 via TOPICAL

## 2015-06-25 NOTE — Progress Notes (Signed)
Mr. Georg has received 8 fractions to his Glottis.  Has a level 2/10 sorethroat at this time.  Note mild redness of bilateral neck with intact skin.  Oral mucosa and back of throat moist and intact.    Denies fatigue.  Biafine given today.  BP 124/78 mmHg  Pulse 64  Temp(Src) 98.3 F (36.8 C)  Ht 6\' 3"  (1.905 m)  Wt 196 lb 14.4 oz (89.313 kg)  BMI 24.61 kg/m2   Wt Readings from Last 3 Encounters:  06/25/15 196 lb 14.4 oz (89.313 kg)  06/18/15 197 lb 12.8 oz (89.721 kg)  05/24/15 193 lb 3.2 oz (87.635 kg)

## 2015-06-25 NOTE — Progress Notes (Signed)
Weekly Management Note:  Site: larynx Current Dose:   1600  cGy Projected Dose:  6600  cGy  Narrative: The patient is seen today for routine under treatment assessment. CBCT/MVCT images/port films were reviewed. The chart was reviewed.    He is slightly more hoarse and does have slight throat discomfort , 2/10. He declines pain medication.  He was given Biafine cream to use as needed.  Physical Examination:  Filed Vitals:   06/25/15 1343  BP: 124/78  Pulse: 64  Temp: 98.3 F (36.8 C)  .  Weight: 196 lb 14.4 oz (89.313 kg).  There is slight erythema along the anterior neck.  No desquamation.  Impression: Tolerating radiation therapy well.  Plan: Continue radiation therapy as planned.

## 2015-06-26 ENCOUNTER — Ambulatory Visit
Admission: RE | Admit: 2015-06-26 | Discharge: 2015-06-26 | Disposition: A | Discharge status: Home / Self Care | Payer: Medicare Other | Source: Ambulatory Visit | Attending: Radiation Oncology | Admitting: Radiation Oncology

## 2015-06-26 DIAGNOSIS — I10 Essential (primary) hypertension: Secondary | ICD-10-CM | POA: Diagnosis not present

## 2015-06-26 DIAGNOSIS — K219 Gastro-esophageal reflux disease without esophagitis: Secondary | ICD-10-CM | POA: Diagnosis not present

## 2015-06-26 DIAGNOSIS — E039 Hypothyroidism, unspecified: Secondary | ICD-10-CM | POA: Diagnosis not present

## 2015-06-26 DIAGNOSIS — C320 Malignant neoplasm of glottis: Secondary | ICD-10-CM | POA: Diagnosis not present

## 2015-06-26 DIAGNOSIS — F419 Anxiety disorder, unspecified: Secondary | ICD-10-CM | POA: Diagnosis not present

## 2015-06-26 DIAGNOSIS — Z510 Encounter for antineoplastic radiation therapy: Secondary | ICD-10-CM | POA: Diagnosis not present

## 2015-06-27 ENCOUNTER — Ambulatory Visit
Admission: RE | Admit: 2015-06-27 | Discharge: 2015-06-27 | Disposition: A | Discharge status: Home / Self Care | Payer: Medicare Other | Source: Ambulatory Visit | Attending: Radiation Oncology | Admitting: Radiation Oncology

## 2015-06-27 DIAGNOSIS — C320 Malignant neoplasm of glottis: Secondary | ICD-10-CM | POA: Diagnosis not present

## 2015-06-27 DIAGNOSIS — E039 Hypothyroidism, unspecified: Secondary | ICD-10-CM | POA: Diagnosis not present

## 2015-06-27 DIAGNOSIS — F419 Anxiety disorder, unspecified: Secondary | ICD-10-CM | POA: Diagnosis not present

## 2015-06-27 DIAGNOSIS — K219 Gastro-esophageal reflux disease without esophagitis: Secondary | ICD-10-CM | POA: Diagnosis not present

## 2015-06-27 DIAGNOSIS — I10 Essential (primary) hypertension: Secondary | ICD-10-CM | POA: Diagnosis not present

## 2015-06-27 DIAGNOSIS — Z510 Encounter for antineoplastic radiation therapy: Secondary | ICD-10-CM | POA: Diagnosis not present

## 2015-06-28 ENCOUNTER — Ambulatory Visit
Admission: RE | Admit: 2015-06-28 | Discharge: 2015-06-28 | Disposition: A | Discharge status: Home / Self Care | Payer: Medicare Other | Source: Ambulatory Visit | Attending: Radiation Oncology | Admitting: Radiation Oncology

## 2015-06-28 DIAGNOSIS — E039 Hypothyroidism, unspecified: Secondary | ICD-10-CM | POA: Diagnosis not present

## 2015-06-28 DIAGNOSIS — F419 Anxiety disorder, unspecified: Secondary | ICD-10-CM | POA: Diagnosis not present

## 2015-06-28 DIAGNOSIS — Z510 Encounter for antineoplastic radiation therapy: Secondary | ICD-10-CM | POA: Diagnosis not present

## 2015-06-28 DIAGNOSIS — K219 Gastro-esophageal reflux disease without esophagitis: Secondary | ICD-10-CM | POA: Diagnosis not present

## 2015-06-28 DIAGNOSIS — I10 Essential (primary) hypertension: Secondary | ICD-10-CM | POA: Diagnosis not present

## 2015-06-28 DIAGNOSIS — C320 Malignant neoplasm of glottis: Secondary | ICD-10-CM | POA: Diagnosis not present

## 2015-06-29 ENCOUNTER — Ambulatory Visit
Admission: RE | Admit: 2015-06-29 | Discharge: 2015-06-29 | Disposition: A | Discharge status: Home / Self Care | Payer: Medicare Other | Source: Ambulatory Visit | Attending: Radiation Oncology | Admitting: Radiation Oncology

## 2015-06-29 DIAGNOSIS — F419 Anxiety disorder, unspecified: Secondary | ICD-10-CM | POA: Diagnosis not present

## 2015-06-29 DIAGNOSIS — Z510 Encounter for antineoplastic radiation therapy: Secondary | ICD-10-CM | POA: Diagnosis not present

## 2015-06-29 DIAGNOSIS — C320 Malignant neoplasm of glottis: Secondary | ICD-10-CM | POA: Diagnosis not present

## 2015-06-29 DIAGNOSIS — I10 Essential (primary) hypertension: Secondary | ICD-10-CM | POA: Diagnosis not present

## 2015-06-29 DIAGNOSIS — E039 Hypothyroidism, unspecified: Secondary | ICD-10-CM | POA: Diagnosis not present

## 2015-06-29 DIAGNOSIS — K219 Gastro-esophageal reflux disease without esophagitis: Secondary | ICD-10-CM | POA: Diagnosis not present

## 2015-07-02 ENCOUNTER — Ambulatory Visit
Admission: RE | Admit: 2015-07-02 | Discharge: 2015-07-02 | Disposition: A | Discharge status: Home / Self Care | Payer: Medicare Other | Source: Ambulatory Visit | Attending: Radiation Oncology | Admitting: Radiation Oncology

## 2015-07-02 DIAGNOSIS — I10 Essential (primary) hypertension: Secondary | ICD-10-CM | POA: Diagnosis not present

## 2015-07-02 DIAGNOSIS — C320 Malignant neoplasm of glottis: Secondary | ICD-10-CM | POA: Diagnosis not present

## 2015-07-02 DIAGNOSIS — F419 Anxiety disorder, unspecified: Secondary | ICD-10-CM | POA: Diagnosis not present

## 2015-07-02 DIAGNOSIS — K219 Gastro-esophageal reflux disease without esophagitis: Secondary | ICD-10-CM | POA: Diagnosis not present

## 2015-07-02 DIAGNOSIS — Z510 Encounter for antineoplastic radiation therapy: Secondary | ICD-10-CM | POA: Diagnosis not present

## 2015-07-02 DIAGNOSIS — E039 Hypothyroidism, unspecified: Secondary | ICD-10-CM | POA: Diagnosis not present

## 2015-07-03 ENCOUNTER — Ambulatory Visit
Admission: RE | Admit: 2015-07-03 | Discharge: 2015-07-03 | Disposition: A | Discharge status: Home / Self Care | Payer: Medicare Other | Source: Ambulatory Visit | Attending: Radiation Oncology | Admitting: Radiation Oncology

## 2015-07-03 ENCOUNTER — Encounter: Payer: Self-pay | Admitting: Radiation Oncology

## 2015-07-03 VITALS — BP 136/80 | HR 59 | Temp 98.3°F | Ht 75.0 in | Wt 192.7 lb

## 2015-07-03 DIAGNOSIS — C320 Malignant neoplasm of glottis: Secondary | ICD-10-CM | POA: Diagnosis not present

## 2015-07-03 DIAGNOSIS — K219 Gastro-esophageal reflux disease without esophagitis: Secondary | ICD-10-CM | POA: Diagnosis not present

## 2015-07-03 DIAGNOSIS — F419 Anxiety disorder, unspecified: Secondary | ICD-10-CM | POA: Diagnosis not present

## 2015-07-03 DIAGNOSIS — Z510 Encounter for antineoplastic radiation therapy: Secondary | ICD-10-CM | POA: Diagnosis not present

## 2015-07-03 DIAGNOSIS — I10 Essential (primary) hypertension: Secondary | ICD-10-CM | POA: Diagnosis not present

## 2015-07-03 DIAGNOSIS — E039 Hypothyroidism, unspecified: Secondary | ICD-10-CM | POA: Diagnosis not present

## 2015-07-03 NOTE — Progress Notes (Signed)
CC: [name]   Weekly Management Note:  Site: Larynx Current Dose:  2800  cGy Projected Dose: 6600  cGy  Narrative: The patient is seen today for routine under treatment assessment. CBCT/MVCT images/port films were reviewed. The chart was reviewed.   He is having more throat discomfort from radiation mucositis.  He has moderate to marked pain on swallowing.  He is now eating mostly soft foods.  His weight is down 4 pounds over the past week.  Dr. Hardin [name] told he could take up to 10 oxycodone/APAP (7.5/325) tabs a day if necessary.  He had been on oxycodone/APAP 5/325 for chronic pain.  Physical Examination:  Filed Vitals:   07/03/15 1109  BP: 136/80  Pulse: 59  Temp: 98.3 F (36.8 C)  .  Weight: 192 lb 11.2 oz (87.408 kg).  There is no adenopathy in the neck.  Oral cavity and oropharynx are unremarkable to inspection.  Indirect mirror examination not performed today.  Impression: Tolerating radiation therapy well, except for early mucositis resulting in odynophagia.  I told [name] that he may need to increase his oxycodone for better pain control so that he can maintain his nutrition/caloric intake.  I defer to Dr. Hardin [name] regarding his pain medication regimen.  I wonder if he would benefit from a long-acting pain medication (either OxyContin or a fentanyl patch) during his radiation therapy.  Plan: Continue radiation therapy as planned.

## 2015-07-03 NOTE — Progress Notes (Addendum)
Mr. Lobos has received 14 fractions to his Glottis.  He reports that he has increased difficulty swallowing and eating mostly soft foods. Skin intact in tx field. BP 136/80 mmHg  Pulse 59  Temp(Src) 98.3 F (36.8 C)  Ht 6\' 3"  (1.905 m)  Wt 192 lb 11.2 oz (87.408 kg)  BMI 24.09 kg/m2   Has lost 5 lbs since 06/18/15.  Will schedule an appt. With Ernestene Kiel, Dietician.  Encouraging adding cheese to foods, chopped meat with gravy/soup base, and milkshakes.    Wt Readings from Last 3 Encounters:  07/03/15 192 lb 11.2 oz (87.408 kg)  06/25/15 196 lb 14.4 oz (89.313 kg)  06/18/15 197 lb 12.8 oz (89.721 kg)

## 2015-07-04 ENCOUNTER — Ambulatory Visit
Admission: RE | Admit: 2015-07-04 | Discharge: 2015-07-04 | Disposition: A | Discharge status: Home / Self Care | Payer: Medicare Other | Source: Ambulatory Visit | Attending: Radiation Oncology | Admitting: Radiation Oncology

## 2015-07-04 DIAGNOSIS — I10 Essential (primary) hypertension: Secondary | ICD-10-CM | POA: Diagnosis not present

## 2015-07-04 DIAGNOSIS — K219 Gastro-esophageal reflux disease without esophagitis: Secondary | ICD-10-CM | POA: Diagnosis not present

## 2015-07-04 DIAGNOSIS — C320 Malignant neoplasm of glottis: Secondary | ICD-10-CM | POA: Diagnosis not present

## 2015-07-04 DIAGNOSIS — F419 Anxiety disorder, unspecified: Secondary | ICD-10-CM | POA: Diagnosis not present

## 2015-07-04 DIAGNOSIS — E039 Hypothyroidism, unspecified: Secondary | ICD-10-CM | POA: Diagnosis not present

## 2015-07-04 DIAGNOSIS — Z510 Encounter for antineoplastic radiation therapy: Secondary | ICD-10-CM | POA: Diagnosis not present

## 2015-07-05 ENCOUNTER — Ambulatory Visit
Admission: RE | Admit: 2015-07-05 | Discharge: 2015-07-05 | Disposition: A | Discharge status: Home / Self Care | Payer: Medicare Other | Source: Ambulatory Visit | Attending: Radiation Oncology | Admitting: Radiation Oncology

## 2015-07-05 DIAGNOSIS — K219 Gastro-esophageal reflux disease without esophagitis: Secondary | ICD-10-CM | POA: Diagnosis not present

## 2015-07-05 DIAGNOSIS — I10 Essential (primary) hypertension: Secondary | ICD-10-CM | POA: Diagnosis not present

## 2015-07-05 DIAGNOSIS — F419 Anxiety disorder, unspecified: Secondary | ICD-10-CM | POA: Diagnosis not present

## 2015-07-05 DIAGNOSIS — C320 Malignant neoplasm of glottis: Secondary | ICD-10-CM | POA: Diagnosis not present

## 2015-07-05 DIAGNOSIS — E039 Hypothyroidism, unspecified: Secondary | ICD-10-CM | POA: Diagnosis not present

## 2015-07-05 DIAGNOSIS — Z510 Encounter for antineoplastic radiation therapy: Secondary | ICD-10-CM | POA: Diagnosis not present

## 2015-07-06 ENCOUNTER — Telehealth: Payer: Self-pay | Admitting: Critical Care Medicine

## 2015-07-06 ENCOUNTER — Ambulatory Visit
Admission: RE | Admit: 2015-07-06 | Discharge: 2015-07-06 | Disposition: A | Discharge status: Home / Self Care | Payer: Medicare Other | Source: Ambulatory Visit | Attending: Radiation Oncology | Admitting: Radiation Oncology

## 2015-07-06 DIAGNOSIS — I10 Essential (primary) hypertension: Secondary | ICD-10-CM | POA: Diagnosis not present

## 2015-07-06 DIAGNOSIS — Z510 Encounter for antineoplastic radiation therapy: Secondary | ICD-10-CM | POA: Diagnosis not present

## 2015-07-06 DIAGNOSIS — C320 Malignant neoplasm of glottis: Secondary | ICD-10-CM | POA: Diagnosis not present

## 2015-07-06 DIAGNOSIS — K219 Gastro-esophageal reflux disease without esophagitis: Secondary | ICD-10-CM | POA: Diagnosis not present

## 2015-07-06 DIAGNOSIS — F419 Anxiety disorder, unspecified: Secondary | ICD-10-CM | POA: Diagnosis not present

## 2015-07-06 DIAGNOSIS — E039 Hypothyroidism, unspecified: Secondary | ICD-10-CM | POA: Diagnosis not present

## 2015-07-06 NOTE — Telephone Encounter (Signed)
Spoke with pt's wife. She adamant that the pt see PW before he leaves. Advised her that he does not have any availability and that I would get this message to CJ to see where we can work pt in.  Crystal please advise. Thanks.

## 2015-07-09 ENCOUNTER — Ambulatory Visit
Admission: RE | Admit: 2015-07-09 | Discharge: 2015-07-09 | Disposition: A | Discharge status: Home / Self Care | Payer: Medicare Other | Source: Ambulatory Visit | Attending: Radiation Oncology | Admitting: Radiation Oncology

## 2015-07-09 VITALS — BP 137/93 | HR 62 | Temp 98.5°F | Resp 12 | Wt 194.2 lb

## 2015-07-09 DIAGNOSIS — I10 Essential (primary) hypertension: Secondary | ICD-10-CM | POA: Diagnosis not present

## 2015-07-09 DIAGNOSIS — C320 Malignant neoplasm of glottis: Secondary | ICD-10-CM

## 2015-07-09 DIAGNOSIS — F419 Anxiety disorder, unspecified: Secondary | ICD-10-CM | POA: Diagnosis not present

## 2015-07-09 DIAGNOSIS — E039 Hypothyroidism, unspecified: Secondary | ICD-10-CM | POA: Diagnosis not present

## 2015-07-09 DIAGNOSIS — Z510 Encounter for antineoplastic radiation therapy: Secondary | ICD-10-CM | POA: Diagnosis not present

## 2015-07-09 DIAGNOSIS — K219 Gastro-esophageal reflux disease without esophagitis: Secondary | ICD-10-CM | POA: Diagnosis not present

## 2015-07-09 MED ORDER — SUCRALFATE 1 G PO TABS
ORAL_TABLET | ORAL | Status: DC
Start: 2015-07-09 — End: 2015-07-30

## 2015-07-09 MED ORDER — LIDOCAINE VISCOUS 2 % MT SOLN: OROMUCOSAL | Status: DC

## 2015-07-09 NOTE — Progress Notes (Signed)
PAIN: He rates his pain as a 7 on a scale of 0-10. intermittent and sharp over throat when swallowing  SWALLOWING/DIET: Pt denies dysphagia. Pt reports a soft- regular unmodified diet orally. Oral exam reveals mild erythema, mucous membranes moist but pt reports occasional dry mouth. BOWEL: Pt reports a soft bowel movement every day.  SKIN: Skin exam reveals Dryness, Pruritus and erythema. Pt continues to apply Biafine as directed. OTHER: Pt complains of fatigue.   WEIGHT/VS: Wt Readings from Last 3 Encounters:  07/09/15 194 lb 3.2 oz (88.089 kg)  07/03/15 192 lb 11.2 oz (87.408 kg)  06/25/15 196 lb 14.4 oz (89.313 kg)   BP 137/93 mmHg  Pulse 62  Temp(Src) 98.5 F (36.9 C) (Oral)  Resp 12  Wt 194 lb 3.2 oz (88.089 kg)  SpO2 100% Orthostatic Standing Vital Signs: BP:123/90  P:66  Pox:100% room air.

## 2015-07-09 NOTE — Telephone Encounter (Signed)
Spoke with pt's wife.  States this is for a 6 month follow up only.  Requesting appt in Sept at [facility].  Scheduled for Sept 7 at 8:45 am.  Wife confirmed appt and voiced no further questions or concerns at this time.

## 2015-07-09 NOTE — Progress Notes (Signed)
Managing Acute Radiation Side Effects for Head and Neck Cancer  Skin irritation:  . Biafine  Topical Emulsion: First-line topical cream to help soothe skin irritation.  Apply to skin in radiation fields at least 4 hours before radiotherapy, or any time after treatments during the rest of the day.  . Triple Antibiotic Ointment (Neosporin): Apply to areas of skin with moist breakdown to prevent infection.  . 1% hydrocortisone cream: Apply to areas of skin that are itching, up to three times a day.  . Silvadene (Silver Sulfadiazine): Used in select cases if large patches of skin develop moist breakdown (let physician or nurse know if you have a "sulfa" drug allergy)  Soreness in mouth or throat: . Baking Soda Rinse: a home remedy to soothe/cleanse mouth and loosen thick saliva.  Mix 1/2 teaspoon salt, 1/2 teaspoon baking soda, 1 pint water (16 oz or two cups).  Swish, gargle and spit as needed to soothe/cleanse mouth. Use as often as you want.  . Sucralfate (Carafate): coats throat to soothe it before meals or any time of day. Crush 1 tablet in 10 mL H20 and swallow up to four times a day.  . 2% viscous Lidocaine (Magic Mouth Wash): Soothes mouth and/or throat by numbing your mucous membranes. Mix 1 part 2% viscous lidocaine (Magic Mouth Wash), 1 part H20. Swish and/or swallow 10mL of this mixture, 30min before meals and at bedtime, up to four times a day. Alternate with Sucralfate (Carafate).  . Narcotics: Various short acting and long acting narcotics can be prescribed.  Often, medical oncology will prescribe these if you are receiving chemotherapy concurrently. Narcotics may cause constipation. It may be helpful to take a stool softener (Docusate Sodium) or gentle laxative (ie Senna or Polyethylene Glycol) to prevent constipation.  Having food in your stomach before ingesting a narcotic may reduce risk of stomach upset.  Thick Saliva: . Baking Soda Rinse: a home remedy to soothe/cleanse mouth  and loosen thick saliva.  Mix 1/2 teaspoon salt, 1/2 teaspoon baking soda, 1 pint water (16 oz or two cups).  Swish, gargle, and spit as needed to soothe/cleanse mouth. Use as often as you want.  . Some patients find Diet Ginger Ale or Papaya Juice to be helpful.  . In extreme cases, your physician may consider prescribing a Scopolamine transdermal patch which dries up your saliva.     Poor taste, or lack of taste:   . There are no well-established medications to combat taste bud changes from radiotherapy.  It often takes weeks to months to regain taste function.  Eating bland foods and drinking nutritional shakes  may help you maintain your weight when food is not enjoyable.  Some patients supplement their oral intake with a feeding tube.  Fatigue and weakness: . There is not a well-established safe and effective medication to combat radiation-induced fatigue.  However, if you are able to perform light exercise (such as a daily walk, yoga, recumbent stationary bicycling), this may combat fatigue and help you maintain muscle mass during treatment.  . Maintaining hydration and nutrition are also important.  If you have not been referred to a nutritionist and would like a referral, please let your nurse or physician know.  . Try to get at least 8 hours of sleep each night. You may need a daily nap, but try not to nap so late that it interferes with your nightly sleep schedule. 

## 2015-07-09 NOTE — Progress Notes (Signed)
   Weekly Management Note:  outpatient    ICD-9-CM ICD-10-CM   1. Squamous cell carcinoma of right vocal cord 161.0 C32.0     Current Dose:  36 Gy  Projected Dose: 66 Gy   Narrative:  The patient presents for routine under treatment assessment.  CBCT/MVCT images/Port film x-rays were reviewed.  The chart was checked. Doing well. But, more throat pain.  Physical Findings:  Wt Readings from Last 3 Encounters:  07/09/15 194 lb 3.2 oz (88.089 kg)  07/03/15 192 lb 11.2 oz (87.408 kg)  06/25/15 196 lb 14.4 oz (89.313 kg)    weight is 194 lb 3.2 oz (88.089 kg). His oral temperature is 98.5 F (36.9 C). His blood pressure is 137/93 and his pulse is 62. His respiration is 12 and oxygen saturation is 100%.  oropharynx clear. Skin erythema over anterior neck   CBC    Component Value Date/Time   WBC 17.7* 07/17/2013 0245   RBC 4.55 07/17/2013 0245   HGB 15.6 04/23/2015 0734   HCT 46.0 04/23/2015 0734   PLT 261 07/17/2013 0245   MCV 88.4 07/17/2013 0245   MCH 29.7 07/17/2013 0245   MCHC 33.6 07/17/2013 0245   RDW 14.2 07/17/2013 0245   LYMPHSABS 1.0 07/17/2013 0245   MONOABS 2.2* 07/17/2013 0245   EOSABS 0.4 07/17/2013 0245   BASOSABS 0.0 07/17/2013 0245     CMP     Component Value Date/Time   NA 139 04/23/2015 0734   K 4.3 04/23/2015 0734   CL 101 04/23/2015 0734   CO2 26 07/17/2013 0245   GLUCOSE 94 04/23/2015 0734   BUN 15 04/23/2015 0734   CREATININE 0.90 04/23/2015 0734   CALCIUM 10.8* 07/17/2013 0245   PROT 7.9 07/17/2013 0245   ALBUMIN 4.5 07/17/2013 0245   AST 26 07/17/2013 0245   ALT 17 07/17/2013 0245   ALKPHOS 33* 07/17/2013 0245   BILITOT 0.4 07/17/2013 0245   GFRNONAA 84* 07/17/2013 0245   GFRAA >90 07/17/2013 0245     Impression:  The patient is tolerating radiotherapy.   Plan:  Continue radiotherapy as planned. Rx carafate and lidocaine for throat pain as above  -----------------------------------  Eppie Gibson, MD

## 2015-07-09 NOTE — Telephone Encounter (Signed)
Pt returned call Pt would like to be seen in [place] [phone]

## 2015-07-09 NOTE — Telephone Encounter (Signed)
lmtcb for pt's wife.  Will need to find out what location [facility] [place], [place]) pt is requesting.

## 2015-07-09 NOTE — Addendum Note (Signed)
Encounter addended by: Jenene Slicker, RN on: 07/09/2015  5:03 PM<BR>     Documentation filed: Inpatient Patient Education

## 2015-07-10 ENCOUNTER — Ambulatory Visit: Payer: Medicare Other | Admitting: Nutrition

## 2015-07-10 ENCOUNTER — Ambulatory Visit
Admission: RE | Admit: 2015-07-10 | Discharge: 2015-07-10 | Disposition: A | Discharge status: Home / Self Care | Payer: Medicare Other | Source: Ambulatory Visit | Attending: Radiation Oncology | Admitting: Radiation Oncology

## 2015-07-10 DIAGNOSIS — E039 Hypothyroidism, unspecified: Secondary | ICD-10-CM | POA: Diagnosis not present

## 2015-07-10 DIAGNOSIS — Z510 Encounter for antineoplastic radiation therapy: Secondary | ICD-10-CM | POA: Diagnosis not present

## 2015-07-10 DIAGNOSIS — K219 Gastro-esophageal reflux disease without esophagitis: Secondary | ICD-10-CM | POA: Diagnosis not present

## 2015-07-10 DIAGNOSIS — I10 Essential (primary) hypertension: Secondary | ICD-10-CM | POA: Diagnosis not present

## 2015-07-10 DIAGNOSIS — F419 Anxiety disorder, unspecified: Secondary | ICD-10-CM | POA: Diagnosis not present

## 2015-07-10 DIAGNOSIS — C320 Malignant neoplasm of glottis: Secondary | ICD-10-CM | POA: Diagnosis not present

## 2015-07-10 NOTE — Progress Notes (Signed)
69 year old male diagnosed with vocal cord cancer.  Past medical history includes anxiety, depressive disorder, hypothyroidism, osteoporosis, hypertension, GERD, reflux gastritis, chewing tobacco.  Medications include Cymbalta, Synthroid, and Protonix.  Labs include glucose of 159.  Height: 6 feet 3 inches. Weight: 194.2 pounds August 8. Usual body weight: 199.6 pounds March 2016. BMI: 24.27.  Patient denies difficulty eating at this time. He typically eats 3 meals daily but does not snack. He has completed 19 out of 33 radiation therapy treatments. Patient does endorse approximate 5 pound weight loss.  Nutrition diagnosis: Predicted suboptimal energy intake related to diagnosis of vocal cord cancer and associated treatments as evidenced by history or presence of her condition for which research shows an increased incidence of suboptimal energy intake.  Intervention:  Patient was educated to continue 3 meals daily and add snacks between meals. Reviewed and educated patient on the importance of increasing protein containing foods.  Patient was provided with a fact sheet on increasing calories and protein. Recommended patient could consume oral nutrition supplements for added calories and protein.  Samples were provided. Educated patient on strategies for difficulty swallowing and provided a fact sheet. Questions were answered.  Teach back method used.  Contact information was given.  Monitoring, evaluation, goals: Patient will continue to tolerate adequate calories and protein for minimal weight loss.  Next visit: Patient prefers to contact me for follow-up if needed.  **Disclaimer: This note was dictated with voice recognition software. Similar sounding words can inadvertently be transcribed and this note may contain transcription errors which may not have been corrected upon publication of note.**

## 2015-07-11 ENCOUNTER — Ambulatory Visit
Admission: RE | Admit: 2015-07-11 | Discharge: 2015-07-11 | Disposition: A | Discharge status: Home / Self Care | Payer: Medicare Other | Source: Ambulatory Visit | Attending: Radiation Oncology | Admitting: Radiation Oncology

## 2015-07-11 DIAGNOSIS — I10 Essential (primary) hypertension: Secondary | ICD-10-CM | POA: Diagnosis not present

## 2015-07-11 DIAGNOSIS — C320 Malignant neoplasm of glottis: Secondary | ICD-10-CM | POA: Diagnosis not present

## 2015-07-11 DIAGNOSIS — Z510 Encounter for antineoplastic radiation therapy: Secondary | ICD-10-CM | POA: Diagnosis not present

## 2015-07-11 DIAGNOSIS — E039 Hypothyroidism, unspecified: Secondary | ICD-10-CM | POA: Diagnosis not present

## 2015-07-11 DIAGNOSIS — F419 Anxiety disorder, unspecified: Secondary | ICD-10-CM | POA: Diagnosis not present

## 2015-07-11 DIAGNOSIS — K219 Gastro-esophageal reflux disease without esophagitis: Secondary | ICD-10-CM | POA: Diagnosis not present

## 2015-07-12 ENCOUNTER — Ambulatory Visit
Admission: RE | Admit: 2015-07-12 | Discharge: 2015-07-12 | Disposition: A | Discharge status: Home / Self Care | Payer: Medicare Other | Source: Ambulatory Visit | Attending: Radiation Oncology | Admitting: Radiation Oncology

## 2015-07-12 DIAGNOSIS — F419 Anxiety disorder, unspecified: Secondary | ICD-10-CM | POA: Diagnosis not present

## 2015-07-12 DIAGNOSIS — K219 Gastro-esophageal reflux disease without esophagitis: Secondary | ICD-10-CM | POA: Diagnosis not present

## 2015-07-12 DIAGNOSIS — E039 Hypothyroidism, unspecified: Secondary | ICD-10-CM | POA: Diagnosis not present

## 2015-07-12 DIAGNOSIS — I10 Essential (primary) hypertension: Secondary | ICD-10-CM | POA: Diagnosis not present

## 2015-07-12 DIAGNOSIS — C320 Malignant neoplasm of glottis: Secondary | ICD-10-CM | POA: Diagnosis not present

## 2015-07-12 DIAGNOSIS — Z510 Encounter for antineoplastic radiation therapy: Secondary | ICD-10-CM | POA: Diagnosis not present

## 2015-07-13 ENCOUNTER — Ambulatory Visit
Admission: RE | Admit: 2015-07-13 | Discharge: 2015-07-13 | Disposition: A | Discharge status: Home / Self Care | Payer: Medicare Other | Source: Ambulatory Visit | Attending: Radiation Oncology | Admitting: Radiation Oncology

## 2015-07-13 DIAGNOSIS — C320 Malignant neoplasm of glottis: Secondary | ICD-10-CM | POA: Diagnosis not present

## 2015-07-13 DIAGNOSIS — F419 Anxiety disorder, unspecified: Secondary | ICD-10-CM | POA: Diagnosis not present

## 2015-07-13 DIAGNOSIS — E039 Hypothyroidism, unspecified: Secondary | ICD-10-CM | POA: Diagnosis not present

## 2015-07-13 DIAGNOSIS — I10 Essential (primary) hypertension: Secondary | ICD-10-CM | POA: Diagnosis not present

## 2015-07-13 DIAGNOSIS — K219 Gastro-esophageal reflux disease without esophagitis: Secondary | ICD-10-CM | POA: Diagnosis not present

## 2015-07-13 DIAGNOSIS — Z510 Encounter for antineoplastic radiation therapy: Secondary | ICD-10-CM | POA: Diagnosis not present

## 2015-07-16 ENCOUNTER — Encounter: Payer: Self-pay | Admitting: Radiation Oncology

## 2015-07-16 ENCOUNTER — Ambulatory Visit
Admission: RE | Admit: 2015-07-16 | Discharge: 2015-07-16 | Disposition: A | Discharge status: Home / Self Care | Payer: Medicare Other | Source: Ambulatory Visit | Attending: Radiation Oncology | Admitting: Radiation Oncology

## 2015-07-16 VITALS — BP 145/86 | HR 80 | Temp 97.9°F | Ht 75.0 in | Wt 192.9 lb

## 2015-07-16 DIAGNOSIS — C320 Malignant neoplasm of glottis: Secondary | ICD-10-CM

## 2015-07-16 DIAGNOSIS — E039 Hypothyroidism, unspecified: Secondary | ICD-10-CM | POA: Diagnosis not present

## 2015-07-16 DIAGNOSIS — Z510 Encounter for antineoplastic radiation therapy: Secondary | ICD-10-CM | POA: Diagnosis not present

## 2015-07-16 DIAGNOSIS — K219 Gastro-esophageal reflux disease without esophagitis: Secondary | ICD-10-CM | POA: Diagnosis not present

## 2015-07-16 DIAGNOSIS — I10 Essential (primary) hypertension: Secondary | ICD-10-CM | POA: Diagnosis not present

## 2015-07-16 DIAGNOSIS — F419 Anxiety disorder, unspecified: Secondary | ICD-10-CM | POA: Diagnosis not present

## 2015-07-16 NOTE — Progress Notes (Signed)
Weekly Management Note:  Site: Glottic larynx Current Dose:  4600  cGy Projected Dose: 6600  cGy  Narrative: The patient is seen today for routine under treatment assessment. CBCT/MVCT images/port films were reviewed. The chart was reviewed.   He continues to have significant pain on swallowing.  Dr. Hardin Negus told him that he could take 8-10 oxycodone/APAP (7.5/325) tabs a day.  He has not seen Dr. Hardin Negus about the possibility of using a fentanyl patch for OxyContin to supplement his pain regimen.  He was given viscous Xylocaine and Carafate by Dr. Isidore Moos last week.  This was of minimal benefit.  His weight remains stable.  His hoarseness is somewhat worse.  Physical Examination:  Filed Vitals:   07/16/15 1100  BP: 145/86  Pulse: 80  Temp: 97.9 F (36.6 C)  .  Weight: 192 lb 14.4 oz (87.499 kg).  There is erythema along the anterior neck with alopecia.  Oral cavity and oropharynx unremarkable to inspection.  Impression: Tolerating radiation therapy well, although pain poorly controlled at this time.  I defer to Dr. Hardin Negus regarding his pain medications.  Plan: Continue radiation therapy as planned.

## 2015-07-16 NOTE — Progress Notes (Signed)
Mr. Vasek has received 23 fractions to his Glottis.  He voice has a raspy quality at this time.  He reports level 8/10 pain upon swallowing. Note redness/dr of tx field, but skin remains intact BP 145/86 mmHg  Pulse 80  Temp(Src) 97.9 F (36.6 C)  Ht 6\' 3"  (1.905 m)  Wt 192 lb 14.4 oz (87.499 kg)  BMI 24.11 kg/m2  Wt Readings from Last 3 Encounters:  07/16/15 192 lb 14.4 oz (87.499 kg)  07/09/15 194 lb 3.2 oz (88.089 kg)  07/03/15 192 lb 11.2 oz (87.408 kg)

## 2015-07-17 ENCOUNTER — Ambulatory Visit
Admission: RE | Admit: 2015-07-17 | Discharge: 2015-07-17 | Disposition: A | Discharge status: Home / Self Care | Payer: Medicare Other | Source: Ambulatory Visit | Attending: Radiation Oncology | Admitting: Radiation Oncology

## 2015-07-17 DIAGNOSIS — Z510 Encounter for antineoplastic radiation therapy: Secondary | ICD-10-CM | POA: Diagnosis not present

## 2015-07-17 DIAGNOSIS — C320 Malignant neoplasm of glottis: Secondary | ICD-10-CM | POA: Diagnosis not present

## 2015-07-17 DIAGNOSIS — F419 Anxiety disorder, unspecified: Secondary | ICD-10-CM | POA: Diagnosis not present

## 2015-07-17 DIAGNOSIS — K219 Gastro-esophageal reflux disease without esophagitis: Secondary | ICD-10-CM | POA: Diagnosis not present

## 2015-07-17 DIAGNOSIS — E039 Hypothyroidism, unspecified: Secondary | ICD-10-CM | POA: Diagnosis not present

## 2015-07-17 DIAGNOSIS — I10 Essential (primary) hypertension: Secondary | ICD-10-CM | POA: Diagnosis not present

## 2015-07-18 ENCOUNTER — Ambulatory Visit
Admission: RE | Admit: 2015-07-18 | Discharge: 2015-07-18 | Disposition: A | Discharge status: Home / Self Care | Payer: Medicare Other | Source: Ambulatory Visit | Attending: Radiation Oncology | Admitting: Radiation Oncology

## 2015-07-18 DIAGNOSIS — Z510 Encounter for antineoplastic radiation therapy: Secondary | ICD-10-CM | POA: Diagnosis not present

## 2015-07-18 DIAGNOSIS — F419 Anxiety disorder, unspecified: Secondary | ICD-10-CM | POA: Diagnosis not present

## 2015-07-18 DIAGNOSIS — C320 Malignant neoplasm of glottis: Secondary | ICD-10-CM | POA: Diagnosis not present

## 2015-07-18 DIAGNOSIS — K219 Gastro-esophageal reflux disease without esophagitis: Secondary | ICD-10-CM | POA: Diagnosis not present

## 2015-07-18 DIAGNOSIS — I10 Essential (primary) hypertension: Secondary | ICD-10-CM | POA: Diagnosis not present

## 2015-07-18 DIAGNOSIS — E039 Hypothyroidism, unspecified: Secondary | ICD-10-CM | POA: Diagnosis not present

## 2015-07-19 ENCOUNTER — Encounter: Payer: Self-pay | Admitting: *Deleted

## 2015-07-19 ENCOUNTER — Ambulatory Visit
Admission: RE | Admit: 2015-07-19 | Discharge: 2015-07-19 | Disposition: A | Discharge status: Home / Self Care | Payer: Medicare Other | Source: Ambulatory Visit | Attending: Radiation Oncology | Admitting: Radiation Oncology

## 2015-07-19 DIAGNOSIS — I10 Essential (primary) hypertension: Secondary | ICD-10-CM | POA: Diagnosis not present

## 2015-07-19 DIAGNOSIS — Z510 Encounter for antineoplastic radiation therapy: Secondary | ICD-10-CM | POA: Diagnosis not present

## 2015-07-19 DIAGNOSIS — E039 Hypothyroidism, unspecified: Secondary | ICD-10-CM | POA: Diagnosis not present

## 2015-07-19 DIAGNOSIS — F419 Anxiety disorder, unspecified: Secondary | ICD-10-CM | POA: Diagnosis not present

## 2015-07-19 DIAGNOSIS — C320 Malignant neoplasm of glottis: Secondary | ICD-10-CM | POA: Diagnosis not present

## 2015-07-19 DIAGNOSIS — K219 Gastro-esophageal reflux disease without esophagitis: Secondary | ICD-10-CM | POA: Diagnosis not present

## 2015-07-19 NOTE — Progress Notes (Signed)
  Oncology Nurse Navigator Documentation   Navigator Encounter Type: Treatment (07/19/15 1035)     Barriers/Navigation Needs: No barriers at this time (07/19/15 1035)          To provide support and encouragement, care continuity and to assess for needs, met with patient after daily RT. He reported somewhat increased hoarseness, throat soreness, both manageable.  He recognized that he has 7 more tmts, stated "Im making it". He did not express any needs or concerns at this time, I encouraged him to contact me if that changes, he verbalized understanding.  Gayleen Orem, RN, BSN, [place] at [place] [phone]

## 2015-07-20 ENCOUNTER — Ambulatory Visit
Admission: RE | Admit: 2015-07-20 | Discharge: 2015-07-20 | Disposition: A | Discharge status: Home / Self Care | Payer: Medicare Other | Source: Ambulatory Visit | Attending: Radiation Oncology | Admitting: Radiation Oncology

## 2015-07-20 ENCOUNTER — Ambulatory Visit: Payer: Medicare Other | Attending: Radiation Oncology | Admitting: Radiation Oncology

## 2015-07-20 DIAGNOSIS — E039 Hypothyroidism, unspecified: Secondary | ICD-10-CM | POA: Diagnosis not present

## 2015-07-20 DIAGNOSIS — M810 Age-related osteoporosis without current pathological fracture: Secondary | ICD-10-CM | POA: Diagnosis not present

## 2015-07-20 DIAGNOSIS — R972 Elevated prostate specific antigen [PSA]: Secondary | ICD-10-CM | POA: Diagnosis not present

## 2015-07-20 DIAGNOSIS — C320 Malignant neoplasm of glottis: Secondary | ICD-10-CM | POA: Diagnosis not present

## 2015-07-20 DIAGNOSIS — Z510 Encounter for antineoplastic radiation therapy: Secondary | ICD-10-CM | POA: Diagnosis not present

## 2015-07-20 DIAGNOSIS — F419 Anxiety disorder, unspecified: Secondary | ICD-10-CM | POA: Diagnosis not present

## 2015-07-20 DIAGNOSIS — I10 Essential (primary) hypertension: Secondary | ICD-10-CM | POA: Diagnosis not present

## 2015-07-20 DIAGNOSIS — K219 Gastro-esophageal reflux disease without esophagitis: Secondary | ICD-10-CM | POA: Diagnosis not present

## 2015-07-23 ENCOUNTER — Ambulatory Visit
Admission: RE | Admit: 2015-07-23 | Discharge: 2015-07-23 | Disposition: A | Discharge status: Home / Self Care | Payer: Medicare Other | Source: Ambulatory Visit | Attending: Radiation Oncology | Admitting: Radiation Oncology

## 2015-07-23 ENCOUNTER — Encounter: Payer: Self-pay | Admitting: Radiation Oncology

## 2015-07-23 VITALS — BP 135/78 | HR 70 | Temp 98.2°F | Ht 75.0 in | Wt 192.1 lb

## 2015-07-23 DIAGNOSIS — E039 Hypothyroidism, unspecified: Secondary | ICD-10-CM | POA: Diagnosis not present

## 2015-07-23 DIAGNOSIS — C320 Malignant neoplasm of glottis: Secondary | ICD-10-CM | POA: Diagnosis not present

## 2015-07-23 DIAGNOSIS — Z510 Encounter for antineoplastic radiation therapy: Secondary | ICD-10-CM | POA: Diagnosis not present

## 2015-07-23 DIAGNOSIS — I10 Essential (primary) hypertension: Secondary | ICD-10-CM | POA: Diagnosis not present

## 2015-07-23 DIAGNOSIS — F419 Anxiety disorder, unspecified: Secondary | ICD-10-CM | POA: Diagnosis not present

## 2015-07-23 DIAGNOSIS — K219 Gastro-esophageal reflux disease without esophagitis: Secondary | ICD-10-CM | POA: Diagnosis not present

## 2015-07-23 MED ORDER — BIAFINE EX EMUL
CUTANEOUS | Status: DC | PRN
  Administered 2015-07-23: 12:00:00 via TOPICAL

## 2015-07-23 NOTE — Progress Notes (Signed)
Weekly Management Note:  Site: Glottic larynx Current Dose:  5600  cGy Projected Dose: 6600  cGy  Narrative: The patient is seen today for routine under treatment assessment. CBCT/MVCT images/port films were reviewed. The chart was reviewed.   He is without new complaints today.  He has not increased his oxycodone/APAP compared to last week.  His throat discomfort is still 7/10.  He was given Biafine cream to use for his desquamation along the anterior neck.  His weight remains stable.  Physical Examination:  Filed Vitals:   07/23/15 1104  BP: 135/78  Pulse: 70  Temp: 98.2 F (36.8 C)  .  Weight: 192 lb 1.6 oz (87.136 kg).  There is no neck adenopathy.  There is erythema and dry desquamation along the larynx.  Oral cavity and oropharynx unremarkable to inspection.  Impression: Tolerating radiation therapy well except for discomfort related to mucositis/esophagitis.  Plan: Continue radiation therapy as planned.

## 2015-07-23 NOTE — Progress Notes (Signed)
[name] has received 28 fractions to the glottic region.  Note erythema in the anterior neck region.  Continues to use Biafine.  Raspy, watery quality of his voice.  Note mild redness in throat and thicker saliva production. Grades throat discomfort as a level 7/10.  Given Biafine.   BP 135/78 mmHg  Pulse 70  Temp(Src) 98.2 F (36.8 C)  Ht 6\' 3"  (1.905 m)  Wt 192 lb 1.6 oz (87.136 kg)  BMI 24.01 kg/m2  SpO2 100%  Wt Readings from Last 3 Encounters:  07/23/15 192 lb 1.6 oz (87.136 kg)  07/16/15 192 lb 14.4 oz (87.499 kg)  07/09/15 194 lb 3.2 oz (88.089 kg)

## 2015-07-24 ENCOUNTER — Ambulatory Visit
Admission: RE | Admit: 2015-07-24 | Discharge: 2015-07-24 | Disposition: A | Discharge status: Home / Self Care | Payer: Medicare Other | Source: Ambulatory Visit | Attending: Radiation Oncology | Admitting: Radiation Oncology

## 2015-07-24 DIAGNOSIS — I10 Essential (primary) hypertension: Secondary | ICD-10-CM | POA: Diagnosis not present

## 2015-07-24 DIAGNOSIS — Z510 Encounter for antineoplastic radiation therapy: Secondary | ICD-10-CM | POA: Diagnosis not present

## 2015-07-24 DIAGNOSIS — E039 Hypothyroidism, unspecified: Secondary | ICD-10-CM | POA: Diagnosis not present

## 2015-07-24 DIAGNOSIS — K219 Gastro-esophageal reflux disease without esophagitis: Secondary | ICD-10-CM | POA: Diagnosis not present

## 2015-07-24 DIAGNOSIS — F419 Anxiety disorder, unspecified: Secondary | ICD-10-CM | POA: Diagnosis not present

## 2015-07-24 DIAGNOSIS — C320 Malignant neoplasm of glottis: Secondary | ICD-10-CM | POA: Diagnosis not present

## 2015-07-25 ENCOUNTER — Ambulatory Visit
Admission: RE | Admit: 2015-07-25 | Discharge: 2015-07-25 | Disposition: A | Discharge status: Home / Self Care | Payer: Medicare Other | Source: Ambulatory Visit | Attending: Radiation Oncology | Admitting: Radiation Oncology

## 2015-07-25 DIAGNOSIS — K219 Gastro-esophageal reflux disease without esophagitis: Secondary | ICD-10-CM | POA: Diagnosis not present

## 2015-07-25 DIAGNOSIS — Z510 Encounter for antineoplastic radiation therapy: Secondary | ICD-10-CM | POA: Diagnosis not present

## 2015-07-25 DIAGNOSIS — E039 Hypothyroidism, unspecified: Secondary | ICD-10-CM | POA: Diagnosis not present

## 2015-07-25 DIAGNOSIS — I10 Essential (primary) hypertension: Secondary | ICD-10-CM | POA: Diagnosis not present

## 2015-07-25 DIAGNOSIS — F419 Anxiety disorder, unspecified: Secondary | ICD-10-CM | POA: Diagnosis not present

## 2015-07-25 DIAGNOSIS — C320 Malignant neoplasm of glottis: Secondary | ICD-10-CM | POA: Diagnosis not present

## 2015-07-26 ENCOUNTER — Ambulatory Visit
Admission: RE | Admit: 2015-07-26 | Discharge: 2015-07-26 | Disposition: A | Discharge status: Home / Self Care | Payer: Medicare Other | Source: Ambulatory Visit | Attending: Radiation Oncology | Admitting: Radiation Oncology

## 2015-07-26 DIAGNOSIS — I10 Essential (primary) hypertension: Secondary | ICD-10-CM | POA: Diagnosis not present

## 2015-07-26 DIAGNOSIS — Z510 Encounter for antineoplastic radiation therapy: Secondary | ICD-10-CM | POA: Diagnosis not present

## 2015-07-26 DIAGNOSIS — C320 Malignant neoplasm of glottis: Secondary | ICD-10-CM | POA: Diagnosis not present

## 2015-07-26 DIAGNOSIS — K219 Gastro-esophageal reflux disease without esophagitis: Secondary | ICD-10-CM | POA: Diagnosis not present

## 2015-07-26 DIAGNOSIS — F419 Anxiety disorder, unspecified: Secondary | ICD-10-CM | POA: Diagnosis not present

## 2015-07-26 DIAGNOSIS — E039 Hypothyroidism, unspecified: Secondary | ICD-10-CM | POA: Diagnosis not present

## 2015-07-27 ENCOUNTER — Ambulatory Visit
Admission: RE | Admit: 2015-07-27 | Discharge: 2015-07-27 | Disposition: A | Discharge status: Home / Self Care | Payer: Medicare Other | Source: Ambulatory Visit | Attending: Radiation Oncology | Admitting: Radiation Oncology

## 2015-07-27 DIAGNOSIS — E039 Hypothyroidism, unspecified: Secondary | ICD-10-CM | POA: Diagnosis not present

## 2015-07-27 DIAGNOSIS — F419 Anxiety disorder, unspecified: Secondary | ICD-10-CM | POA: Diagnosis not present

## 2015-07-27 DIAGNOSIS — K219 Gastro-esophageal reflux disease without esophagitis: Secondary | ICD-10-CM | POA: Diagnosis not present

## 2015-07-27 DIAGNOSIS — I10 Essential (primary) hypertension: Secondary | ICD-10-CM | POA: Diagnosis not present

## 2015-07-27 DIAGNOSIS — C320 Malignant neoplasm of glottis: Secondary | ICD-10-CM | POA: Diagnosis not present

## 2015-07-27 DIAGNOSIS — Z510 Encounter for antineoplastic radiation therapy: Secondary | ICD-10-CM | POA: Diagnosis not present

## 2015-07-30 ENCOUNTER — Ambulatory Visit
Admission: RE | Admit: 2015-07-30 | Discharge: 2015-07-30 | Disposition: A | Discharge status: Home / Self Care | Payer: Medicare Other | Source: Ambulatory Visit | Attending: Radiation Oncology | Admitting: Radiation Oncology

## 2015-07-30 ENCOUNTER — Encounter: Payer: Self-pay | Admitting: Critical Care Medicine

## 2015-07-30 ENCOUNTER — Encounter: Payer: Self-pay | Admitting: *Deleted

## 2015-07-30 ENCOUNTER — Encounter: Payer: Self-pay | Admitting: Radiation Oncology

## 2015-07-30 ENCOUNTER — Ambulatory Visit (INDEPENDENT_AMBULATORY_CARE_PROVIDER_SITE_OTHER): Payer: Medicare Other | Admitting: Critical Care Medicine

## 2015-07-30 VITALS — BP 124/86 | HR 78 | Temp 98.6°F | Ht 75.0 in | Wt 187.0 lb

## 2015-07-30 VITALS — BP 123/85 | HR 88 | Temp 98.5°F | Resp 16 | Ht 75.0 in | Wt 187.3 lb

## 2015-07-30 DIAGNOSIS — J4489 Other specified chronic obstructive pulmonary disease: Secondary | ICD-10-CM

## 2015-07-30 DIAGNOSIS — Z720 Tobacco use: Secondary | ICD-10-CM

## 2015-07-30 DIAGNOSIS — C320 Malignant neoplasm of glottis: Secondary | ICD-10-CM

## 2015-07-30 DIAGNOSIS — R911 Solitary pulmonary nodule: Secondary | ICD-10-CM | POA: Diagnosis not present

## 2015-07-30 DIAGNOSIS — I10 Essential (primary) hypertension: Secondary | ICD-10-CM | POA: Diagnosis not present

## 2015-07-30 DIAGNOSIS — F17200 Nicotine dependence, unspecified, uncomplicated: Secondary | ICD-10-CM | POA: Insufficient documentation

## 2015-07-30 DIAGNOSIS — E039 Hypothyroidism, unspecified: Secondary | ICD-10-CM | POA: Diagnosis not present

## 2015-07-30 DIAGNOSIS — Z510 Encounter for antineoplastic radiation therapy: Secondary | ICD-10-CM | POA: Diagnosis not present

## 2015-07-30 DIAGNOSIS — J449 Chronic obstructive pulmonary disease, unspecified: Secondary | ICD-10-CM | POA: Diagnosis not present

## 2015-07-30 DIAGNOSIS — F419 Anxiety disorder, unspecified: Secondary | ICD-10-CM | POA: Diagnosis not present

## 2015-07-30 DIAGNOSIS — K219 Gastro-esophageal reflux disease without esophagitis: Secondary | ICD-10-CM | POA: Diagnosis not present

## 2015-07-30 NOTE — Progress Notes (Addendum)
BP 128/79 mmHg  Pulse 79  Temp(Src) 98.5 F (36.9 C) (Oral)  Resp 16  Ht 6\' 3"  (1.905 m)  Wt 187 lb 4.8 oz (84.959 kg)  BMI 23.41 kg/m2  SpO2 99%   [name] has completed treatment with 33 fractions to his glottis.  He reports pain with swallowing at a 7/10. He is take percocet 2 tablets every 6 hours - 8 tablets daily.  He reports thickened saliva.  He denies having any mouth sores.  He reports his appetite is poor.  He drinks a boost occasional and eating a regular diet.  He has lost 5 lbs since last week. Orthostatic vitals done, bp sitting 128/79, hr 79, bp standing 123/85, hr 88.  He reports fatigue.  The skin on his anterior neck is red and dry.  He is using biafine.  BP 123/85 mmHg  Pulse 88  Temp(Src) 98.5 F (36.9 C) (Oral)  Resp 16  Ht 6\' 3"  (1.905 m)  Wt 187 lb 4.8 oz (84.959 kg)  BMI 23.41 kg/m2  SpO2 99%   Wt Readings from Last 3 Encounters:  07/30/15 187 lb 4.8 oz (84.959 kg)  07/30/15 187 lb (84.823 kg)  07/23/15 192 lb 1.6 oz (87.136 kg)

## 2015-07-30 NOTE — Patient Instructions (Signed)
You will be referred to the Lung cancer screening program No change in medicaitons Return 4 months with [name]

## 2015-07-30 NOTE — Progress Notes (Addendum)
[facility] End of Treatment Note  Name:Cordelro A Lerew  Date: 07/30/2015 OFV:[id number] DOB:1946/09/10   Status:outpatient    CC: Reginia Naas, MD   Dr. Izora Gala, Dr. Nicholaus Bloom  REFERRING PHYSICIAN:    Dr. 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 Virden tolerated his treatment reasonably well, but he had worsening throat discomfort including odynophagia midway through his course of therapy.  This was controlled with oxycodone immediate release when necessary.   Dr. Hardin Negus, his chronic pain specialist, allow him to increase his oxycodone to control his throat discomfort.                       PLAN: Routine followup in one month. Patient instructed to call if questions or worsening complaints in interim.

## 2015-07-30 NOTE — Assessment & Plan Note (Signed)
Tobacco use, now off smoking and using chronic nicorette gum

## 2015-07-30 NOTE — Progress Notes (Signed)
  Oncology Nurse Navigator Documentation   Navigator Encounter Type: Treatment (07/30/15 1030) Patient Visit Type: YBNLWH (07/30/15 1030) Treatment Phase: Final Radiation Tx (07/30/15 1030)     Met with pt during final RT to offer support and to celebrate end of radiation treatment.  He was accompanied by his wife. 1. I provided wife with a Certificate of Recognition. 2. I provided post-RT guidance:  Importance of keeping follow-up appts with Nutrition and SLP.  Importance of protecting treatment area from sun.  Continuation of Biafine application 2-3 times daily. 3. I explained that my role as navigator will continue for several more months and that I will be calling and/or joining him during follow-up visits.   4. I provided information about upcoming H&N FYNN, encouraged them both to attend. 5. I encouraged him to call me with needs/concerns.   6. Patient and wife verbalized understanding of information provided.  Gayleen Orem, RN, BSN, [place] at [place] [phone]                  Time Spent with Patient: 30 (07/30/15 1030)

## 2015-07-30 NOTE — Assessment & Plan Note (Signed)
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

## 2015-07-30 NOTE — Assessment & Plan Note (Signed)
Hx of benign LLL nodule, unchanged from 2007 to 2014.  Now with vocal cord CA and need for screening lung CT going forward with VC CA. Plan Refer to lung cancer screening program, if will not qualify, we will send pt for the scan

## 2015-07-30 NOTE — Progress Notes (Signed)
Subjective:    Patient ID: [name], male    DOB: 17-Oct-1946, 69 y.o.   MRN: [id number]  HPI 07/30/2015 Chief Complaint  Patient presents with  . 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  Hoarseness with radiation.  Occas chest tightness, but overall feels breathing is doing well. No cough.    Pt dx vocal cord Ca in 04/2015. Pt had more hoarseness.  Now with XRT.  Last Rx coming up.   [name] is ENT.  No LN Stage I .  Small CA seen.  Not much cough   Pt denies any significant sore throat, nasal congestion or excess secretions, fever, chills, sweats, unintended weight loss, pleurtic or exertional chest pain, orthopnea PND, or leg swelling Pt denies any increase in rescue therapy over baseline, denies waking up needing it or having any early am or nocturnal exacerbations of coughing/wheezing/or dyspnea. Pt also denies any obvious fluctuation in symptoms with  weather or environmental change or other alleviating or aggravating factors   Current Medications, Allergies, Complete Past Medical History, Past Surgical History, Family History, and Social History were reviewed in [facility] record per todays encounter:  07/30/2015  Review of Systems  Constitutional: Negative.   HENT: Negative.  Negative for ear pain, postnasal drip, rhinorrhea, sinus pressure, sore throat, trouble swallowing and voice change.        Hoarseness  Eyes: Negative.   Respiratory: Positive for cough and shortness of breath. Negative for apnea, choking, chest tightness, wheezing and stridor.   Cardiovascular: Negative.  Negative for chest pain, palpitations and leg swelling.  Gastrointestinal: Negative.  Negative for nausea, vomiting, abdominal pain and abdominal distention.  Genitourinary: Negative.   Musculoskeletal: Negative.  Negative for myalgias and arthralgias.  Skin: Negative.  Negative for rash.  Allergic/Immunologic: Negative.  Negative for environmental allergies  and food allergies.  Neurological: Negative.  Negative for dizziness, syncope, weakness and headaches.  Hematological: Negative.  Negative for adenopathy. Does not bruise/bleed easily.  Psychiatric/Behavioral: Negative.  Negative for sleep disturbance and agitation. The patient is not nervous/anxious.        Objective:   Physical Exam Filed Vitals:   07/30/15 0908  BP: 124/86  Pulse: 78  Temp: 98.6 F (37 C)  TempSrc: Oral  Height: 6\' 3"  (1.905 m)  Weight: 187 lb (84.823 kg)  SpO2: 98%    Gen: Pleasant, well-nourished, in no distress,  normal affect  ENT: No lesions,  mouth clear,  oropharynx clear, no postnasal drip, hoarse   Neck: No JVD, no TMG, no carotid bruits  Lungs: No use of accessory muscles, no dullness to percussion, distant BS   Cardiovascular: RRR, heart sounds normal, no murmur or gallops, no peripheral edema  Abdomen: soft and NT, no HSM,  BS normal  Musculoskeletal: No deformities, no cyanosis or clubbing  Neuro: alert, non focal  Skin: Warm, no lesions or rashes  No results found.     Assessment & Plan:  I personally reviewed all images and lab data in the [facility] system as well as any outside material available during this office visit and agree with the  radiology impressions.   Lung nodule Hx of benign LLL nodule, unchanged from 2007 to 2014.  Now with vocal cord CA and need for screening lung CT going forward with VC CA. 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

## 2015-07-30 NOTE — Assessment & Plan Note (Signed)
Squamous cell CA of vocal cord Rx per ENT / Rad Onc

## 2015-07-30 NOTE — Progress Notes (Signed)
Weekly Management Note:  Site: Glottic larynx Current Dose:  6600  cGy Projected Dose: 6600  cGy  Narrative: The patient is seen today for routine under treatment assessment. CBCT/MVCT images/port films were reviewed. The chart was reviewed.   He is without new complaints today.  He is lost 5 pounds over the past week.  His voice remains hoarse.  His pain level remains at 7/10.  He does take Percocet when necessary pain.  Physical Examination:  Filed Vitals:   07/30/15 1032  BP: 123/85  Pulse: 88  Temp:   Resp:   .  Weight: 187 lb 4.8 oz (84.959 kg).  He remains hoarse.  There is erythema and dry desquamation along the anterior neck.  Oral cavity and oropharynx are unremarkable to inspection.  Impression: Radiation therapy completed.  Plan: One-month follow-up.

## 2015-08-03 DIAGNOSIS — R070 Pain in throat: Secondary | ICD-10-CM | POA: Diagnosis not present

## 2015-08-03 DIAGNOSIS — M150 Primary generalized (osteo)arthritis: Secondary | ICD-10-CM | POA: Diagnosis not present

## 2015-08-03 DIAGNOSIS — Z79891 Long term (current) use of opiate analgesic: Secondary | ICD-10-CM | POA: Diagnosis not present

## 2015-08-03 DIAGNOSIS — G894 Chronic pain syndrome: Secondary | ICD-10-CM | POA: Diagnosis not present

## 2015-08-07 ENCOUNTER — Other Ambulatory Visit: Payer: Self-pay | Admitting: Acute Care

## 2015-08-07 ENCOUNTER — Encounter: Payer: Self-pay | Admitting: Acute Care

## 2015-08-07 DIAGNOSIS — Z87891 Personal history of nicotine dependence: Secondary | ICD-10-CM

## 2015-08-07 DIAGNOSIS — Z923 Personal history of irradiation: Secondary | ICD-10-CM | POA: Diagnosis not present

## 2015-08-07 DIAGNOSIS — C320 Malignant neoplasm of glottis: Secondary | ICD-10-CM | POA: Diagnosis not present

## 2015-08-07 DIAGNOSIS — R490 Dysphonia: Secondary | ICD-10-CM | POA: Diagnosis not present

## 2015-08-08 ENCOUNTER — Ambulatory Visit: Payer: Medicare Other | Admitting: Critical Care Medicine

## 2015-08-08 ENCOUNTER — Telehealth: Payer: Self-pay | Admitting: *Deleted

## 2015-08-08 NOTE — Telephone Encounter (Signed)
  Oncology Nurse Navigator Documentation   Navigator Encounter Type: Telephone (08/08/15 1513)     Called patient to check on his well-being since completing RT last week.  LVM with return call request.  Gayleen Orem, RN, BSN, [place] at [place] [phone]                     Time Spent with Patient: 15 (08/08/15 1513)

## 2015-08-13 ENCOUNTER — Inpatient Hospital Stay: Admission: RE | Admit: 2015-08-13 | Payer: Medicare Other | Source: Ambulatory Visit

## 2015-08-21 ENCOUNTER — Inpatient Hospital Stay: Admission: RE | Admit: 2015-08-21 | Payer: Medicare Other | Source: Ambulatory Visit

## 2015-08-21 ENCOUNTER — Encounter: Payer: Medicare Other | Admitting: Acute Care

## 2015-08-22 ENCOUNTER — Ambulatory Visit (INDEPENDENT_AMBULATORY_CARE_PROVIDER_SITE_OTHER): Payer: Medicare Other | Admitting: Acute Care

## 2015-08-22 ENCOUNTER — Encounter: Payer: Self-pay | Admitting: Acute Care

## 2015-08-22 ENCOUNTER — Ambulatory Visit (INDEPENDENT_AMBULATORY_CARE_PROVIDER_SITE_OTHER)
Admission: RE | Admit: 2015-08-22 | Discharge: 2015-08-22 | Disposition: A | Discharge status: Home / Self Care | Payer: Medicare Other | Source: Ambulatory Visit | Attending: Acute Care | Admitting: Acute Care

## 2015-08-22 DIAGNOSIS — Z87891 Personal history of nicotine dependence: Secondary | ICD-10-CM | POA: Diagnosis not present

## 2015-08-22 NOTE — Progress Notes (Signed)
Shared Decision Making Visit Lung Cancer Screening Program [phone])   Eligibility:  Age 69 y.o.  Pack Years Smoking History Calculation: 86 pack year smoking history (# packs/per year x # years smoked)  Recent History of coughing up blood  no  Unexplained weight loss? no ( >Than 15 pounds within the last 6 months )  Prior History Lung / other cancer : History of neck cancer (Diagnosis within the last 5 years already requiring surveillance chest CT Scans).No surveillance scans  Smoking Status Former Smoker  Former Smokers: Years since quit: 13 years  Quit Date: 2003  Visit Components:  Discussion included one or more decision making aids. yes  Discussion included risk/benefits of screening. yes  Discussion included potential follow up diagnostic testing for abnormal scans. yes  Discussion included meaning and risk of over diagnosis. yes  Discussion included meaning and risk of False Positives. yes  Discussion included meaning of total radiation exposure. yes  Counseling Included:  Importance of adherence to annual lung cancer LDCT screening. yes  Impact of comorbidities on ability to participate in the program. yes  Ability and willingness to under diagnostic treatment. yes  Smoking Cessation Counseling:  Current Smokers:   Discussed importance of smoking cessation.NA; Former smoker  Information about tobacco cessation classes and interventions provided to patient. NA  Patient provided with "ticket" for LDCT Scan. NA  Symptomatic Patient. no  CounselingNA  Diagnosis Code: Tobacco Use Z72.0  Asymptomatic Patient: Yes, former smoker  Counseling NA  Former Smokers:   Discussed the importance of maintaining cigarette abstinence. yes  Diagnosis Code: Personal History of Nicotine Dependence. Y63.785  Information about tobacco cessation classes and interventions provided to patient. Yes  Patient provided with "ticket" for LDCT Scan. yes  Written Order  for Lung Cancer Screening with LDCT placed in Epic. Yes (CT Chest Lung Cancer Screening Low Dose W/O CM) [id number] Z12.2-Screening of respiratory organs Z87.891-Personal history of nicotine dependence  I spent 15 minutes of face to face time with Mr. And [name] discussing the risks and benefits of Lung Cancer Screening. We viewed a power point together , pausing at intervals to allow for questions to be asked and answered. We spent time discussing the above noted topics to ensure understanding.We discussed that the single most powerful action he could take to decrease his risk of lung cancer was to quit smoking. He has already done this. He is a former smoker who quit 13 years ago, and has no intention of smoking again. We confirmed time and location of the screening scan. I gave [name] a copy of the power point we reviewed together to refer to in the future should he have any additional questions.He also has a copy of my card with contact information in the event he has any questions not addressed in the handout. I told him I would call with the results of the scan as soon as I have them. He verbalized understanding and had no further questions before leaving the office.   Magdalen Spatz, NP

## 2015-08-23 ENCOUNTER — Telehealth: Payer: Self-pay | Admitting: Acute Care

## 2015-08-23 NOTE — Telephone Encounter (Signed)
I called to give [name] his CT scan results. I explained to him that his scan was read as a Lung Rads 3. I explained that the left lower lobe nodule had increased slightly in size compared to previous scans., although the radiologist feels that it is a benign lesion. I explained that because there was an increase in size that we will rescan him in 6 months instead of 12 months. I explained that we will call him to schedule him for March. He verbalized understanding and agreed with the plan. I will share the results of the scan with Dr. Joya Gaskins , Alben Spittle( PCP) and Arloa Koh.

## 2015-09-03 ENCOUNTER — Encounter: Payer: Self-pay | Admitting: Radiation Oncology

## 2015-09-04 ENCOUNTER — Ambulatory Visit (HOSPITAL_COMMUNITY)
Admission: RE | Admit: 2015-09-04 | Discharge: 2015-09-04 | Disposition: A | Discharge status: Home / Self Care | Payer: Medicare Other | Source: Ambulatory Visit | Attending: Radiation Oncology | Admitting: Radiation Oncology

## 2015-09-04 ENCOUNTER — Ambulatory Visit (HOSPITAL_COMMUNITY)
Admission: RE | Admit: 2015-09-04 | Discharge: 2015-09-04 | Disposition: A | Discharge status: Home / Self Care | Payer: Medicare Other | Source: Ambulatory Visit | Attending: Internal Medicine | Admitting: Internal Medicine

## 2015-09-04 ENCOUNTER — Encounter: Payer: Self-pay | Admitting: Internal Medicine

## 2015-09-04 ENCOUNTER — Encounter: Payer: Self-pay | Admitting: Radiation Oncology

## 2015-09-04 ENCOUNTER — Ambulatory Visit (INDEPENDENT_AMBULATORY_CARE_PROVIDER_SITE_OTHER): Payer: Medicare Other | Admitting: Internal Medicine

## 2015-09-04 ENCOUNTER — Telehealth: Payer: Self-pay | Admitting: Internal Medicine

## 2015-09-04 VITALS — BP 132/84 | HR 82 | Temp 99.8°F | Ht 75.0 in | Wt 188.0 lb

## 2015-09-04 VITALS — BP 124/90 | HR 66 | Temp 98.6°F | Ht 75.0 in | Wt 187.6 lb

## 2015-09-04 DIAGNOSIS — R05 Cough: Secondary | ICD-10-CM | POA: Diagnosis not present

## 2015-09-04 DIAGNOSIS — C320 Malignant neoplasm of glottis: Secondary | ICD-10-CM | POA: Diagnosis not present

## 2015-09-04 DIAGNOSIS — R0789 Other chest pain: Secondary | ICD-10-CM

## 2015-09-04 DIAGNOSIS — R059 Cough, unspecified: Secondary | ICD-10-CM

## 2015-09-04 DIAGNOSIS — J449 Chronic obstructive pulmonary disease, unspecified: Secondary | ICD-10-CM | POA: Diagnosis not present

## 2015-09-04 DIAGNOSIS — Z923 Personal history of irradiation: Secondary | ICD-10-CM | POA: Insufficient documentation

## 2015-09-04 HISTORY — DX: Personal history of irradiation: Z92.3

## 2015-09-04 MED ORDER — PREDNISONE 10 MG PO TABS: ORAL_TABLET | ORAL | Status: DC

## 2015-09-04 MED ORDER — AMOXICILLIN-POT CLAVULANATE 875-125 MG PO TABS: 1.0000 | ORAL_TABLET | Freq: Two times a day (BID) | ORAL | Status: DC

## 2015-09-04 NOTE — Telephone Encounter (Signed)
Patient's wife just called about making appointment.  She states they are in [place] now, so if someone can call them ASAP.  [phone]

## 2015-09-04 NOTE — Progress Notes (Signed)
[name] states that he is swallowing and eating anything he wants.  He has a raspy voice.  Presently he has had a GI virus and coughing intermittently with tightness in chest.  To see his PCP  Fever last week. Sweating at night.  Seen by Dr. Constance Holster in September.

## 2015-09-04 NOTE — Telephone Encounter (Signed)
He needs a cxr and we can't do one here today so rec he go to [facility] - other option is cxr at [facility] and then come over after but if comes here needs to bring all meds with him

## 2015-09-04 NOTE — Progress Notes (Signed)
Subjective:    Patient ID: [name], male    DOB: January 07, 1946   MRN: [id number]  Brief patient profile:  89 yowm quit smoking 2003 with minimal airflow obst on pfts 09/09/15 c/w GOLD 0 copd previously followed by Dr [name]    History of Present Illness  07/30/2015 final Dr [name] ov: Chief Complaint  Patient presents with  . 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  Hoarseness with radiation.  Occas chest tightness, but overall feels breathing is doing well. No cough.    Pt dx vocal cord Ca in 04/2015. Pt had more hoarseness.  Now with XRT.  Last Rx coming up.   Dr [name] is ENT.  No LN Stage I .  Small CA seen.  Not much cough  rec You will be referred to the Lung cancer screening program No change in medicaitons Return 4 months with Dr [name]    09/04/2015 acute extended ov/Easten Maceachern re: aecopd / early pna on dulera 100 2bid  Chief Complaint  Patient presents with  . Acute Visit    Pt c/o increased cough, chest tightness and night sweats for the past wk.     acutely ill 08/29/15 with vomiting then diffuse bilateral cp / hacking cough  With minimal yellow sputum.  Worse days was 08/30/15 but more sob than baseline and still sever cough and sweats. Not using saba at all  No obvious day to day or daytime variability or assoc  Sob at rest or  subjective wheeze or overt sinus or hb symptoms. No unusual exp hx or h/o childhood pna/ asthma or knowledge of premature birth.  Sleeping ok without nocturnal  or early am exacerbation  of respiratory  c/o's or need for noct saba. Also denies any obvious fluctuation of symptoms with weather or environmental changes or other aggravating or alleviating factors except as outlined above   Current Medications, Allergies, Complete Past Medical History, Past Surgical History, Family History, and Social History were reviewed in [employer] record.  ROS  The following are not active complaints unless bolded sore  throat, dysphagia, dental problems, itching, sneezing,  nasal congestion or excess/ purulent secretions, ear ache,   fever, chills, sweats, unintended wt loss, classically pleuritic or exertional cp, hemoptysis,  orthopnea pnd or leg swelling, presyncope, palpitations, abdominal pain, anorexia, nausea, vomiting, diarrhea  or change in bowel or bladder habits, change in stools or urine, dysuria,hematuria,  rash, arthralgias, visual complaints, headache, numbness, weakness or ataxia or problems with walking or coordination,  change in mood/affect or memory.              Objective:   Physical Exam   Wt Readings from Last 3 Encounters:  09/04/15 188 lb (85.276 kg)  09/04/15 187 lb 9.6 oz (85.095 kg)  07/30/15 187 lb 4.8 oz (84.959 kg)    Vital signs reviewed    Gen: Pleasant, well-nourished, in no distress,  normal affect  ENT: No lesions,  mouth clear,  oropharynx clear, no postnasal drip, hoarse  - full dentures   Neck: No JVD, no TMG, no carotid bruits  Lungs: No use of accessory muscles, minimal insp and exp rhonchi bilaterally   Cardiovascular: RRR, heart sounds normal, no murmur or gallops, no peripheral edema  Abdomen: soft and NT, no HSM,  BS normal  Musculoskeletal: No deformities, no cyanosis or clubbing  Neuro: alert, non focal  Skin: Warm, no lesions or rashes  I personally reviewed images and agree with radiology impression as follows:  CXR:  09/04/2015    COPD. New subtle increased density in the lingula since the 2014 study which is not greatly changed since the CT scan of 2 weeks ago. This may reflect subtle interstitial infiltrate or atelectasis. Stable nodularity as described.    Assessment & Plan:

## 2015-09-04 NOTE — Progress Notes (Signed)
CC: Dr. Carol Ada, Dr. Lucien Mons  Follow-up note:  Mr. [name] returns today approximately 5 weeks following completion of radiation therapy in the management of his T1 N0 squamous cell carcinoma of the right true vocal cord.  He states that his voice is improved.  He did have a GI virus last week.  He is gaining weight.  He saw Dr. Constance Holster one week ago and was given a good report.  He had low dose CT lung cancer screening and is felt to have a benign nodule which will be followed up in 6 months.  He'll see Dr. Constance Holster next month.  Physical examination: Alert and oriented. Filed Vitals:   09/04/15 1159  BP: 124/90  Pulse: 66  Temp: 98.6 F (37 C)   Nodes: There is no palpable lymphadenopathy in the neck.  Oral cavity and oropharynx are unremarkable to inspection.  On indirect mirror examination there is erythema both true vocal cords with no masses or lesions.  Both true vocal cords oppose one another at the midline.  Impression: No evidence for recurrent disease.  Plan: He'll see Dr. Constance Holster next month and return to see me in 2 months.

## 2015-09-04 NOTE — Telephone Encounter (Signed)
Spoke with pt's wife, states pt is having sweats, chest tightness, nonprod cough since Sunday.  Denies fever, mucus production, sinus congestion, although pt had a fever last week d/t a stomach bug.  Pt has not taken anything to help with his s/s.  [facility] [address] [facility] [place]   MW please advise.  Thanks!

## 2015-09-04 NOTE — Telephone Encounter (Signed)
lmtcb x1 for pt. 

## 2015-09-04 NOTE — Telephone Encounter (Signed)
Pt has been scheduled to see MW today. He will have his CXR done prior at [facility]. Nothing further was needed.

## 2015-09-04 NOTE — Patient Instructions (Signed)
Augmentin 875 mg take one pill twice daily  X 10 days - take at breakfast and supper with large glass of water.  It would help reduce the usual side effects (diarrhea and yeast infections) if you ate cultured yogurt at lunch.   Prednisone 10 mg take  4 each am x 2 days,   2 each am x 2 days,  1 each am x 2 days and stop   Please schedule a follow up office visit in 6 weeks, call sooner if needed with cxr

## 2015-09-05 ENCOUNTER — Encounter: Payer: Self-pay | Admitting: Internal Medicine

## 2015-09-05 NOTE — Assessment & Plan Note (Signed)
PFTs 09/08/14 min airflow obst which did not meet criteria for copd - 12/08/2013 > try change to dulera 100 2bid> improved  Having mild acute flare in setting of ? Cap or more likely aspiration event > rec therefore augmentin and Prednisone 10 mg take  4 each am x 2 days,   2 each am x 2 days,  1 each am x 2 days and stop and f/u in 2 weeks if not back to baseline  The proper method of use, as well as anticipated side effects, of a metered-dose inhaler are discussed and demonstrated to the patient. Improved effectiveness after extensive coaching during this visit to a level of approximately  90%   I had an extended discussion with the patient reviewing all relevant studies completed to date and  lasting 15 to 20 minutes of a 25 minute visit    Each maintenance medication was reviewed in detail including most importantly the difference between maintenance and prns and under what circumstances the prns are to be triggered using an action plan format that is not reflected in the computer generated alphabetically organized AVS.    Please see instructions for details which were reviewed in writing and the patient given a copy highlighting the part that I personally wrote and discussed at today's ov.

## 2015-09-11 ENCOUNTER — Other Ambulatory Visit: Payer: Self-pay | Admitting: Gastroenterology

## 2015-09-11 DIAGNOSIS — K640 First degree hemorrhoids: Secondary | ICD-10-CM | POA: Diagnosis not present

## 2015-09-11 DIAGNOSIS — Z09 Encounter for follow-up examination after completed treatment for conditions other than malignant neoplasm: Secondary | ICD-10-CM | POA: Diagnosis not present

## 2015-09-11 DIAGNOSIS — K621 Rectal polyp: Secondary | ICD-10-CM | POA: Diagnosis not present

## 2015-09-11 DIAGNOSIS — Z8719 Personal history of other diseases of the digestive system: Secondary | ICD-10-CM | POA: Diagnosis not present

## 2015-09-28 DIAGNOSIS — M150 Primary generalized (osteo)arthritis: Secondary | ICD-10-CM | POA: Diagnosis not present

## 2015-09-28 DIAGNOSIS — R070 Pain in throat: Secondary | ICD-10-CM | POA: Diagnosis not present

## 2015-09-28 DIAGNOSIS — Z79891 Long term (current) use of opiate analgesic: Secondary | ICD-10-CM | POA: Diagnosis not present

## 2015-09-28 DIAGNOSIS — G894 Chronic pain syndrome: Secondary | ICD-10-CM | POA: Diagnosis not present

## 2015-10-05 DIAGNOSIS — Z8521 Personal history of malignant neoplasm of larynx: Secondary | ICD-10-CM | POA: Diagnosis not present

## 2015-10-05 DIAGNOSIS — R490 Dysphonia: Secondary | ICD-10-CM | POA: Diagnosis not present

## 2015-10-05 DIAGNOSIS — Z923 Personal history of irradiation: Secondary | ICD-10-CM | POA: Diagnosis not present

## 2015-10-16 ENCOUNTER — Encounter: Payer: Self-pay | Admitting: Internal Medicine

## 2015-10-16 ENCOUNTER — Ambulatory Visit (INDEPENDENT_AMBULATORY_CARE_PROVIDER_SITE_OTHER): Payer: Medicare Other | Admitting: Internal Medicine

## 2015-10-16 ENCOUNTER — Ambulatory Visit (INDEPENDENT_AMBULATORY_CARE_PROVIDER_SITE_OTHER)
Admission: RE | Admit: 2015-10-16 | Discharge: 2015-10-16 | Disposition: A | Discharge status: Home / Self Care | Payer: Medicare Other | Source: Ambulatory Visit | Attending: Internal Medicine | Admitting: Internal Medicine

## 2015-10-16 VITALS — BP 126/84 | HR 66 | Ht 75.0 in | Wt 191.4 lb

## 2015-10-16 DIAGNOSIS — R911 Solitary pulmonary nodule: Secondary | ICD-10-CM

## 2015-10-16 DIAGNOSIS — J449 Chronic obstructive pulmonary disease, unspecified: Secondary | ICD-10-CM | POA: Diagnosis not present

## 2015-10-16 NOTE — Patient Instructions (Signed)
When not coughing ok to leave the pm Zantac and after a few weeks if not coughing stop the protonix and take zantac after bfast x 2 weeks   At first onset of cough for any reason > Pantoprazole (protonix) 40 mg   Take  30-60 min before first meal of the day and Zantac 150 mg  one @  bedtime until return to office - this is the best way to tell whether stomach acid is contributing to your problem.  Please remember to go to the   x-ray department downstairs for your tests - we will call you with the results when they are available.    Keep follow up appts for scans and f/u with [name] as rec

## 2015-10-16 NOTE — Progress Notes (Signed)
Quick Note:  Spoke with pt and notified of results per Dr. Wert. Pt verbalized understanding and denied any questions.  ______ 

## 2015-10-16 NOTE — Progress Notes (Signed)
Subjective:    Patient ID: [name], male    DOB: [birth year]   MRN: [id number]  Brief patient profile:  44 yowm quit smoking 2003 with minimal airflow obst on pfts 09/09/15 c/w GOLD 0 copd previously followed by Dr [name]    History of Present Illness  07/30/2015 final Dr [name] ov: Chief Complaint  Patient presents with  . 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  Hoarseness with radiation.  Occas chest tightness, but overall feels breathing is doing well. No cough.    Pt dx vocal cord Ca in 04/2015. Pt had more hoarseness.  Now with XRT.  Last Rx coming up.   Dr [name] is ENT.  No LN Stage I .  Small CA seen.  Not much cough  rec You will be referred to the Lung cancer screening program No change in medicaitons Return 4 months with Dr [name]    09/04/2015 acute extended ov/[name] re: aecopd / early pna on dulera 100 2bid  Chief Complaint  Patient presents with  . Acute Visit    Pt c/o increased cough, chest tightness and night sweats for the past wk.     acutely ill 08/29/15 with vomiting then diffuse bilateral cp / hacking cough  With minimal yellow sputum.  Worse days was 08/30/15 but more sob than baseline and still sever cough and sweats. Not using saba at all rec Augmentin 875 mg take one pill twice daily  X 10 days -  Prednisone 10 mg take  4 each am x 2 days,   2 each am x 2 days,  1 each am x 2 days and stop     10/16/2015  f/u ov/[name] re: COPD GOLD 0/ f/u lung nodule maint on dulera 100 2bid  Chief Complaint  Patient presents with  . Follow-up    Pt states that his cough has resolved. He has not needed albuterol.  No new co's today.    Not limited by breathing from desired activities     No obvious day to day or daytime variability or assoc  Sob at rest or  subjective wheeze or overt sinus or hb symptoms. No unusual exp hx or h/o childhood pna/ asthma or knowledge of premature birth.  Sleeping ok without nocturnal  or early am exacerbation  of  respiratory  c/o's or need for noct saba. Also denies any obvious fluctuation of symptoms with weather or environmental changes or other aggravating or alleviating factors except as outlined above   Current Medications, Allergies, Complete Past Medical History, Past Surgical History, Family History, and Social History were reviewed in [employer] record.  ROS  The following are not active complaints unless bolded sore throat, dysphagia, dental problems, itching, sneezing,  nasal congestion or excess/ purulent secretions, ear ache,   fever, chills, sweats, unintended wt loss, classically pleuritic or exertional cp, hemoptysis,  orthopnea pnd or leg swelling, presyncope, palpitations, abdominal pain, anorexia, nausea, vomiting, diarrhea  or change in bowel or bladder habits, change in stools or urine, dysuria,hematuria,  rash, arthralgias, visual complaints, headache, numbness, weakness or ataxia or problems with walking or coordination,  change in mood/affect or memory.              Objective:   Physical Exam   10/16/2015      191   09/04/15 188 lb (85.276 kg)  09/04/15 187 lb 9.6 oz (85.095 kg)  07/30/15 187 lb 4.8  oz (84.959 kg)    Vital signs reviewed    Gen: Pleasant, ambulatory well-nourished, in no distress,  normal affect  ENT: No lesions,  mouth clear,  oropharynx clear, no postnasal drip, hoarse  - full dentures   Neck: No JVD, no TMG, no carotid bruits  Lungs: No use of accessory muscles,  Lungs clear bilaterally   Cardiovascular: RRR, heart sounds normal, no murmur or gallops, no peripheral edema  Abdomen: soft and NT, no HSM,  BS normal  Musculoskeletal: No deformities, no cyanosis or clubbing  Neuro: alert, non focal  Skin: Warm, no lesions or rashes    I personally reviewed images and agree with radiology impression as follows:  CXR:   10/16/2015    COPD and evidence of old granulomatous infection. No lingular mass or infiltrate is  observed today. Assessment & Plan:    Outpatient Encounter Prescriptions as of 10/16/2015  Medication Sig  . albuterol (VENTOLIN HFA) 108 (90 BASE) MCG/ACT inhaler Inhale 2 puffs into the lungs every 6 (six) hours as needed.  [name] Kitchen amLODipine (NORVASC) 5 MG tablet Take 5 mg by mouth daily.  . DULoxetine (CYMBALTA) 30 MG capsule Take 3 capsules by mouth daily.  [name] Kitchen levothyroxine (SYNTHROID, LEVOTHROID) 150 MCG tablet Take 150 mcg by mouth daily.    . mometasone-formoterol (DULERA) 100-5 MCG/ACT AERO Take 2 puffs first thing in am and then another 2 puffs about 12 hours later.  . nicotine polacrilex (NICORETTE) 4 MG gum Take 4 mg by mouth as needed for smoking cessation.  [name] Kitchen oxyCODONE (OXY IR/ROXICODONE) 5 MG immediate release tablet TAKE 1 TABLET EVERY 4 HOURS AS NEEDED  . oxyCODONE-acetaminophen (PERCOCET) 7.5-325 MG tablet Take 2 tablets by mouth every 6 (six) hours as needed.  . pantoprazole (PROTONIX) 40 MG tablet Take 1 tablet (40 mg total) by mouth daily. Take 30-60 min before first meal of the day  . ranitidine (ZANTAC) 150 MG capsule One at bedtime  . testosterone cypionate (DEPOTESTOTERONE CYPIONATE) 200 MG/ML injection Inject 0.5 mLs into the muscle once a week.  . Zoledronic Acid (ZOMETA IV) Inject into the vein every 3 (three) months.  . [DISCONTINUED] amoxicillin-clavulanate (AUGMENTIN) 875-125 MG tablet Take 1 tablet by mouth 2 (two) times daily.  . [DISCONTINUED] predniSONE (DELTASONE) 10 MG tablet Take  4 each am x 2 days,   2 each am x 2 days,  1 each am x 2 days and stop   No facility-administered encounter medications on file as of 10/16/2015.

## 2015-10-19 DIAGNOSIS — R972 Elevated prostate specific antigen [PSA]: Secondary | ICD-10-CM | POA: Diagnosis not present

## 2015-10-19 DIAGNOSIS — M810 Age-related osteoporosis without current pathological fracture: Secondary | ICD-10-CM | POA: Diagnosis not present

## 2015-10-21 ENCOUNTER — Encounter: Payer: Self-pay | Admitting: Internal Medicine

## 2015-10-21 NOTE — Assessment & Plan Note (Addendum)
PFTs 09/08/14 min airflow obst which did not meet criteria for copd - 12/08/2013 > try change to dulera 100 2bid> improved symptom c/w element of AB - 09/04/2015  extensive coaching HFA effectiveness =    90%   Adequate control on present rx, reviewed > no change in rx needed  = dulera 100 2bid and ok to taper off gerd rx if not coughing or having overt HBP symptoms  Pulmonary f/u is prn

## 2015-10-21 NOTE — Assessment & Plan Note (Signed)
Benign, no change over 4years CXR 11/08/12 Question new nodular density versus summation artifact at right upper lobe; >CT chest 11/09/2012 Patchy ground-glass attenuation in the central right upper lobe accounts for the abnormality seen on the recent chest x-ray. Thismay be related to an infectious or inflammatory alveolitis.Initial follow-up by chest CT without contrast is recommended in 3 months to confirm persistence>>tx w/ ABX and Steroids  follow up in office w/ cxr   06/08/2013 f/u CXR improved, clearance of infiltrate and benign nodule LLL persists> resolved on cxr 10/16/2015   08/24/2015 LDCT scan chest:  prob benign, LLL nodule slightly larger now 10mm.  F/u 01/2016 in computer  This case is confusing because all  of what we see on plain film has clearly resolved and yet the nodule for which the low-dose CT scanning was positive still needs to be followed and is already in the computer for recall in March 2017.  No pulmonary clinic follow-up care as needed at this point  - the lung cancer screening NP will arrange all f/u

## 2015-10-24 DIAGNOSIS — N139 Obstructive and reflux uropathy, unspecified: Secondary | ICD-10-CM | POA: Diagnosis not present

## 2015-10-24 DIAGNOSIS — E291 Testicular hypofunction: Secondary | ICD-10-CM | POA: Diagnosis not present

## 2015-10-24 DIAGNOSIS — R972 Elevated prostate specific antigen [PSA]: Secondary | ICD-10-CM | POA: Diagnosis not present

## 2015-10-24 DIAGNOSIS — N5201 Erectile dysfunction due to arterial insufficiency: Secondary | ICD-10-CM | POA: Diagnosis not present

## 2015-10-24 DIAGNOSIS — N401 Enlarged prostate with lower urinary tract symptoms: Secondary | ICD-10-CM | POA: Diagnosis not present

## 2015-10-31 ENCOUNTER — Telehealth: Payer: Self-pay | Admitting: *Deleted

## 2015-10-31 NOTE — Telephone Encounter (Signed)
  Oncology Nurse Navigator Documentation   Navigator Encounter Type: 3 month (10/31/15 1300) Patient Visit Type: Follow-up (10/31/15 1300)     Placed 64-month post-tmt follow-up call to check on patient's well-being.  He reported "doing fine": eating and drinking at baseline, no pain issues. He verbalized understanding of 11/14/15 0800 follow-up appt with Dr. Valere Dross. He did not express any needs or concerns at this time, I encouraged him to contact me if that changes, he verbalized understanding.  Gayleen Orem, RN, BSN, [place] [facility] [place] [phone]                    Time Spent with Patient: 15 (10/31/15 1300)

## 2015-11-14 ENCOUNTER — Ambulatory Visit
Admission: RE | Admit: 2015-11-14 | Discharge: 2015-11-14 | Disposition: A | Discharge status: Home / Self Care | Payer: Medicare Other | Source: Ambulatory Visit | Attending: Radiation Oncology | Admitting: Radiation Oncology

## 2015-11-14 ENCOUNTER — Encounter: Payer: Self-pay | Admitting: Radiation Oncology

## 2015-11-14 VITALS — BP 138/72 | HR 68 | Temp 97.5°F | Ht 75.0 in | Wt 192.4 lb

## 2015-11-14 DIAGNOSIS — C320 Malignant neoplasm of glottis: Secondary | ICD-10-CM | POA: Diagnosis not present

## 2015-11-14 NOTE — Progress Notes (Signed)
CC: Dr. Carol Ada, Dr. 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  He is doing well.  He saw Dr. Constance Holster last month and he will see him again in 2 months.  His voice has returned to normal.  He is scheduled for a follow-up low-dose CT scan for evaluation of a benign nodule in early 2017.  Physical examination: Alert and oriented. Filed Vitals:   11/14/15 0811  BP: 138/72  Pulse: 68  Temp: 97.5 F (36.4 C)   Nodes: There is no palpable lymphadenopathy in the neck.  Oral cavity and oropharynx unremarkable to inspection.  On indirect mirror examination there is no evidence for persistent disease along the right true vocal cord.  Both true vocal cords move well.  Impression: Satisfactory progress with no evidence for persistent disease.  Plan: I told Mr. Maurer that he can maintain follow-up with Dr. Constance Holster alone we can alternate with one of my associates here at the [facility] with my retirement later this month.  He prefers to be followed by Dr. Constance Holster alone.  I told him that I would let Dr. Constance Holster know so he can schedule his follow-up visits appropriately.

## 2015-11-14 NOTE — Progress Notes (Signed)
[name] here for reassessment s/p XRT for [name] of the Right True Vocal Cord.  He reports that he can eat anything he wants.   Note deep voice quality.  Seen by Dr. Constance Holster on 10/05/15.

## 2015-11-23 DIAGNOSIS — Z79891 Long term (current) use of opiate analgesic: Secondary | ICD-10-CM | POA: Diagnosis not present

## 2015-11-23 DIAGNOSIS — M150 Primary generalized (osteo)arthritis: Secondary | ICD-10-CM | POA: Diagnosis not present

## 2015-11-23 DIAGNOSIS — M47812 Spondylosis without myelopathy or radiculopathy, cervical region: Secondary | ICD-10-CM | POA: Diagnosis not present

## 2015-11-23 DIAGNOSIS — G894 Chronic pain syndrome: Secondary | ICD-10-CM | POA: Diagnosis not present

## 2015-12-04 ENCOUNTER — Telehealth: Payer: Self-pay | Admitting: Internal Medicine

## 2015-12-04 DIAGNOSIS — J329 Chronic sinusitis, unspecified: Secondary | ICD-10-CM | POA: Diagnosis not present

## 2015-12-04 NOTE — Telephone Encounter (Signed)
Spoke with pt's wife. Advised her that we are still awaiting TP's response.

## 2015-12-04 NOTE — Telephone Encounter (Signed)
Left detailed message on patient answering machine making aware that we received message regarding appt scheduled. Advised to call back if anything further needed. Will close message.

## 2015-12-04 NOTE — Telephone Encounter (Signed)
Pt has not heard anything and was checking on the status of this.

## 2015-12-04 NOTE — Telephone Encounter (Addendum)
Spoke with pt's wife. States that the pt has bronchitis. Reports increased coughing, wheezing and chest tightness. Cough is producing green mucus. Denies SOB or fever. Would like to be seen or having something sent in.  TP - please advise. Thanks.

## 2015-12-04 NOTE — Telephone Encounter (Signed)
Pt wife called back and said that she has gotten husband in to see pcp so you can discard msg.Hillery Hunter

## 2016-01-18 DIAGNOSIS — Z79891 Long term (current) use of opiate analgesic: Secondary | ICD-10-CM | POA: Diagnosis not present

## 2016-01-18 DIAGNOSIS — M47812 Spondylosis without myelopathy or radiculopathy, cervical region: Secondary | ICD-10-CM | POA: Diagnosis not present

## 2016-01-18 DIAGNOSIS — G894 Chronic pain syndrome: Secondary | ICD-10-CM | POA: Diagnosis not present

## 2016-01-18 DIAGNOSIS — Z125 Encounter for screening for malignant neoplasm of prostate: Secondary | ICD-10-CM | POA: Diagnosis not present

## 2016-01-18 DIAGNOSIS — M150 Primary generalized (osteo)arthritis: Secondary | ICD-10-CM | POA: Diagnosis not present

## 2016-01-18 DIAGNOSIS — M810 Age-related osteoporosis without current pathological fracture: Secondary | ICD-10-CM | POA: Diagnosis not present

## 2016-01-18 DIAGNOSIS — N401 Enlarged prostate with lower urinary tract symptoms: Secondary | ICD-10-CM | POA: Diagnosis not present

## 2016-01-21 DIAGNOSIS — Z923 Personal history of irradiation: Secondary | ICD-10-CM | POA: Diagnosis not present

## 2016-01-21 DIAGNOSIS — Z8521 Personal history of malignant neoplasm of larynx: Secondary | ICD-10-CM | POA: Diagnosis not present

## 2016-01-21 DIAGNOSIS — R490 Dysphonia: Secondary | ICD-10-CM | POA: Diagnosis not present

## 2016-02-13 ENCOUNTER — Other Ambulatory Visit: Payer: Self-pay | Admitting: Urology

## 2016-02-13 DIAGNOSIS — M810 Age-related osteoporosis without current pathological fracture: Secondary | ICD-10-CM

## 2016-02-19 ENCOUNTER — Other Ambulatory Visit: Payer: Self-pay | Admitting: Acute Care

## 2016-02-19 DIAGNOSIS — Z87891 Personal history of nicotine dependence: Secondary | ICD-10-CM

## 2016-03-13 ENCOUNTER — Ambulatory Visit (INDEPENDENT_AMBULATORY_CARE_PROVIDER_SITE_OTHER)
Admission: RE | Admit: 2016-03-13 | Discharge: 2016-03-13 | Disposition: A | Discharge status: Home / Self Care | Payer: Medicare Other | Source: Ambulatory Visit | Attending: Acute Care | Admitting: Acute Care

## 2016-03-13 DIAGNOSIS — J9809 Other diseases of bronchus, not elsewhere classified: Secondary | ICD-10-CM | POA: Diagnosis not present

## 2016-03-13 DIAGNOSIS — G894 Chronic pain syndrome: Secondary | ICD-10-CM | POA: Diagnosis not present

## 2016-03-13 DIAGNOSIS — R911 Solitary pulmonary nodule: Secondary | ICD-10-CM

## 2016-03-13 DIAGNOSIS — M150 Primary generalized (osteo)arthritis: Secondary | ICD-10-CM | POA: Diagnosis not present

## 2016-03-13 DIAGNOSIS — Z87891 Personal history of nicotine dependence: Secondary | ICD-10-CM

## 2016-03-13 DIAGNOSIS — J432 Centrilobular emphysema: Secondary | ICD-10-CM | POA: Diagnosis not present

## 2016-03-13 DIAGNOSIS — M47812 Spondylosis without myelopathy or radiculopathy, cervical region: Secondary | ICD-10-CM | POA: Diagnosis not present

## 2016-03-13 DIAGNOSIS — Z79891 Long term (current) use of opiate analgesic: Secondary | ICD-10-CM | POA: Diagnosis not present

## 2016-03-31 ENCOUNTER — Ambulatory Visit
Admission: RE | Admit: 2016-03-31 | Discharge: 2016-03-31 | Disposition: A | Discharge status: Home / Self Care | Payer: Medicare Other | Source: Ambulatory Visit | Attending: Urology | Admitting: Urology

## 2016-03-31 DIAGNOSIS — M85851 Other specified disorders of bone density and structure, right thigh: Secondary | ICD-10-CM | POA: Diagnosis not present

## 2016-03-31 DIAGNOSIS — M810 Age-related osteoporosis without current pathological fracture: Secondary | ICD-10-CM

## 2016-04-05 ENCOUNTER — Telehealth: Payer: Self-pay | Admitting: Acute Care

## 2016-04-05 DIAGNOSIS — Z87891 Personal history of nicotine dependence: Secondary | ICD-10-CM

## 2016-04-05 NOTE — Telephone Encounter (Signed)
I called to give the results of the low-dose CT to Mr. [name]. I explained that his scan was read as a lung RADS 2, indicating nodules that are benign in both appearance and behavior. I explained that the scan revealed he does have emphysema, and COPD. And we discussed the finding of atherosclerosis. The patient stated that his cholesterol has always been very low and that his primary care physician does monitor yearly. I explained that I will forward the results of the scan to Dr. Carol Ada the patient's primary care physician in addition to Dr. Christinia Gully the patient's pulmonologist. Patient verbalized understanding and had no further questions at the end of the call.

## 2016-04-18 DIAGNOSIS — Z923 Personal history of irradiation: Secondary | ICD-10-CM | POA: Diagnosis not present

## 2016-04-18 DIAGNOSIS — Z0000 Encounter for general adult medical examination without abnormal findings: Secondary | ICD-10-CM | POA: Diagnosis not present

## 2016-04-18 DIAGNOSIS — R490 Dysphonia: Secondary | ICD-10-CM | POA: Diagnosis not present

## 2016-04-18 DIAGNOSIS — Z8521 Personal history of malignant neoplasm of larynx: Secondary | ICD-10-CM | POA: Insufficient documentation

## 2016-04-18 DIAGNOSIS — M8580 Other specified disorders of bone density and structure, unspecified site: Secondary | ICD-10-CM | POA: Diagnosis not present

## 2016-04-18 DIAGNOSIS — M810 Age-related osteoporosis without current pathological fracture: Secondary | ICD-10-CM | POA: Diagnosis not present

## 2016-05-08 DIAGNOSIS — F3341 Major depressive disorder, recurrent, in partial remission: Secondary | ICD-10-CM | POA: Diagnosis not present

## 2016-05-08 DIAGNOSIS — K219 Gastro-esophageal reflux disease without esophagitis: Secondary | ICD-10-CM | POA: Diagnosis not present

## 2016-05-08 DIAGNOSIS — Z79891 Long term (current) use of opiate analgesic: Secondary | ICD-10-CM | POA: Diagnosis not present

## 2016-05-08 DIAGNOSIS — M47812 Spondylosis without myelopathy or radiculopathy, cervical region: Secondary | ICD-10-CM | POA: Diagnosis not present

## 2016-05-08 DIAGNOSIS — G894 Chronic pain syndrome: Secondary | ICD-10-CM | POA: Diagnosis not present

## 2016-05-08 DIAGNOSIS — M150 Primary generalized (osteo)arthritis: Secondary | ICD-10-CM | POA: Diagnosis not present

## 2016-05-08 DIAGNOSIS — I10 Essential (primary) hypertension: Secondary | ICD-10-CM | POA: Diagnosis not present

## 2016-05-08 DIAGNOSIS — E039 Hypothyroidism, unspecified: Secondary | ICD-10-CM | POA: Diagnosis not present

## 2016-06-26 ENCOUNTER — Other Ambulatory Visit: Payer: Self-pay | Admitting: Internal Medicine

## 2016-07-03 DIAGNOSIS — G894 Chronic pain syndrome: Secondary | ICD-10-CM | POA: Diagnosis not present

## 2016-07-03 DIAGNOSIS — M150 Primary generalized (osteo)arthritis: Secondary | ICD-10-CM | POA: Diagnosis not present

## 2016-07-03 DIAGNOSIS — Z79891 Long term (current) use of opiate analgesic: Secondary | ICD-10-CM | POA: Diagnosis not present

## 2016-07-08 ENCOUNTER — Telehealth: Payer: Self-pay | Admitting: Internal Medicine

## 2016-07-08 NOTE — Telephone Encounter (Signed)
[phone] wife calling back

## 2016-07-08 NOTE — Telephone Encounter (Signed)
LMTCB x 1 

## 2016-07-08 NOTE — Telephone Encounter (Signed)
LM x 1 Verify pharmacy for [facility] refill Pt needs OV with [name] to establish - OV 44min

## 2016-07-09 MED ORDER — MOMETASONE FURO-FORMOTEROL FUM 100-5 MCG/ACT IN AERO: INHALATION_SPRAY | RESPIRATORY_TRACT | 0 refills | Status: DC

## 2016-07-09 NOTE — Telephone Encounter (Signed)
Spoke with pt's wife. Pt has been scheduled for ROV with JN on 08/11/16 at 3pm. 30 day supply of medication will be sent to [facility] in [place]. Nothing further was needed.

## 2016-07-09 NOTE — Telephone Encounter (Signed)
[facility]  [phone] wife calling

## 2016-07-17 DIAGNOSIS — Z923 Personal history of irradiation: Secondary | ICD-10-CM | POA: Diagnosis not present

## 2016-07-17 DIAGNOSIS — R490 Dysphonia: Secondary | ICD-10-CM | POA: Diagnosis not present

## 2016-07-17 DIAGNOSIS — Z8521 Personal history of malignant neoplasm of larynx: Secondary | ICD-10-CM | POA: Diagnosis not present

## 2016-07-18 DIAGNOSIS — C61 Malignant neoplasm of prostate: Secondary | ICD-10-CM | POA: Diagnosis not present

## 2016-07-18 DIAGNOSIS — M810 Age-related osteoporosis without current pathological fracture: Secondary | ICD-10-CM | POA: Diagnosis not present

## 2016-08-11 ENCOUNTER — Telehealth: Payer: Self-pay | Admitting: Pulmonary Disease

## 2016-08-11 ENCOUNTER — Encounter: Payer: Self-pay | Admitting: Pulmonary Disease

## 2016-08-11 ENCOUNTER — Ambulatory Visit (INDEPENDENT_AMBULATORY_CARE_PROVIDER_SITE_OTHER): Payer: Medicare Other | Admitting: Pulmonary Disease

## 2016-08-11 VITALS — BP 142/74 | HR 70 | Ht 75.0 in | Wt 194.0 lb

## 2016-08-11 DIAGNOSIS — R918 Other nonspecific abnormal finding of lung field: Secondary | ICD-10-CM

## 2016-08-11 DIAGNOSIS — Z23 Encounter for immunization: Secondary | ICD-10-CM

## 2016-08-11 DIAGNOSIS — J449 Chronic obstructive pulmonary disease, unspecified: Secondary | ICD-10-CM

## 2016-08-11 DIAGNOSIS — K219 Gastro-esophageal reflux disease without esophagitis: Secondary | ICD-10-CM | POA: Diagnosis not present

## 2016-08-11 DIAGNOSIS — M810 Age-related osteoporosis without current pathological fracture: Secondary | ICD-10-CM | POA: Insufficient documentation

## 2016-08-11 MED ORDER — MOMETASONE FURO-FORMOTEROL FUM 100-5 MCG/ACT IN AERO: INHALATION_SPRAY | RESPIRATORY_TRACT | 1 refills | Status: DC

## 2016-08-11 NOTE — Telephone Encounter (Signed)
PFT 09/08/14: FVC 4.85 L (98%) FEV1 3.46 L (94%) FEV1/FVC 0.71 FEF 25-75 2.30 L (83%) negative bronchodilator response  IMAGING LD CT CHEST W/O 03/13/16 (per radiologist): Multiple pulmonary nodules bilaterally unchanged in size. Largest nodule 1.3 cm posterior aspect left lower lobe. Nodule reportedly stable going back to 03/28/05. Calcified nodules noted. Mild diffuse bronchial wall thickening & mild centrilobular/paraseptal emphysema. No pleural effusion. Calcified granulomas noted in the liver and spleen. Recommended repeat CT imaging in 12 months.  LD CHEST CT W/O 08/22/15 (per radiologist): Multiple pulmonary nodules bilaterally with largest in the posterior left lower lobe measuring 1.3 cm & stable compared to recent prior exam from 08/15/14. Nodule reportedly present and virtually unchanged compared with studies going back to 2007. Largest noncalcified pulmonary nodule in the medial aspect left lower lobe measuring 6 mm. Calcified nodules noted. Cylindrical) varicose bronchiectasis with thickening of. Bronchovascular interstitium and extensive peri-bronchovascular micro-nodularities with architectural distortion in the inferior segment of the lingula. Mild diffuse bronchial wall thickening and mild centrilobular and paraseptal emphysema. No pleural effusion. Recommended for short-term follow-up with imaging in 6 months.  LABS 10/22/10 IgE:  <1.5

## 2016-08-11 NOTE — Patient Instructions (Signed)
   Continue using your medications as prescribed.  You can try increasing your Zantac to 150mg  twice daily or 300mg  at night before bed if you need more medication to help you control your heartburn/reflux.  Consider getting the Tdap shot to help boost your protection from the whooping cough/pertussis.   Call me if you have any questions or new breathing problems before your next appointment.  We will see you back in 6 months or sooner if needed.  TESTS ORDERED: 1. Full PFTs at Follow-up

## 2016-08-11 NOTE — Progress Notes (Signed)
Subjective:    Patient ID: [name], male    DOB: [birth year], 70 y.o.   MRN: [id number]  C.C.:  Follow-up for Bilateral Lung Nodules, COPD, & GERD.  HPI Bilateral Lung Nodules:  Patient referred to me for lung nodules on his low-dose Chest CT imaging.   COPD:  Currently on Dulera. He has been on [facility] for years. Previously was on Advair. He reports he rarely if ever has to use his rescue inhaler. He denies any coughing and only rare wheezing.  He reports he gets bronchitis 1-2 times per year but has been less frequent in the last few years.   GERD:  He reports he takes Zantac nightly. Sometimes he does take a second dose in the daytime. He reports lately he has been waking up with morning brash water taste. Previously was on Protonix.   Review of Systems No chest pain or pressure. Rare chest tightness. No feer, chills, or sweats. No headaches or near syncope.   Allergies  Allergen Reactions  . Doxycycline Nausea And Vomiting  . Guaifenesin Nausea And Vomiting    Current Outpatient Prescriptions on File Prior to Visit  Medication Sig Dispense Refill  . albuterol (VENTOLIN HFA) 108 (90 BASE) MCG/ACT inhaler Inhale 2 puffs into the lungs every 6 (six) hours as needed. 1 Inhaler 3  . amLODipine (NORVASC) 5 MG tablet Take 10 mg by mouth daily.     . DULoxetine (CYMBALTA) 30 MG capsule Take 3 capsules by mouth daily.    [name] Kitchen levothyroxine (SYNTHROID, LEVOTHROID) 150 MCG tablet Take 150 mcg by mouth daily.      . mometasone-formoterol (DULERA) 100-5 MCG/ACT AERO Take 2 puffs first thing in am and then another 2 puffs about 12 hours later. 1 Inhaler 0  . nicotine polacrilex (NICORETTE) 4 MG gum Take 4 mg by mouth as needed for smoking cessation.    [name] Kitchen oxyCODONE-acetaminophen (PERCOCET) 7.5-325 MG tablet Take 2 tablets by mouth every 6 (six) hours as needed.  0  . ranitidine (ZANTAC) 150 MG capsule One at bedtime 30 capsule 11  . testosterone cypionate (DEPOTESTOTERONE CYPIONATE) 200 MG/ML  injection Inject 0.5 mLs into the muscle once a week.    . Zoledronic Acid (ZOMETA IV) Inject into the vein every 3 (three) months.     No current facility-administered medications on file prior to visit.     Past Medical History:  Diagnosis Date  . Anxiety state, unspecified   . Arthritis    osteo arthritis  . Chronic airway obstruction, not elsewhere classified   . Depressive disorder, not elsewhere classified   . GERD (gastroesophageal reflux disease)   . Hoarseness of voice   . Hypertension   . Hypothyroidism   . Osteoporosis   . Reflux gastritis   . S/P radiation therapy 06/14/2015 through 07/30/2015    Glottic larynx 6600 cGy in 33 sessions   . Squamous cell carcinoma of right vocal cord ([place]) 04/23/2015  . Tobacco chew use     Past Surgical History:  Procedure Laterality Date  . ABDOMINAL SURGERY  1971   terminal ileum removed, ileitis   . Biospy of the Right True Vocal Cord Right 04/23/2015  . MICROLARYNGOSCOPY Right 04/23/2015   Procedure: MICROLARYNGOSCOPY WITH BIOPSY RIGHT TRUE VOCAL CORD;  Surgeon: Izora Gala, MD;  Location: [facility];  Service: ENT;  Laterality: Right;  . SHOULDER SURGERY    . TONSILLECTOMY     as child    Family History  Problem  Relation Age of Onset  . Prostate cancer Father   . Lung cancer Father   . Allergies Sister     Social History   Social History  . Marital status: Married    Spouse name: N/A  . Number of children: N/A  . Years of education: N/A   Social History Main Topics  . Smoking status: Former Smoker    Packs/day: 2.00    Years: 43.00    Types: Cigarettes    Quit date: 12/01/2001  . Smokeless tobacco: Never Used     Comment: 2ppd x 43 years.  Still uses nicotine gum. Quit smoking 13 years ago  . Alcohol use No  . Drug use: No  . Sexual activity: Not Asked   Other Topics Concern  . None   Social History Narrative    Originally from [place]. Moved to [place] with job relocation in 1996. Married. Previously also lived in [place]. No international travel. 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 Currently has a dog. No bird exposure. No mold exposure. No hot tub exposure. Does wood working with domestic woods.       Objective:   Physical Exam BP (!) 142/74 (BP Location: Left Arm, Cuff Size: Normal)   Pulse 70   Ht 6\' 3"  (1.905 m)   Wt 194 lb (88 kg)   SpO2 99%   BMI 24.25 kg/m  General:  Awake. Alert. No acute distress. Sitting watching TV. Family at bedside.  Integument:  Warm & dry. No rash on exposed skin. No bruising. HEENT:  Moist mucus membranes. No oral ulcers. No scleral injection or icterus. Endotracheal tube in place. PERRL. Cardiovascular:  Regular rate. No edema. No appreciable JVD.  Pulmonary:  Good aeration & clear to auscultation bilaterally. Symmetric chest wall expansion. No accessory muscle use. Abdomen: Soft. Normal bowel sounds. Nondistended. Grossly nontender. Musculoskeletal:  Normal bulk and tone. Hand grip strength 5/5 bilaterally. No joint deformity or effusion appreciated.  PFT 09/08/14: FVC 4.85 L (98%) FEV1 3.46 L (94%) FEV1/FVC 0.71 FEF 25-75 2.30 L (83%) negative bronchodilator response  IMAGING LD CT CHEST W/O 03/13/16 (personally reviewed by me): Multiple pulmonary nodules bilaterally unchanged in size. Largest nodule 1.3 cm posterior aspect left lower lobe. Nodule reportedly stable going back to 03/28/05. Calcified nodules noted. Mild diffuse bronchial wall thickening & mild centrilobular/paraseptal emphysema. No pleural effusion. Calcified granulomas noted in the liver and spleen. No pleural thickening appreciated either. No pathologic mediastinal adenopathy. Recommended repeat CT imaging in 12 months. LLL lung nodule is indeed stable going back to 2006 on my review.   LD CHEST CT W/O 08/22/15 (per radiologist): Multiple pulmonary nodules bilaterally with  largest in the posterior left lower lobe measuring 1.3 cm & stable compared to recent prior exam from 08/15/14. Nodule reportedly present and virtually unchanged compared with studies going back to 2007. Largest noncalcified pulmonary nodule in the medial aspect left lower lobe measuring 6 mm. Calcified nodules noted. Cylindrical) varicose bronchiectasis with thickening of. Bronchovascular interstitium and extensive peri-bronchovascular micro-nodularities with architectural distortion in the inferior segment of the lingula. Mild diffuse bronchial wall thickening and mild centrilobular and paraseptal emphysema. No pleural effusion. Recommended for short-term follow-up with imaging in 6 months.  LABS 10/22/10 IgE:  <1.5    Assessment & Plan:  70 y.o. male with Underlying COPD and bilateral lung nodules. I personally reviewed his chest CT scan with him today which does show multiple lung nodules and most prominently a left  lower lobe nodule that is stable going back to 2006 on my review of his imaging. 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 Symptomatically his COPD seems to be very well-controlled at this time. We did spend a significant amount of time today discussing the need for complete immunization to help prevent further infection/bronchitis. He does continue to have symptoms that are minimally from his underlying reflux and I encouraged him to increase his Zantac if necessary. I instructed the patient contact my office if he had any new breathing problems or questions before his next appointment.  1. Bilateral Lung Nodules: Continuing with low dose yearly chest CT imaging for lung cancer screening. No need for more frequent imaging. 2. COPD: Continuing Dulera. Checking full pulmonary function testing at follow-up appointment. 3. GERD: Recommended increasing Zantac to 150 mg by  mouth twice a day or 300 mg by mouth daily at bedtime. 4. Health Maintenance: Administering high-dose influenza vaccine today. Patient to contemplate Tdap. Previously received Prevnar September 2015 & Pneumovax 22 September 2010. 5. Follow-up: Patient to return to clinic in 6 months or sooner if needed.  Sonia Baller Ashok Cordia, M.D. [facility]monary [facility] Critical [facility] Pager:  [phone] After 3pm or if no response, call [phone] 4:08 PM 08/11/16

## 2016-08-28 DIAGNOSIS — M150 Primary generalized (osteo)arthritis: Secondary | ICD-10-CM | POA: Diagnosis not present

## 2016-08-28 DIAGNOSIS — Z79891 Long term (current) use of opiate analgesic: Secondary | ICD-10-CM | POA: Diagnosis not present

## 2016-08-28 DIAGNOSIS — G894 Chronic pain syndrome: Secondary | ICD-10-CM | POA: Diagnosis not present

## 2016-09-18 DIAGNOSIS — E860 Dehydration: Secondary | ICD-10-CM | POA: Diagnosis not present

## 2016-09-18 DIAGNOSIS — R112 Nausea with vomiting, unspecified: Secondary | ICD-10-CM | POA: Diagnosis not present

## 2016-09-18 DIAGNOSIS — R634 Abnormal weight loss: Secondary | ICD-10-CM | POA: Diagnosis not present

## 2016-09-22 DIAGNOSIS — Z79891 Long term (current) use of opiate analgesic: Secondary | ICD-10-CM | POA: Diagnosis not present

## 2016-09-22 DIAGNOSIS — M150 Primary generalized (osteo)arthritis: Secondary | ICD-10-CM | POA: Diagnosis not present

## 2016-09-22 DIAGNOSIS — G894 Chronic pain syndrome: Secondary | ICD-10-CM | POA: Diagnosis not present

## 2016-10-13 DIAGNOSIS — Z923 Personal history of irradiation: Secondary | ICD-10-CM | POA: Diagnosis not present

## 2016-10-13 DIAGNOSIS — R490 Dysphonia: Secondary | ICD-10-CM | POA: Diagnosis not present

## 2016-10-13 DIAGNOSIS — Z8521 Personal history of malignant neoplasm of larynx: Secondary | ICD-10-CM | POA: Diagnosis not present

## 2016-10-17 DIAGNOSIS — R972 Elevated prostate specific antigen [PSA]: Secondary | ICD-10-CM | POA: Diagnosis not present

## 2016-10-17 DIAGNOSIS — M810 Age-related osteoporosis without current pathological fracture: Secondary | ICD-10-CM | POA: Diagnosis not present

## 2016-10-17 DIAGNOSIS — C61 Malignant neoplasm of prostate: Secondary | ICD-10-CM | POA: Diagnosis not present

## 2016-10-30 ENCOUNTER — Inpatient Hospital Stay (HOSPITAL_COMMUNITY)
Admission: EM | Admit: 2016-10-30 | Discharge: 2016-11-06 | DRG: 415 | Disposition: A | Discharge status: Home / Self Care | Payer: Medicare Other | Attending: Internal Medicine | Admitting: Internal Medicine

## 2016-10-30 ENCOUNTER — Inpatient Hospital Stay (HOSPITAL_COMMUNITY): Payer: Medicare Other

## 2016-10-30 ENCOUNTER — Emergency Department (HOSPITAL_COMMUNITY): Payer: Medicare Other

## 2016-10-30 ENCOUNTER — Encounter (HOSPITAL_COMMUNITY): Payer: Self-pay | Admitting: Vascular Surgery

## 2016-10-30 DIAGNOSIS — Z79899 Other long term (current) drug therapy: Secondary | ICD-10-CM | POA: Diagnosis not present

## 2016-10-30 DIAGNOSIS — K449 Diaphragmatic hernia without obstruction or gangrene: Secondary | ICD-10-CM | POA: Diagnosis not present

## 2016-10-30 DIAGNOSIS — I10 Essential (primary) hypertension: Secondary | ICD-10-CM | POA: Diagnosis not present

## 2016-10-30 DIAGNOSIS — Z923 Personal history of irradiation: Secondary | ICD-10-CM | POA: Diagnosis not present

## 2016-10-30 DIAGNOSIS — K7689 Other specified diseases of liver: Secondary | ICD-10-CM | POA: Diagnosis present

## 2016-10-30 DIAGNOSIS — N179 Acute kidney failure, unspecified: Secondary | ICD-10-CM | POA: Diagnosis not present

## 2016-10-30 DIAGNOSIS — Z9889 Other specified postprocedural states: Secondary | ICD-10-CM

## 2016-10-30 DIAGNOSIS — K810 Acute cholecystitis: Principal | ICD-10-CM | POA: Diagnosis present

## 2016-10-30 DIAGNOSIS — M1990 Unspecified osteoarthritis, unspecified site: Secondary | ICD-10-CM | POA: Diagnosis present

## 2016-10-30 DIAGNOSIS — E039 Hypothyroidism, unspecified: Secondary | ICD-10-CM | POA: Diagnosis not present

## 2016-10-30 DIAGNOSIS — R1031 Right lower quadrant pain: Secondary | ICD-10-CM | POA: Diagnosis not present

## 2016-10-30 DIAGNOSIS — R066 Hiccough: Secondary | ICD-10-CM | POA: Diagnosis present

## 2016-10-30 DIAGNOSIS — Z801 Family history of malignant neoplasm of trachea, bronchus and lung: Secondary | ICD-10-CM

## 2016-10-30 DIAGNOSIS — R000 Tachycardia, unspecified: Secondary | ICD-10-CM | POA: Diagnosis present

## 2016-10-30 DIAGNOSIS — D134 Benign neoplasm of liver: Secondary | ICD-10-CM | POA: Diagnosis not present

## 2016-10-30 DIAGNOSIS — K219 Gastro-esophageal reflux disease without esophagitis: Secondary | ICD-10-CM | POA: Diagnosis present

## 2016-10-30 DIAGNOSIS — M6281 Muscle weakness (generalized): Secondary | ICD-10-CM

## 2016-10-30 DIAGNOSIS — E871 Hypo-osmolality and hyponatremia: Secondary | ICD-10-CM | POA: Diagnosis present

## 2016-10-30 DIAGNOSIS — K920 Hematemesis: Secondary | ICD-10-CM | POA: Diagnosis not present

## 2016-10-30 DIAGNOSIS — K740 Hepatic fibrosis: Secondary | ICD-10-CM | POA: Diagnosis not present

## 2016-10-30 DIAGNOSIS — Z87891 Personal history of nicotine dependence: Secondary | ICD-10-CM | POA: Diagnosis not present

## 2016-10-30 DIAGNOSIS — F411 Generalized anxiety disorder: Secondary | ICD-10-CM | POA: Diagnosis not present

## 2016-10-30 DIAGNOSIS — Z888 Allergy status to other drugs, medicaments and biological substances status: Secondary | ICD-10-CM

## 2016-10-30 DIAGNOSIS — R109 Unspecified abdominal pain: Secondary | ICD-10-CM | POA: Diagnosis present

## 2016-10-30 DIAGNOSIS — K828 Other specified diseases of gallbladder: Secondary | ICD-10-CM | POA: Diagnosis present

## 2016-10-30 DIAGNOSIS — M810 Age-related osteoporosis without current pathological fracture: Secondary | ICD-10-CM | POA: Diagnosis present

## 2016-10-30 DIAGNOSIS — R911 Solitary pulmonary nodule: Secondary | ICD-10-CM | POA: Diagnosis present

## 2016-10-30 DIAGNOSIS — R1011 Right upper quadrant pain: Secondary | ICD-10-CM | POA: Diagnosis not present

## 2016-10-30 DIAGNOSIS — K660 Peritoneal adhesions (postprocedural) (postinfection): Secondary | ICD-10-CM | POA: Diagnosis present

## 2016-10-30 DIAGNOSIS — Z8042 Family history of malignant neoplasm of prostate: Secondary | ICD-10-CM

## 2016-10-30 DIAGNOSIS — J440 Chronic obstructive pulmonary disease with acute lower respiratory infection: Secondary | ICD-10-CM | POA: Diagnosis present

## 2016-10-30 DIAGNOSIS — Z01818 Encounter for other preprocedural examination: Secondary | ICD-10-CM | POA: Diagnosis not present

## 2016-10-30 DIAGNOSIS — D638 Anemia in other chronic diseases classified elsewhere: Secondary | ICD-10-CM | POA: Diagnosis not present

## 2016-10-30 DIAGNOSIS — R112 Nausea with vomiting, unspecified: Secondary | ICD-10-CM | POA: Diagnosis present

## 2016-10-30 DIAGNOSIS — Z5331 Laparoscopic surgical procedure converted to open procedure: Secondary | ICD-10-CM | POA: Diagnosis not present

## 2016-10-30 DIAGNOSIS — Z8521 Personal history of malignant neoplasm of larynx: Secondary | ICD-10-CM

## 2016-10-30 DIAGNOSIS — K8000 Calculus of gallbladder with acute cholecystitis without obstruction: Secondary | ICD-10-CM | POA: Diagnosis not present

## 2016-10-30 DIAGNOSIS — J449 Chronic obstructive pulmonary disease, unspecified: Secondary | ICD-10-CM | POA: Diagnosis not present

## 2016-10-30 DIAGNOSIS — K819 Cholecystitis, unspecified: Secondary | ICD-10-CM | POA: Diagnosis not present

## 2016-10-30 LAB — URINE MICROSCOPIC-ADD ON: RBC / HPF: NONE SEEN RBC/hpf (ref 0–5)

## 2016-10-30 LAB — COMPREHENSIVE METABOLIC PANEL
ALK PHOS: 23 U/L — AB (ref 38–126)
ALT: 26 U/L (ref 17–63)
AST: 25 U/L (ref 15–41)
Albumin: 4 g/dL (ref 3.5–5.0)
Anion gap: 12 (ref 5–15)
BUN: 30 mg/dL — ABNORMAL HIGH (ref 6–20)
CALCIUM: 9.3 mg/dL (ref 8.9–10.3)
CO2: 26 mmol/L (ref 22–32)
CREATININE: 1.34 mg/dL — AB (ref 0.61–1.24)
Chloride: 94 mmol/L — ABNORMAL LOW (ref 101–111)
GFR, EST AFRICAN AMERICAN: 59 mL/min — AB (ref >60)
GFR, EST NON AFRICAN AMERICAN: 51 mL/min — AB (ref >60)
Glucose, Bld: 127 mg/dL — ABNORMAL HIGH (ref 65–99)
Potassium: 4.3 mmol/L (ref 3.5–5.1)
Sodium: 132 mmol/L — ABNORMAL LOW (ref 135–145)
Total Bilirubin: 1.1 mg/dL (ref 0.3–1.2)
Total Protein: 7.9 g/dL (ref 6.5–8.1)

## 2016-10-30 LAB — URINALYSIS, ROUTINE W REFLEX MICROSCOPIC
Glucose, UA: NEGATIVE mg/dL
HGB URINE DIPSTICK: NEGATIVE
KETONES UR: NEGATIVE mg/dL
Leukocytes, UA: NEGATIVE
Nitrite: NEGATIVE
PROTEIN: 30 mg/dL — AB
Specific Gravity, Urine: 1.02 (ref 1.005–1.030)
pH: 5.5 (ref 5.0–8.0)

## 2016-10-30 LAB — LACTIC ACID, PLASMA
LACTIC ACID, VENOUS: 2 mmol/L — AB (ref 0.5–1.9)
Lactic Acid, Venous: 2.2 mmol/L (ref 0.5–1.9)

## 2016-10-30 LAB — CBC
HCT: 46.1 % (ref 39.0–52.0)
Hemoglobin: 15.8 g/dL (ref 13.0–17.0)
MCH: 31.2 pg (ref 26.0–34.0)
MCHC: 34.3 g/dL (ref 30.0–36.0)
MCV: 90.9 fL (ref 78.0–100.0)
PLATELETS: 251 10*3/uL (ref 150–400)
RBC: 5.07 MIL/uL (ref 4.22–5.81)
RDW: 13.9 % (ref 11.5–15.5)
WBC: 30.7 10*3/uL — AB (ref 4.0–10.5)

## 2016-10-30 LAB — LIPASE, BLOOD: Lipase: 17 U/L (ref 11–51)

## 2016-10-30 LAB — TSH: TSH: 2.277 u[IU]/mL (ref 0.350–4.500)

## 2016-10-30 MED ORDER — PIPERACILLIN-TAZOBACTAM 3.375 G IVPB 30 MIN
3.3750 g | Freq: Once | INTRAVENOUS | Status: AC
  Administered 2016-10-30: 3.375 g via INTRAVENOUS
  Filled 2016-10-30: qty 50

## 2016-10-30 MED ORDER — ONDANSETRON HCL 4 MG/2ML IJ SOLN
4.0000 mg | Freq: Once | INTRAMUSCULAR | Status: AC
  Administered 2016-10-30: 4 mg via INTRAVENOUS
  Filled 2016-10-30: qty 2

## 2016-10-30 MED ORDER — IOPAMIDOL (ISOVUE-300) INJECTION 61%
INTRAVENOUS | Status: AC
  Administered 2016-10-30: 100 mL
  Filled 2016-10-30: qty 100

## 2016-10-30 MED ORDER — ONDANSETRON 4 MG PO TBDP
ORAL_TABLET | ORAL | Status: AC
  Filled 2016-10-30: qty 1

## 2016-10-30 MED ORDER — HYDROMORPHONE HCL 2 MG/ML IJ SOLN
1.0000 mg | Freq: Once | INTRAMUSCULAR | Status: AC
  Administered 2016-10-30: 1 mg via INTRAVENOUS
  Filled 2016-10-30: qty 1

## 2016-10-30 MED ORDER — SODIUM CHLORIDE 0.9 % IV BOLUS (SEPSIS)
1000.0000 mL | Freq: Once | INTRAVENOUS | Status: AC
  Administered 2016-10-30: 1000 mL via INTRAVENOUS

## 2016-10-30 MED ORDER — HEPARIN SODIUM (PORCINE) 5000 UNIT/ML IJ SOLN
5000.0000 [IU] | Freq: Three times a day (TID) | INTRAMUSCULAR | Status: AC
  Administered 2016-10-30 (×2): 5000 [IU] via SUBCUTANEOUS
  Filled 2016-10-30 (×2): qty 1

## 2016-10-30 MED ORDER — DULOXETINE HCL 60 MG PO CPEP
60.0000 mg | ORAL_CAPSULE | Freq: Every day | ORAL | Status: DC
  Administered 2016-10-30: 60 mg via ORAL
  Filled 2016-10-30: qty 1

## 2016-10-30 MED ORDER — ACETAMINOPHEN 650 MG RE SUPP: 650.0000 mg | Freq: Four times a day (QID) | RECTAL | Status: DC | PRN

## 2016-10-30 MED ORDER — HYDROMORPHONE HCL 2 MG/ML IJ SOLN
1.0000 mg | INTRAMUSCULAR | Status: DC | PRN
  Administered 2016-10-30 – 2016-10-31 (×4): 1 mg via INTRAVENOUS
  Filled 2016-10-30 (×4): qty 1

## 2016-10-30 MED ORDER — MOMETASONE FURO-FORMOTEROL FUM 100-5 MCG/ACT IN AERO
2.0000 | INHALATION_SPRAY | Freq: Two times a day (BID) | RESPIRATORY_TRACT | Status: DC
  Administered 2016-10-30 – 2016-11-06 (×12): 2 via RESPIRATORY_TRACT
  Filled 2016-10-30 (×2): qty 8.8

## 2016-10-30 MED ORDER — METOPROLOL TARTRATE 5 MG/5ML IV SOLN
2.5000 mg | Freq: Four times a day (QID) | INTRAVENOUS | Status: DC
  Administered 2016-10-30 – 2016-11-06 (×26): 2.5 mg via INTRAVENOUS
  Filled 2016-10-30 (×27): qty 5

## 2016-10-30 MED ORDER — DEXTROSE-NACL 5-0.45 % IV SOLN: INTRAVENOUS | Status: AC

## 2016-10-30 MED ORDER — ALBUTEROL SULFATE HFA 108 (90 BASE) MCG/ACT IN AERS: 2.0000 | INHALATION_SPRAY | Freq: Four times a day (QID) | RESPIRATORY_TRACT | Status: DC | PRN

## 2016-10-30 MED ORDER — ONDANSETRON 4 MG PO TBDP
4.0000 mg | ORAL_TABLET | Freq: Once | ORAL | Status: AC | PRN
  Administered 2016-10-30: 4 mg via ORAL

## 2016-10-30 MED ORDER — MORPHINE SULFATE (PF) 4 MG/ML IV SOLN
4.0000 mg | Freq: Once | INTRAVENOUS | Status: AC
  Administered 2016-10-30: 4 mg via INTRAVENOUS
  Filled 2016-10-30: qty 1

## 2016-10-30 MED ORDER — HYDROMORPHONE HCL 2 MG/ML IJ SOLN
2.0000 mg | Freq: Once | INTRAMUSCULAR | Status: AC
  Administered 2016-10-30: 2 mg via INTRAVENOUS
  Filled 2016-10-30: qty 1

## 2016-10-30 MED ORDER — HYDRALAZINE HCL 20 MG/ML IJ SOLN: 5.0000 mg | INTRAMUSCULAR | Status: DC | PRN

## 2016-10-30 MED ORDER — ALBUTEROL SULFATE (2.5 MG/3ML) 0.083% IN NEBU: 2.5000 mg | INHALATION_SOLUTION | Freq: Four times a day (QID) | RESPIRATORY_TRACT | Status: DC | PRN

## 2016-10-30 MED ORDER — SODIUM CHLORIDE 0.9 % IV SOLN
INTRAVENOUS | Status: AC
  Administered 2016-10-30: 15:00:00 via INTRAVENOUS

## 2016-10-30 MED ORDER — ACETAMINOPHEN 325 MG PO TABS
650.0000 mg | ORAL_TABLET | Freq: Four times a day (QID) | ORAL | Status: DC | PRN
  Administered 2016-11-01: 650 mg via ORAL
  Filled 2016-10-30: qty 2

## 2016-10-30 MED ORDER — PROMETHAZINE HCL 25 MG/ML IJ SOLN
6.2500 mg | Freq: Four times a day (QID) | INTRAMUSCULAR | Status: DC | PRN
  Administered 2016-10-31 – 2016-11-05 (×3): 6.25 mg via INTRAVENOUS
  Filled 2016-10-30 (×3): qty 1

## 2016-10-30 MED ORDER — PIPERACILLIN-TAZOBACTAM 3.375 G IVPB
3.3750 g | Freq: Three times a day (TID) | INTRAVENOUS | Status: DC
  Administered 2016-10-30 – 2016-11-06 (×20): 3.375 g via INTRAVENOUS
  Filled 2016-10-30 (×22): qty 50

## 2016-10-30 NOTE — ED Notes (Signed)
Pt states it started on Tuesday. Had previously same pain about 6 weeks ago. But this time he had coffee ground vomitus.

## 2016-10-30 NOTE — Progress Notes (Signed)
Pharmacy Antibiotic Note  [name] is a 70 y.o. male admitted on 10/30/2016 with intra-abdominal infection.  Pharmacy has been consulted for Zosyn dosing.  Day #1 of Zosyn for intra-abdominal infection. Given a dose of Zosyn in the ED. SCr 1.34, CrCl ~37ml/min.  Plan: Continue Zosyn 3.375 gm IV q8h (4 hour infusion) Monitor clinical picture, renal function F/U C&S, abx deescalation / LOT   Height: 6\' 3"  (190.5 cm) Weight: 185 lb (83.9 kg) IBW/kg (Calculated) : 84.5  Temp (24hrs), Avg:97.7 F (36.5 C), Min:97.7 F (36.5 C), Max:97.7 F (36.5 C)   Recent Labs Lab 10/30/16 1051  WBC 30.7*  CREATININE 1.34*    Estimated Creatinine Clearance: 59.1 mL/min (by C-G formula based on SCr of 1.34 mg/dL (H)).    Allergies  Allergen Reactions  . Doxycycline Nausea And Vomiting  . Guaifenesin Nausea And Vomiting    Antimicrobials this admission: Zosyn 11/30 >>   Dose adjustments this admission: n/a  Microbiology results: 11/30 BCx: sent  Thank you for allowing pharmacy to be a part of this patient's care.  Elenor Quinones, PharmD, BCPS Clinical Pharmacist Pager [phone] 10/30/2016 3:08 PM

## 2016-10-30 NOTE — ED Notes (Signed)
Attempted report 

## 2016-10-30 NOTE — Consult Note (Signed)
[facility] Surgery Consult Note  [name] 12-30-1945  [id number].    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  Presents to [facility] with 3 days of epigastric pain. Patient states the pain gradually worsened and now involves his right upper and lower abdomen. It is constant,sharp, and non-radiating. Associated symptoms include nausea and vomiting refractory to zofran. Roughly 10 episodes of vomiting daily since Tuesday. Also endorses coffee ground emesis this morning. Past abdominal surgeries include emergent laparotomy with small bowel resection 47 years ago, at which time his appendix was removed, for small bowel perforation secondary to ileitis. Patient denies any postoperative episodes of ileitis/inflammatory bowel symptoms. Denies and PMH of gastric or peptic ulcers. Denies use of blood thinning medications. Takes oxycodone at home for arthritis.   ED workup: vitals signs stable. WBC 30.7, Scr 1.34, LFT's and lipase WNL, CT abd/pelvis w/ contrast significant for distended gallbladder with gallbladder wall edema and pericholecystic fluid. No stones appreciated. Small hiatal hernia present.  ROS: Review of Systems  Constitutional: Negative for chills and fever.  Cardiovascular: Negative for chest pain and palpitations.  Gastrointestinal: Positive for abdominal pain, nausea and vomiting. Negative for blood in stool, constipation, diarrhea, heartburn and melena.  Genitourinary: Negative for dysuria.  Musculoskeletal: Positive for joint pain. Negative for myalgias.  Neurological: Negative for dizziness.   Family History  Problem Relation Age of Onset  . Prostate cancer Father   . Lung cancer Father   . Allergies Sister     Past Medical History:  Diagnosis Date  . Anxiety state, unspecified   . Arthritis     osteo arthritis  . Chronic airway obstruction, not elsewhere classified   . Depressive disorder, not elsewhere classified   . GERD (gastroesophageal reflux disease)   . Hoarseness of voice   . Hypertension   . Hypothyroidism   . Osteoporosis   . Reflux gastritis   . S/P radiation therapy 06/14/2015 through 07/30/2015    Glottic larynx 6600 cGy in 33 sessions   . 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 Tobacco chew use     Past Surgical History:  Procedure Laterality Date  . ABDOMINAL SURGERY  1971   terminal ileum removed, ileitis   . Biospy of the Right True Vocal Cord Right 04/23/2015  . MICROLARYNGOSCOPY Right 04/23/2015   Procedure: MICROLARYNGOSCOPY WITH BIOPSY RIGHT TRUE VOCAL CORD;  Surgeon: Izora Gala, MD;  Location: [place];  Service: ENT;  Laterality: Right;  . SHOULDER SURGERY    . TONSILLECTOMY     as child    Social History:  reports that he quit smoking about 14 years ago. His smoking use included Cigarettes. He has a 86.00 pack-year smoking history. He has never used smokeless tobacco. He reports that he does not drink alcohol or use drugs.  Allergies:  Allergies  Allergen Reactions  . Doxycycline Nausea And Vomiting  . Guaifenesin Nausea And Vomiting     (Not in a hospital admission)  Blood pressure 141/79, pulse 94, temperature 97.7 F (36.5 C), temperature source Oral, resp. rate 23, height 6' 3"  (1.905 m), weight 185 lb (83.9 kg), SpO2 98 %. Physical Exam: General: pleasant, thin white male who is laying in bed in obvious abdominal discomfort HEENT: head is normocephalic, atraumatic. Mouth is dry Heart: regular, rate, and rhythm.  No  obvious murmurs, gallops, or rubs noted. Lungs: CTAB, no wheezes, rhonchi, or rales noted.  Respiratory effort nonlabored Abd: soft, TTP right lower quadrant and right mid abdomen, +BS previous laparotomy  scar MS: all 4 extremities are symmetrical with no cyanosis, clubbing, or edema. Skin: warm and dry with no masses, lesions, or rashes Psych: A&Ox3 with an appropriate affect. Neuro: moves all extremities spontaneously, normal speech  Results for orders placed or performed during the hospital encounter of 10/30/16 (from the past 48 hour(s))  Lipase, blood     Status: None   Collection Time: 10/30/16 10:51 AM  Result Value Ref Range   Lipase 17 11 - 51 U/L  Comprehensive metabolic panel     Status: Abnormal   Collection Time: 10/30/16 10:51 AM  Result Value Ref Range   Sodium 132 (L) 135 - 145 mmol/L   Potassium 4.3 3.5 - 5.1 mmol/L   Chloride 94 (L) 101 - 111 mmol/L   CO2 26 22 - 32 mmol/L   Glucose, Bld 127 (H) 65 - 99 mg/dL   BUN 30 (H) 6 - 20 mg/dL   Creatinine, Ser 1.34 (H) 0.61 - 1.24 mg/dL   Calcium 9.3 8.9 - 10.3 mg/dL   Total Protein 7.9 6.5 - 8.1 g/dL   Albumin 4.0 3.5 - 5.0 g/dL   AST 25 15 - 41 U/L   ALT 26 17 - 63 U/L   Alkaline Phosphatase 23 (L) 38 - 126 U/L   Total Bilirubin 1.1 0.3 - 1.2 mg/dL   GFR calc non Af Amer 51 (L) >60 mL/min   GFR calc Af Amer 59 (L) >60 mL/min    Comment: (NOTE) The eGFR has been calculated using the CKD EPI equation. This calculation has not been validated in all clinical situations. eGFR's persistently <60 mL/min signify possible Chronic Kidney Disease.    Anion gap 12 5 - 15  CBC     Status: Abnormal   Collection Time: 10/30/16 10:51 AM  Result Value Ref Range   WBC 30.7 (H) 4.0 - 10.5 K/uL   RBC 5.07 4.22 - 5.81 MIL/uL   Hemoglobin 15.8 13.0 - 17.0 g/dL   HCT 46.1 39.0 - 52.0 %   MCV 90.9 78.0 - 100.0 fL   MCH 31.2 26.0 - 34.0 pg   MCHC 34.3 30.0 - 36.0 g/dL   RDW 13.9 11.5 - 15.5 %   Platelets 251 150 - 400 K/uL   Ct Abdomen Pelvis W Contrast  Result Date: 10/30/2016 CLINICAL DATA:  Abdominal pain, vomiting for several days, history of Crohn's disease with prior partial colonic resection EXAM: CT ABDOMEN AND PELVIS  WITH CONTRAST TECHNIQUE: Multidetector CT imaging of the abdomen and pelvis was performed using the standard protocol following bolus administration of intravenous contrast. CONTRAST:  1 ISOVUE-300 IOPAMIDOL (ISOVUE-300) INJECTION 61% COMPARISON:  CT abdomen pelvis of 01/15/2013 FINDINGS: Lower chest: There is hyper aeration at the lung bases. A calcified granuloma is noted anteriorly in the right middle lobe. However, there is a noncalcified soft tissue nodule within the left lower lobe on image number 4 measuring 12 mm. Previously this lesion measured 11 mm, and this lack of interval change suggests a benign process. Hepatobiliary: There does appear to be a small hiatal hernia present. The liver enhances with no focal abnormality. However, there is abnormality of the gallbladder which appears distended with gallbladder wall edema and probable pericholecystic fluid. These findings are highly suspicious for acute cholecystitis. No calcified gallstones are seen. Pancreas: The pancreas  is normal in size and the pancreatic duct is not dilated. Spleen: The spleen is unremarkable other than multiple calcified splenic granulomas consistent with prior granulomatous disease Adrenals/Urinary Tract: The adrenal glands are unremarkable. The kidneys enhance with no calculus or mass and there is no evidence of hydronephrosis. A cyst as emanate from the upper pole of the left kidney which is unchanged. On delayed images, the pelvocaliceal systems are unremarkable. Ureters are normal in caliber. The urinary bladder is moderately distended and is indented by the significantly enlarged prostate. Stomach/Bowel: The stomach is decompressed and cannot be evaluated. There is some small bowel distension most likely due to mild ileus. No definite obstruction is seen. There is feces throughout the colon. Vascular/Lymphatic: The abdominal aorta is normal in caliber with moderate abdominal aortic atherosclerosis present. No adenopathy is  seen. Reproductive: The prostate is significantly enlarged measuring 5.9 x 6.7 cm appearing somewhat inhomogeneous. The prostate does indent the posterior inferior urinary bladder in the midline. Other: None Musculoskeletal: The lumbar vertebrae are in normal alignment. There is degenerative disc disease at L5-S1. The SI joints appear fused in this patient with inflammatory bowel disease. IMPRESSION: 1. Marked distention of the gallbladder with gallbladder wall edema and pericholecystic fluid, findings most consistent with acute cholecystitis. There are surrounding inflammatory changes as well. No definite gallstones are seen. 2. Stable noncalcified nodule in the left lower lobe compared to the CT from 2014 suggesting a benign process. 3. Small hiatal hernia. 4. Multiple calcified splenic granulomas from prior granulomatous disease. 5. Significant Li enlarged prostate indenting the urinary bladder. 6. Moderate abdominal aortic atherosclerosis. Critical Value/emergent results were called by telephone at the time of interpretation on 10/30/2016 at 1:35 pm to Dr. Gareth Morgan , who verbally acknowledged these results. Electronically Signed   By: Ivar Drape M.D.   On: 10/30/2016 13:35   Assessment/Plan Right upper quadrant pain suspicious for acute cholecystitis  Nausea/vomiting Leukocytosis - WBC 30, LFTs WNL - CT scan significant for gallbladder distention and gallbladder wall edema, possible pericholecystic fluid - RUQ U/S pending - likely laparoscopic cholecystectomy tomorrow by Dr. Donne Hazel - NPO, IVF, IV Zosyn, pain control, antiemetics, repeat labs in AM  Jill Alexanders, [facility] Surgery 10/30/2016, 2:15 PM Pager: [phone] Consults: [phone] Mon-Fri 7:00 am-4:30 pm Sat-Sun 7:00 am-11:30 am

## 2016-10-30 NOTE — ED Notes (Addendum)
Lactic acid of 2.0 was reported to Dr. Billy Fischer immediately.

## 2016-10-30 NOTE — ED Notes (Signed)
Transported to CT 

## 2016-10-30 NOTE — ED Notes (Signed)
Transported to [place].

## 2016-10-30 NOTE — ED Provider Notes (Signed)
[facility] DEPT Provider Note   CSN: [id number] Arrival date & time: 10/30/16  1025     History   Chief Complaint Chief Complaint  Patient presents with  . Abdominal Pain  . Emesis    HPI [name] is a 70 y.o. male.  HPI   Epigastric pain started on Tuesday, now and last night pain RLQ, severe pain Nausea and vomiting since Tuesday, unable to keep anything down, coffee ground emesis this AM.  No NSAID use. 10-12 episodes of emesis per day. No No hx of ulcers on stomach. Hx of ileitis 47 yrs ago, had surgery thought it was Crohn's, thought it resolved it's self per GI drs as colonoscopys normal -had 1 10 mos ago Had nausea medicine, similar symptoms 6 weeks ago similar symptoms but nausea medicine not helping this time. Yesterday temp 99.6.    Past Medical History:  Diagnosis Date  . Anxiety state, unspecified   . Arthritis    osteo arthritis  . Chronic airway obstruction, not elsewhere classified   . Depressive disorder, not elsewhere classified   . GERD (gastroesophageal reflux disease)   . Hoarseness of voice   . Hypertension   . Hypothyroidism   . Osteoporosis   . Reflux gastritis   . S/P radiation therapy 06/14/2015 through 07/30/2015    Glottic larynx 6600 cGy in 33 sessions   . 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 Tobacco chew use     Patient Active Problem List   Diagnosis Date Noted  . Cholecystitis 10/30/2016  . Abdominal pain 10/30/2016  . Nausea and vomiting 10/30/2016  . Acute kidney injury ([place]) 10/30/2016  . Hiatal hernia 10/30/2016  . Osteoporosis 08/11/2016  . Tobacco use disorder 07/30/2015  . 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 Hypogonadism male 02/24/2012  . Crohn's colitis ([place]) 02/24/2012  . GERD 01/14/2011  . ALLERGIC RHINITIS 10/22/2010  . Hypothyroidism 09/16/2007  . Anxiety state 09/16/2007  .  DEPRESSION 09/16/2007  . COPD GOLD 0  09/16/2007  . Lung nodule 09/16/2007    Past Surgical History:  Procedure Laterality Date  . ABDOMINAL SURGERY  1971   terminal ileum removed, ileitis   . Biospy of the Right True Vocal Cord Right 04/23/2015  . MICROLARYNGOSCOPY Right 04/23/2015   Procedure: MICROLARYNGOSCOPY WITH BIOPSY RIGHT TRUE VOCAL CORD;  Surgeon: Izora Gala, MD;  Location: [facility];  Service: ENT;  Laterality: Right;  . SHOULDER SURGERY    . TONSILLECTOMY     as child       Home Medications    Prior to Admission medications   Medication Sig Start Date End Date Taking? Authorizing Provider  albuterol (VENTOLIN HFA) 108 (90 BASE) MCG/ACT inhaler Inhale 2 puffs into the lungs every 6 (six) hours as needed. 05/05/13  Yes Elsie Stain, MD  amLODipine (NORVASC) 10 MG tablet Take 10 mg by mouth daily. 06/06/16  Yes Historical Provider, MD  DULoxetine (CYMBALTA) 30 MG capsule Take 3 capsules by mouth daily.   Yes Historical Provider, MD  levothyroxine (SYNTHROID, LEVOTHROID) 150 MCG tablet Take 150 mcg by mouth daily.     Yes Historical Provider, MD  mometasone-formoterol (DULERA) 100-5 MCG/ACT AERO Take 2 puffs first thing in am and then another 2 puffs about 12 hours later. 08/11/16  Yes Javier Glazier, MD  nicotine polacrilex (NICORETTE) 4 MG gum Take 4 mg by mouth as needed for smoking cessation.   Yes Historical  Provider, MD  ondansetron (ZOFRAN) 8 MG tablet Take 8 mg by mouth every 8 (eight) hours as needed for nausea. 09/18/16  Yes Historical Provider, MD  oxyCODONE-acetaminophen (PERCOCET) 7.5-325 MG tablet Take 2 tablets by mouth every 6 (six) hours as needed. 08/08/15  Yes Historical Provider, MD  ranitidine (ZANTAC) 150 MG capsule One at bedtime Patient taking differently: Take 150 mg by mouth every evening. One at bedtime 12/08/13  Yes Tanda Rockers, MD  testosterone cypionate (DEPOTESTOTERONE CYPIONATE) 200 MG/ML injection Inject 0.5 mLs into the  muscle once a week.   Yes Historical Provider, MD  Zoledronic Acid (ZOMETA IV) Inject into the vein every 3 (three) months.   Yes Historical Provider, MD    Family History Family History  Problem Relation Age of Onset  . Prostate cancer Father   . Lung cancer Father   . Allergies Sister     Social History Social History  Substance Use Topics  . Smoking status: Former Smoker    Packs/day: 2.00    Years: 43.00    Types: Cigarettes    Quit date: 12/01/2001  . Smokeless tobacco: Never Used     Comment: 2ppd x 43 years.  Still uses nicotine gum. Quit smoking 13 years ago  . Alcohol use No     Allergies   Doxycycline; Guaifenesin; Morphine sulfate; and Tapentadol   Review of Systems Review of Systems  Constitutional: Positive for activity change, appetite change, chills and fatigue. Negative for fever.  HENT: Negative for sore throat.   Eyes: Negative for visual disturbance.  Respiratory: Negative for shortness of breath.   Cardiovascular: Negative for chest pain.  Gastrointestinal: Positive for abdominal pain, nausea and vomiting. Negative for constipation and diarrhea.  Genitourinary: Negative for difficulty urinating.  Musculoskeletal: Negative for back pain and neck stiffness.  Skin: Negative for rash.  Neurological: Negative for syncope and headaches.     Physical Exam Updated Vital Signs BP 126/72   Pulse 101   Temp 97.7 F (36.5 C) (Oral)   Resp 16   Ht 6\' 3"  (1.905 m)   Wt 185 lb (83.9 kg)   SpO2 98%   BMI 23.12 kg/m   Physical Exam  Constitutional: He is oriented to person, place, and time. He appears well-developed and well-nourished. No distress.  HENT:  Head: Normocephalic and atraumatic.  Eyes: Conjunctivae and EOM are normal.  Neck: Normal range of motion.  Cardiovascular: Normal rate, regular rhythm, normal heart sounds and intact distal pulses.  Exam reveals no gallop and no friction rub.   No murmur heard. Pulmonary/Chest: Effort normal and  breath sounds normal. No respiratory distress. He has no wheezes. He has no rales.  Abdominal: He exhibits no distension. There is tenderness. There is guarding (RUQ and RLQ).  Musculoskeletal: He exhibits no edema.  Neurological: He is alert and oriented to person, place, and time.  Skin: Skin is warm and dry. He is not diaphoretic.  Nursing note and vitals reviewed.    ED Treatments / Results  Labs (all labs ordered are listed, but only abnormal results are displayed) Labs Reviewed  COMPREHENSIVE METABOLIC PANEL - Abnormal; Notable for the following:       Result Value   Sodium 132 (*)    Chloride 94 (*)    Glucose, Bld 127 (*)    BUN 30 (*)    Creatinine, Ser 1.34 (*)    Alkaline Phosphatase 23 (*)    GFR calc non Af Amer 51 (*)  GFR calc Af Amer 59 (*)    All other components within normal limits  CBC - Abnormal; Notable for the following:    WBC 30.7 (*)    All other components within normal limits  URINALYSIS, ROUTINE W REFLEX MICROSCOPIC (NOT AT [facility]) - Abnormal; Notable for the following:    Bilirubin Urine SMALL (*)    Protein, ur 30 (*)    All other components within normal limits  LACTIC ACID, PLASMA - Abnormal; Notable for the following:    Lactic Acid, Venous 2.0 (*)    All other components within normal limits  URINE MICROSCOPIC-ADD ON - Abnormal; Notable for the following:    Squamous Epithelial / LPF 0-5 (*)    Bacteria, UA RARE (*)    Casts HYALINE CASTS (*)    All other components within normal limits  CULTURE, BLOOD (ROUTINE X 2)  CULTURE, BLOOD (ROUTINE X 2)  LIPASE, BLOOD  TSH  LACTIC ACID, PLASMA  BASIC METABOLIC PANEL  CBC  PROTIME-INR    EKG  EKG Interpretation None       Radiology Ct Abdomen Pelvis W Contrast  Result Date: 10/30/2016 CLINICAL DATA:  Abdominal pain, vomiting for several days, history of Crohn's disease with prior partial colonic resection EXAM: CT ABDOMEN AND PELVIS WITH CONTRAST TECHNIQUE: Multidetector CT imaging  of the abdomen and pelvis was performed using the standard protocol following bolus administration of intravenous contrast. CONTRAST:  1 ISOVUE-300 IOPAMIDOL (ISOVUE-300) INJECTION 61% COMPARISON:  CT abdomen pelvis of 01/15/2013 FINDINGS: Lower chest: There is hyper aeration at the lung bases. A calcified granuloma is noted anteriorly in the right middle lobe. However, there is a noncalcified soft tissue nodule within the left lower lobe on image number 4 measuring 12 mm. Previously this lesion measured 11 mm, and this lack of interval change suggests a benign process. Hepatobiliary: There does appear to be a small hiatal hernia present. The liver enhances with no focal abnormality. However, there is abnormality of the gallbladder which appears distended with gallbladder wall edema and probable pericholecystic fluid. These findings are highly suspicious for acute cholecystitis. No calcified gallstones are seen. Pancreas: The pancreas is normal in size and the pancreatic duct is not dilated. Spleen: The spleen is unremarkable other than multiple calcified splenic granulomas consistent with prior granulomatous disease Adrenals/Urinary Tract: The adrenal glands are unremarkable. The kidneys enhance with no calculus or mass and there is no evidence of hydronephrosis. A cyst as emanate from the upper pole of the left kidney which is unchanged. On delayed images, the pelvocaliceal systems are unremarkable. Ureters are normal in caliber. The urinary bladder is moderately distended and is indented by the significantly enlarged prostate. Stomach/Bowel: The stomach is decompressed and cannot be evaluated. There is some small bowel distension most likely due to mild ileus. No definite obstruction is seen. There is feces throughout the colon. Vascular/Lymphatic: The abdominal aorta is normal in caliber with moderate abdominal aortic atherosclerosis present. No adenopathy is seen. Reproductive: The prostate is significantly  enlarged measuring 5.9 x 6.7 cm appearing somewhat inhomogeneous. The prostate does indent the posterior inferior urinary bladder in the midline. Other: None Musculoskeletal: The lumbar vertebrae are in normal alignment. There is degenerative disc disease at L5-S1. The SI joints appear fused in this patient with inflammatory bowel disease. IMPRESSION: 1. Marked distention of the gallbladder with gallbladder wall edema and pericholecystic fluid, findings most consistent with acute cholecystitis. There are surrounding inflammatory changes as well. No definite gallstones are seen. 2.  Stable noncalcified nodule in the left lower lobe compared to the CT from 2014 suggesting a benign process. 3. Small hiatal hernia. 4. Multiple calcified splenic granulomas from prior granulomatous disease. 5. Significant Li enlarged prostate indenting the urinary bladder. 6. Moderate abdominal aortic atherosclerosis. Critical Value/emergent results were called by telephone at the time of interpretation on 10/30/2016 at 1:35 pm to Dr. Gareth Morgan , who verbally acknowledged these results. Electronically Signed   By: Ivar Drape M.D.   On: 10/30/2016 13:35   Dg Chest Port 1 View  Result Date: 10/30/2016 CLINICAL DATA:  Preoperative examination prior to abdominal surgery. EXAM: PORTABLE CHEST 1 VIEW COMPARISON:  10/16/2015 and prior exams FINDINGS: The cardiomediastinal silhouette is unremarkable. There is no evidence of focal airspace disease, pulmonary edema, suspicious pulmonary nodule/mass, pleural effusion, or pneumothorax. No acute bony abnormalities are identified. IMPRESSION: No active disease. Electronically Signed   By: Margarette Canada M.D.   On: 10/30/2016 16:45   US Abdomen Limited Ruq  Result Date: 10/30/2016 CLINICAL DATA:  Right upper quadrant pain over the last 6 weeks. EXAM: US ABDOMEN LIMITED - RIGHT UPPER QUADRANT COMPARISON:  CT 10/30/2016 FINDINGS: Gallbladder: Distended gallbladder with mild wall thickening  and prominent pericholecystic edema. Partially shadowing filling defect within the gallbladder measuring 4 x 4 x 10 cm most consistent with tumefactive sludge. Common bile duct: Diameter: 5.7 mm, upper limits normal. Liver: Normal appearance of the liver. Trace free fluid adjacent to the right liver age. IMPRESSION: Findings most consistent with acute cholecystitis. The gallbladder is distended and shows pericholecystic fluid. Large tumefactive sludge ball within the gallbladder measuring up to 10 cm in diameter. Electronically Signed   By: Nelson Chimes M.D.   On: 10/30/2016 15:22    Procedures Procedures (including critical care time)  Medications Ordered in ED Medications  ondansetron (ZOFRAN-ODT) 4 MG disintegrating tablet (  Not Given 10/30/16 1151)  dextrose 5 %-0.45 % sodium chloride infusion ( Intravenous Not Given 10/30/16 1703)  mometasone-formoterol (DULERA) 100-5 MCG/ACT inhaler 2 puff (not administered)  albuterol (PROVENTIL HFA;VENTOLIN HFA) 108 (90 Base) MCG/ACT inhaler 2 puff (not administered)  heparin injection 5,000 Units (5,000 Units Subcutaneous Given 10/30/16 1659)  0.9 %  sodium chloride infusion ( Intravenous New Bag/Given 10/30/16 1500)  acetaminophen (TYLENOL) tablet 650 mg (not administered)    Or  acetaminophen (TYLENOL) suppository 650 mg (not administered)  HYDROmorphone (DILAUDID) injection 1 mg (not administered)  piperacillin-tazobactam (ZOSYN) IVPB 3.375 g (3.375 g Intravenous New Bag/Given 10/30/16 1733)  metoprolol (LOPRESSOR) injection 2.5 mg (2.5 mg Intravenous Given 10/30/16 1733)  hydrALAZINE (APRESOLINE) injection 5 mg (not administered)  DULoxetine (CYMBALTA) DR capsule 60 mg (60 mg Oral Given 10/30/16 1733)  promethazine (PHENERGAN) injection 6.25 mg (not administered)  ondansetron (ZOFRAN-ODT) disintegrating tablet 4 mg (4 mg Oral Given 10/30/16 1053)  sodium chloride 0.9 % bolus 1,000 mL (0 mLs Intravenous Stopped 10/30/16 1359)    piperacillin-tazobactam (ZOSYN) IVPB 3.375 g (0 g Intravenous Stopped 10/30/16 1359)  iopamidol (ISOVUE-300) 61 % injection (100 mLs  Contrast Given 10/30/16 1256)  morphine 4 MG/ML injection 4 mg (4 mg Intravenous Given 10/30/16 1151)  ondansetron (ZOFRAN) injection 4 mg (4 mg Intravenous Given 10/30/16 1150)  HYDROmorphone (DILAUDID) injection 1 mg (1 mg Intravenous Given 10/30/16 1407)  HYDROmorphone (DILAUDID) injection 2 mg (2 mg Intravenous Given 10/30/16 1659)     Initial Impression / Assessment and Plan / ED Course  I have reviewed the triage vital signs and the nursing notes.  Pertinent labs &  imaging results that were available during my care of the patient were reviewed by me and considered in my medical decision making (see chart for details).  Clinical Course    70 year old male with a history of squamous cell carcinoma of the right vocal cord, hypertension presents with concern for nausea and vomiting, and severe right-sided abdominal pain for 2 days.  Mild tachycardia on arrival, given IV fluids, guarding on exam, and ordered blood cx and zofran empirically as awaiting CT abdomen.    CT abdomen shows cholecystitis.  General surgery consulted and came to evaluate the patient. They recommend obtaining ultrasound and medicine consult for admission. Hospitalist was consulted and will admit patient. Pain and nausea improved with Dilaudid and zofran.  Final Clinical Impressions(s) / ED Diagnoses   Final diagnoses:  Cholecystitis    New Prescriptions New Prescriptions   No medications on file     Gareth Morgan, MD 10/30/16 1806

## 2016-10-30 NOTE — H&P (Signed)
History and Physical    PERICLES CLUSTER [id number] DOB: May 06, 1946 DOA: 10/30/2016  PCP: Reginia Naas, MD Patient coming from: home  Chief Complaint: abdominal pain/nausea and vomiting  HPI: [name] is a 70 y.o. 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 Initial evaluation includes CT of the abdomen concerning for cholecystitis leukocytosis acute kidney injury  Information is obtained from the patient and his wife and daughter who are at the bedside. He reports 3 day history of abdominal pain. States the pain located in the epigastric area initially and then spread to the right upper and lower abdomen. Describes as constant aching-like pain. Worse on the right upper and lower abdomen. Associated symptoms include nausea and vomiting. Reports 10 episodes emesis over last 2 days. One episode coffee ground emesis this am. Reports trying to drink water over the last 2 days but this may be abdominal pain worse and he would vomit shortly thereafter. He denies fever chills headache dizziness syncope or near-syncope. He denies dysuria hematuria frequency or urgency. He denies diarrhea constipation melena bright red blood per rectum. He denies chest pain palpitation shortness of breath.    ED Course: In the emergency department he is provided with IV fluids Zosyn analgesia.  Review of Systems: As per HPI otherwise 10 point review of systems negative. He is afebrile hemodynamically stable slightly tachycardic  Ambulatory Status: Ambulates independently with no falls.  Past Medical History:  Diagnosis Date  . Anxiety state, unspecified   . Arthritis    osteo arthritis  . Chronic airway obstruction, not elsewhere classified   . Depressive disorder, not elsewhere classified     . GERD (gastroesophageal reflux disease)   . Hoarseness of voice   . Hypertension   . Hypothyroidism   . Osteoporosis   . Reflux gastritis   . S/P radiation therapy 06/14/2015 through 07/30/2015    Glottic larynx 6600 cGy in 33 sessions   . 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 Tobacco chew use     Past Surgical History:  Procedure Laterality Date  . ABDOMINAL SURGERY  1971   terminal ileum removed, ileitis   . Biospy of the Right True Vocal Cord Right 04/23/2015  . MICROLARYNGOSCOPY Right 04/23/2015   Procedure: MICROLARYNGOSCOPY WITH BIOPSY RIGHT TRUE VOCAL CORD;  Surgeon: Izora Gala, MD;  Location: [facility];  Service: ENT;  Laterality: Right;  . SHOULDER SURGERY    . TONSILLECTOMY     as child    Social History   Social History  . Marital status: Married    Spouse name: N/A  . Number of children: N/A  . Years of education: N/A   Occupational History  . Not on file.   Social History Main Topics  . Smoking status: Former Smoker    Packs/day: 2.00    Years: 43.00    Types: Cigarettes    Quit date: 12/01/2001  . Smokeless tobacco: Never Used     Comment: 2ppd x 43 years.  Still uses nicotine gum. Quit smoking 13 years ago  . Alcohol use No  . Drug use: No  . Sexual activity: Not on file   Other Topics Concern  . Not on file   Social History Narrative   Originally from [place]. Moved to [place] with job relocation in 1996. Married.  Previously also lived in [place]. No international travel. 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 Currently has a dog. No bird exposure. No mold exposure. No hot tub exposure. Does wood working with domestic woods.     Allergies  Allergen Reactions  . Doxycycline Nausea And Vomiting  . Guaifenesin Nausea And Vomiting    Family History  Problem Relation Age of Onset  . Prostate cancer Father   . Lung  cancer Father   . Allergies Sister     Prior to Admission medications   Medication Sig Start Date End Date Taking? Authorizing Provider  albuterol (VENTOLIN HFA) 108 (90 BASE) MCG/ACT inhaler Inhale 2 puffs into the lungs every 6 (six) hours as needed. 05/05/13   Elsie Stain, MD  amLODipine (NORVASC) 10 MG tablet Take 10 mg by mouth daily. 06/06/16   Historical Provider, MD  amLODipine (NORVASC) 5 MG tablet Take 10 mg by mouth daily.     Historical Provider, MD  DULoxetine (CYMBALTA) 30 MG capsule Take 3 capsules by mouth daily.    Historical Provider, MD  levothyroxine (SYNTHROID, LEVOTHROID) 150 MCG tablet Take 150 mcg by mouth daily.      Historical Provider, MD  mometasone-formoterol (DULERA) 100-5 MCG/ACT AERO Take 2 puffs first thing in am and then another 2 puffs about 12 hours later. 08/11/16   Javier Glazier, MD  nicotine polacrilex (NICORETTE) 4 MG gum Take 4 mg by mouth as needed for smoking cessation.    Historical Provider, MD  oxyCODONE-acetaminophen (PERCOCET) 7.5-325 MG tablet Take 2 tablets by mouth every 6 (six) hours as needed. 08/08/15   Historical Provider, MD  ranitidine (ZANTAC) 150 MG capsule One at bedtime 12/08/13   Tanda Rockers, MD  testosterone cypionate (DEPOTESTOTERONE CYPIONATE) 200 MG/ML injection Inject 0.5 mLs into the muscle once a week.    Historical Provider, MD  Zoledronic Acid (ZOMETA IV) Inject into the vein every 3 (three) months.    Historical Provider, MD    Physical Exam: Vitals:   10/30/16 1345 10/30/16 1400 10/30/16 1415 10/30/16 1430  BP: 141/86 143/82 118/65 143/77  Pulse: 103 100 102 104  Resp: 25 17 18 17   Temp:      TempSrc:      SpO2: 100% 97% 95% 96%  Weight:      Height:         General:  Appears Slightly anxious and somewhat uncomfortable Eyes:  PERRL, EOMI, normal lids, iris ENT:  grossly normal hearing, lips & tongue, because membranes of his mouth are pink but dry Neck:  no LAD, masses or thyromegaly Cardiovascular:  RRR,  no m/r/g. No LE edema. Pedal pulses present and palpable Respiratory:  CTA bilaterally, no w/r/r. Normal respiratory effort. Abdomen:  Slightly distended slightly firm positive bowel sounds tender to palpation particularly on the right Skin:  no rash or induration seen on limited exam Musculoskeletal:  grossly normal tone BUE/BLE, good ROM, no bony abnormality Psychiatric:  grossly normal mood and affect, speech fluent and appropriate, AOx3 Neurologic:  CN 2-12 grossly intact, moves all extremities in coordinated fashion, sensation intact  Labs on Admission: I have personally reviewed following labs and imaging studies  CBC:  Recent Labs Lab 10/30/16 1051  WBC 30.7*  HGB 15.8  HCT 46.1  MCV 90.9  PLT 123XX123   Basic Metabolic Panel:  Recent Labs Lab 10/30/16 1051  NA 132*  K 4.3  CL 94*  CO2 26  GLUCOSE 127*  BUN 30*  CREATININE 1.34*  CALCIUM 9.3   GFR: Estimated Creatinine Clearance: 59.1 mL/min (by C-G formula based on SCr of 1.34 mg/dL (H)). Liver Function Tests:  Recent Labs Lab 10/30/16 1051  AST 25  ALT 26  ALKPHOS 23*  BILITOT 1.1  PROT 7.9  ALBUMIN 4.0    Recent Labs Lab 10/30/16 1051  LIPASE 17   No results for input(s): AMMONIA in the last 168 hours. Coagulation Profile: No results for input(s): INR, PROTIME in the last 168 hours. Cardiac Enzymes: No results for input(s): CKTOTAL, CKMB, CKMBINDEX, TROPONINI in the last 168 hours. BNP (last 3 results) No results for input(s): PROBNP in the last 8760 hours. HbA1C: No results for input(s): HGBA1C in the last 72 hours. CBG: No results for input(s): GLUCAP in the last 168 hours. Lipid Profile: No results for input(s): CHOL, HDL, LDLCALC, TRIG, CHOLHDL, LDLDIRECT in the last 72 hours. Thyroid Function Tests: No results for input(s): TSH, T4TOTAL, FREET4, T3FREE, THYROIDAB in the last 72 hours. Anemia Panel: No results for input(s): VITAMINB12, FOLATE, FERRITIN, TIBC, IRON, RETICCTPCT in the last  72 hours. Urine analysis:    Component Value Date/Time   COLORURINE YELLOW 10/30/2016 [place] 10/30/2016 1359   LABSPEC 1.020 10/30/2016 1359   PHURINE 5.5 10/30/2016 1359   GLUCOSEU NEGATIVE 10/30/2016 1359   HGBUR NEGATIVE 10/30/2016 1359   BILIRUBINUR SMALL (A) 10/30/2016 1359   KETONESUR NEGATIVE 10/30/2016 1359   PROTEINUR 30 (A) 10/30/2016 1359   UROBILINOGEN 0.2 01/15/2013 1043   NITRITE NEGATIVE 10/30/2016 1359   LEUKOCYTESUR NEGATIVE 10/30/2016 1359    Creatinine Clearance: Estimated Creatinine Clearance: 59.1 mL/min (by C-G formula based on SCr of 1.34 mg/dL (H)).  Sepsis Labs: @LABRCNTIP (procalcitonin:4,lacticidven:4) )No results found for this or any previous visit (from the past 240 hour(s)).   Radiological Exams on Admission: Ct Abdomen Pelvis W Contrast  Result Date: 10/30/2016 CLINICAL DATA:  Abdominal pain, vomiting for several days, history of Crohn's disease with prior partial colonic resection EXAM: CT ABDOMEN AND PELVIS WITH CONTRAST TECHNIQUE: Multidetector CT imaging of the abdomen and pelvis was performed using the standard protocol following bolus administration of intravenous contrast. CONTRAST:  1 ISOVUE-300 IOPAMIDOL (ISOVUE-300) INJECTION 61% COMPARISON:  CT abdomen pelvis of 01/15/2013 FINDINGS: Lower chest: There is hyper aeration at the lung bases. A calcified granuloma is noted anteriorly in the right middle lobe. However, there is a noncalcified soft tissue nodule within the left lower lobe on image number 4 measuring 12 mm. Previously this lesion measured 11 mm, and this lack of interval change suggests a benign process. Hepatobiliary: There does appear to be a small hiatal hernia present. The liver enhances with no focal abnormality. However, there is abnormality of the gallbladder which appears distended with gallbladder wall edema and probable pericholecystic fluid. These findings are highly suspicious for acute cholecystitis. No  calcified gallstones are seen. Pancreas: The pancreas is normal in size and the pancreatic duct is not dilated. Spleen: The spleen is unremarkable other than multiple calcified splenic granulomas consistent with prior granulomatous disease Adrenals/Urinary Tract: The adrenal glands are unremarkable. The kidneys enhance with no calculus or mass and there is no evidence of hydronephrosis. A cyst as emanate from the upper pole of the left kidney which is unchanged. On delayed images, the pelvocaliceal systems are unremarkable. Ureters are normal in caliber. The urinary bladder is moderately distended and is indented by the significantly enlarged prostate. Stomach/Bowel: The stomach is decompressed and cannot be evaluated. There  is some small bowel distension most likely due to mild ileus. No definite obstruction is seen. There is feces throughout the colon. Vascular/Lymphatic: The abdominal aorta is normal in caliber with moderate abdominal aortic atherosclerosis present. No adenopathy is seen. Reproductive: The prostate is significantly enlarged measuring 5.9 x 6.7 cm appearing somewhat inhomogeneous. The prostate does indent the posterior inferior urinary bladder in the midline. Other: None Musculoskeletal: The lumbar vertebrae are in normal alignment. There is degenerative disc disease at L5-S1. The SI joints appear fused in this patient with inflammatory bowel disease. IMPRESSION: 1. Marked distention of the gallbladder with gallbladder wall edema and pericholecystic fluid, findings most consistent with acute cholecystitis. There are surrounding inflammatory changes as well. No definite gallstones are seen. 2. Stable noncalcified nodule in the left lower lobe compared to the CT from 2014 suggesting a benign process. 3. Small hiatal hernia. 4. Multiple calcified splenic granulomas from prior granulomatous disease. 5. Significant Li enlarged prostate indenting the urinary bladder. 6. Moderate abdominal aortic  atherosclerosis. Critical Value/emergent results were called by telephone at the time of interpretation on 10/30/2016 at 1:35 pm to Dr. Gareth Morgan , who verbally acknowledged these results. Electronically Signed   By: Ivar Drape M.D.   On: 10/30/2016 13:35   US Abdomen Limited Ruq  Result Date: 10/30/2016 CLINICAL DATA:  Right upper quadrant pain over the last 6 weeks. EXAM: US ABDOMEN LIMITED - RIGHT UPPER QUADRANT COMPARISON:  CT 10/30/2016 FINDINGS: Gallbladder: Distended gallbladder with mild wall thickening and prominent pericholecystic edema. Partially shadowing filling defect within the gallbladder measuring 4 x 4 x 10 cm most consistent with tumefactive sludge. Common bile duct: Diameter: 5.7 mm, upper limits normal. Liver: Normal appearance of the liver. Trace free fluid adjacent to the right liver age. IMPRESSION: Findings most consistent with acute cholecystitis. The gallbladder is distended and shows pericholecystic fluid. Large tumefactive sludge ball within the gallbladder measuring up to 10 cm in diameter. Electronically Signed   By: Nelson Chimes M.D.   On: 10/30/2016 15:22    EKG:   Assessment/Plan Principal Problem:   Cholecystitis Active Problems:   Hypothyroidism   Anxiety state   COPD GOLD 0    Lung nodule   GERD   Abdominal pain   Nausea and vomiting   Acute kidney injury ([place])   Hiatal hernia   #1. Cholecystitis. CT of the abdomen reveals markedly distention of gallbladder with gallbladder wall edema and pericholecystic fluid consistent with acute cholecystitis. Also inflammatory changes no definite stones. He does have a leukocytosis and tachycardia is afebrile. He is provided with IV fluids Zosyn analgesia anti-emetics. [place] with gallbladder distention/fluid and sludge ball. -Admit to telemetry -Continue IV fluids -Bowel rest/nothing by mouth -Continue IV analgesia -Continue IV anti emetic -Continue Zosyn -Track lactic acid -Follow ultrasound ordered by  general surgery -Await general surgery input  #2. Tachycardia likely related to above. Afebrile. -Continue Zosyn -Follow blood cultures -Monitor  #3. Acute kidney injury. Likely related to above. Creatinine 1.3 on admission -IV fluids as noted above -Hold nephrotoxins -Monitor urine output -Recheck in the morning  #4. Hypertension. Fair control in the emergency department. Home medications includen amlodipine -Hold amlodipine for now - provide scheduled IV beta blocker with parameters -prn hydralazine -Resume home regimen when able -Monitor closely  #5. COPD. Appears stable at baseline. Oxygen saturation level >90%.  -continue home meds  #6. Lung nodule. Stable. Chart review indicates CT evaluated by pulmonary 9.2017. Continue surveilance per note  #7. Carcinoma of right  vocal cord. S/p radiation therapy.      DVT prophylaxis: scd and heparin Code Status: full Family Communication: wife and daughter at bedside  Disposition Plan: home  Consults called: general surgery  Admission status: inpatient    Radene Gunning MD [facility]  If 7PM-7AM, please contact night-coverage [email or web address] Password [email or web address]  10/30/2016, 3:25 PM

## 2016-10-30 NOTE — ED Triage Notes (Signed)
Pt reports to the ED for eval of RLQ abd pain and N/V since Tuesday. Pt denies any diarrhea. Today he developed some coffee ground emesis. Pt is not on any blood thinners or ASA. Pt denies any frequency NSAID use. Pain started in upper abd.

## 2016-10-31 ENCOUNTER — Encounter (HOSPITAL_COMMUNITY)
Admission: EM | Disposition: A | Discharge status: Home / Self Care | Payer: Self-pay | Source: Home / Self Care | Attending: Family Medicine

## 2016-10-31 ENCOUNTER — Inpatient Hospital Stay (HOSPITAL_COMMUNITY): Payer: Medicare Other | Admitting: Certified Registered"

## 2016-10-31 ENCOUNTER — Encounter (HOSPITAL_COMMUNITY): Payer: Self-pay | Admitting: Certified Registered"

## 2016-10-31 DIAGNOSIS — K219 Gastro-esophageal reflux disease without esophagitis: Secondary | ICD-10-CM

## 2016-10-31 DIAGNOSIS — K449 Diaphragmatic hernia without obstruction or gangrene: Secondary | ICD-10-CM

## 2016-10-31 HISTORY — PX: CHOLECYSTECTOMY: SHX55

## 2016-10-31 LAB — BASIC METABOLIC PANEL
ANION GAP: 10 (ref 5–15)
BUN: 31 mg/dL — ABNORMAL HIGH (ref 6–20)
CALCIUM: 8.3 mg/dL — AB (ref 8.9–10.3)
CO2: 26 mmol/L (ref 22–32)
Chloride: 97 mmol/L — ABNORMAL LOW (ref 101–111)
Creatinine, Ser: 1.07 mg/dL (ref 0.61–1.24)
GFR calc Af Amer: >60 mL/min (ref >60)
GLUCOSE: 113 mg/dL — AB (ref 65–99)
POTASSIUM: 3.7 mmol/L (ref 3.5–5.1)
SODIUM: 133 mmol/L — AB (ref 135–145)

## 2016-10-31 LAB — CBC
HEMATOCRIT: 38.5 % — AB (ref 39.0–52.0)
HEMOGLOBIN: 12.7 g/dL — AB (ref 13.0–17.0)
MCH: 30.1 pg (ref 26.0–34.0)
MCHC: 33 g/dL (ref 30.0–36.0)
MCV: 91.2 fL (ref 78.0–100.0)
Platelets: 234 10*3/uL (ref 150–400)
RBC: 4.22 MIL/uL (ref 4.22–5.81)
RDW: 14.2 % (ref 11.5–15.5)
WBC: 22.7 10*3/uL — AB (ref 4.0–10.5)

## 2016-10-31 LAB — PROTIME-INR
INR: 1.18
PROTHROMBIN TIME: 15.1 s (ref 11.4–15.2)

## 2016-10-31 LAB — SURGICAL PCR SCREEN
MRSA, PCR: NEGATIVE
Staphylococcus aureus: POSITIVE — AB

## 2016-10-31 SURGERY — LAPAROSCOPIC CHOLECYSTECTOMY
Anesthesia: General | Site: Abdomen

## 2016-10-31 MED ORDER — PROPOFOL 10 MG/ML IV BOLUS
INTRAVENOUS | Status: AC
  Filled 2016-10-31: qty 20

## 2016-10-31 MED ORDER — ACETAMINOPHEN 10 MG/ML IV SOLN
INTRAVENOUS | Status: AC
  Administered 2016-10-31: 18:00:00
  Filled 2016-10-31: qty 100

## 2016-10-31 MED ORDER — FENTANYL CITRATE (PF) 100 MCG/2ML IJ SOLN
INTRAMUSCULAR | Status: AC
  Filled 2016-10-31: qty 2

## 2016-10-31 MED ORDER — ROCURONIUM BROMIDE 10 MG/ML (PF) SYRINGE
PREFILLED_SYRINGE | INTRAVENOUS | Status: AC
  Filled 2016-10-31: qty 10

## 2016-10-31 MED ORDER — ONDANSETRON HCL 4 MG/2ML IJ SOLN
INTRAMUSCULAR | Status: AC
  Filled 2016-10-31: qty 2

## 2016-10-31 MED ORDER — HYDROMORPHONE HCL 1 MG/ML IJ SOLN
INTRAMUSCULAR | Status: AC
  Filled 2016-10-31: qty 0.5

## 2016-10-31 MED ORDER — LIDOCAINE 2% (20 MG/ML) 5 ML SYRINGE
INTRAMUSCULAR | Status: DC | PRN
  Administered 2016-10-31: 60 mg via INTRAVENOUS

## 2016-10-31 MED ORDER — SUCCINYLCHOLINE CHLORIDE 20 MG/ML IJ SOLN
INTRAMUSCULAR | Status: DC | PRN
  Administered 2016-10-31: 100 mg via INTRAVENOUS

## 2016-10-31 MED ORDER — ROCURONIUM BROMIDE 10 MG/ML (PF) SYRINGE
PREFILLED_SYRINGE | INTRAVENOUS | Status: DC | PRN
  Administered 2016-10-31: 10 mg via INTRAVENOUS
  Administered 2016-10-31: 40 mg via INTRAVENOUS
  Administered 2016-10-31: 10 mg via INTRAVENOUS

## 2016-10-31 MED ORDER — BUPIVACAINE-EPINEPHRINE (PF) 0.25% -1:200000 IJ SOLN
INTRAMUSCULAR | Status: AC
  Filled 2016-10-31: qty 30

## 2016-10-31 MED ORDER — LIDOCAINE 2% (20 MG/ML) 5 ML SYRINGE
INTRAMUSCULAR | Status: AC
  Filled 2016-10-31: qty 5

## 2016-10-31 MED ORDER — SUCCINYLCHOLINE CHLORIDE 200 MG/10ML IV SOSY
PREFILLED_SYRINGE | INTRAVENOUS | Status: AC
  Filled 2016-10-31: qty 10

## 2016-10-31 MED ORDER — FENTANYL CITRATE (PF) 100 MCG/2ML IJ SOLN
INTRAMUSCULAR | Status: AC
  Filled 2016-10-31: qty 4

## 2016-10-31 MED ORDER — 0.9 % SODIUM CHLORIDE (POUR BTL) OPTIME
TOPICAL | Status: DC | PRN
  Administered 2016-10-31 (×2): 1000 mL

## 2016-10-31 MED ORDER — AMLODIPINE BESYLATE 10 MG PO TABS
10.0000 mg | ORAL_TABLET | Freq: Every day | ORAL | Status: DC
  Administered 2016-10-31 – 2016-11-06 (×6): 10 mg via ORAL
  Filled 2016-10-31 (×7): qty 1

## 2016-10-31 MED ORDER — DEXAMETHASONE SODIUM PHOSPHATE 10 MG/ML IJ SOLN
INTRAMUSCULAR | Status: AC
  Filled 2016-10-31: qty 1

## 2016-10-31 MED ORDER — FENTANYL CITRATE (PF) 100 MCG/2ML IJ SOLN
INTRAMUSCULAR | Status: DC | PRN
  Administered 2016-10-31: 100 ug via INTRAVENOUS
  Administered 2016-10-31 (×4): 50 ug via INTRAVENOUS
  Administered 2016-10-31: 100 ug via INTRAVENOUS

## 2016-10-31 MED ORDER — DEXAMETHASONE SODIUM PHOSPHATE 10 MG/ML IJ SOLN
INTRAMUSCULAR | Status: DC | PRN
  Administered 2016-10-31: 4 mg via INTRAVENOUS

## 2016-10-31 MED ORDER — MIDAZOLAM HCL 5 MG/5ML IJ SOLN
INTRAMUSCULAR | Status: DC | PRN
  Administered 2016-10-31: 1 mg via INTRAVENOUS

## 2016-10-31 MED ORDER — PHENYLEPHRINE 40 MCG/ML (10ML) SYRINGE FOR IV PUSH (FOR BLOOD PRESSURE SUPPORT)
PREFILLED_SYRINGE | INTRAVENOUS | Status: DC | PRN
  Administered 2016-10-31 (×2): 40 ug via INTRAVENOUS
  Administered 2016-10-31 (×2): 80 ug via INTRAVENOUS

## 2016-10-31 MED ORDER — SUGAMMADEX SODIUM 200 MG/2ML IV SOLN
INTRAVENOUS | Status: AC
  Filled 2016-10-31: qty 2

## 2016-10-31 MED ORDER — HYDROMORPHONE HCL 2 MG/ML IJ SOLN
1.0000 mg | INTRAMUSCULAR | Status: DC | PRN
  Administered 2016-10-31 – 2016-11-01 (×7): 1 mg via INTRAVENOUS
  Filled 2016-10-31 (×7): qty 1

## 2016-10-31 MED ORDER — MIDAZOLAM HCL 2 MG/2ML IJ SOLN
INTRAMUSCULAR | Status: AC
  Filled 2016-10-31: qty 2

## 2016-10-31 MED ORDER — DULOXETINE HCL 30 MG PO CPEP
90.0000 mg | ORAL_CAPSULE | Freq: Every day | ORAL | Status: DC
  Administered 2016-11-01 – 2016-11-06 (×5): 90 mg via ORAL
  Filled 2016-10-31 (×6): qty 1

## 2016-10-31 MED ORDER — BUPIVACAINE-EPINEPHRINE 0.25% -1:200000 IJ SOLN
INTRAMUSCULAR | Status: DC | PRN
  Administered 2016-10-31: 4 mL

## 2016-10-31 MED ORDER — LACTATED RINGERS IV SOLN
INTRAVENOUS | Status: DC
  Administered 2016-10-31 (×2): via INTRAVENOUS

## 2016-10-31 MED ORDER — SODIUM CHLORIDE 0.9 % IR SOLN
Status: DC | PRN
  Administered 2016-10-31: 1000 mL
  Administered 2016-10-31 (×2): 3000 mL

## 2016-10-31 MED ORDER — ACETAMINOPHEN 10 MG/ML IV SOLN
1000.0000 mg | Freq: Four times a day (QID) | INTRAVENOUS | Status: AC
  Administered 2016-10-31 – 2016-11-01 (×4): 1000 mg via INTRAVENOUS
  Filled 2016-10-31 (×3): qty 100

## 2016-10-31 MED ORDER — SUGAMMADEX SODIUM 200 MG/2ML IV SOLN
INTRAVENOUS | Status: DC | PRN
  Administered 2016-10-31: 200 mg via INTRAVENOUS

## 2016-10-31 MED ORDER — PROPOFOL 10 MG/ML IV BOLUS
INTRAVENOUS | Status: DC | PRN
  Administered 2016-10-31: 130 mg via INTRAVENOUS

## 2016-10-31 MED ORDER — PHENYLEPHRINE 40 MCG/ML (10ML) SYRINGE FOR IV PUSH (FOR BLOOD PRESSURE SUPPORT)
PREFILLED_SYRINGE | INTRAVENOUS | Status: AC
  Filled 2016-10-31: qty 10

## 2016-10-31 MED ORDER — MUPIROCIN 2 % EX OINT
1.0000 "application " | TOPICAL_OINTMENT | Freq: Two times a day (BID) | CUTANEOUS | Status: AC
  Administered 2016-10-31 – 2016-11-04 (×10): 1 via NASAL
  Filled 2016-10-31 (×2): qty 22

## 2016-10-31 MED ORDER — CHLORHEXIDINE GLUCONATE CLOTH 2 % EX PADS
6.0000 | MEDICATED_PAD | Freq: Every day | CUTANEOUS | Status: AC
  Administered 2016-11-01 – 2016-11-04 (×4): 6 via TOPICAL

## 2016-10-31 MED ORDER — HEMOSTATIC AGENTS (NO CHARGE) OPTIME
TOPICAL | Status: DC | PRN
  Administered 2016-10-31 (×3): 1 via TOPICAL

## 2016-10-31 MED ORDER — HYDROMORPHONE HCL 1 MG/ML IJ SOLN
0.2500 mg | INTRAMUSCULAR | Status: DC | PRN
  Administered 2016-10-31 (×3): 0.5 mg via INTRAVENOUS

## 2016-10-31 MED ORDER — ONDANSETRON HCL 4 MG/2ML IJ SOLN
INTRAMUSCULAR | Status: DC | PRN
  Administered 2016-10-31: 4 mg via INTRAVENOUS

## 2016-10-31 MED ORDER — LEVOTHYROXINE SODIUM 75 MCG PO TABS
150.0000 ug | ORAL_TABLET | Freq: Every day | ORAL | Status: DC
  Administered 2016-11-01 – 2016-11-06 (×6): 150 ug via ORAL
  Filled 2016-10-31 (×6): qty 2

## 2016-10-31 SURGICAL SUPPLY — 62 items
APPLIER CLIP 13 LRG OPEN (CLIP) ×3
APPLIER CLIP 5 13 M/L LIGAMAX5 (MISCELLANEOUS) ×3
BLADE SURG 10 STRL SS (BLADE) ×3 IMPLANT
BLADE SURG ROTATE 9660 (MISCELLANEOUS) ×3 IMPLANT
CANISTER SUCTION 2500CC (MISCELLANEOUS) ×3 IMPLANT
CHLORAPREP W/TINT 26ML (MISCELLANEOUS) ×3 IMPLANT
CLIP APPLIE 13 LRG OPEN (CLIP) ×1 IMPLANT
CLIP APPLIE 5 13 M/L LIGAMAX5 (MISCELLANEOUS) ×1 IMPLANT
CLIP TI MEDIUM 6 (CLIP) ×3 IMPLANT
CLOSURE WOUND 1/2 X4 (GAUZE/BANDAGES/DRESSINGS) ×1
COVER SURGICAL LIGHT HANDLE (MISCELLANEOUS) ×3 IMPLANT
DERMABOND ADVANCED (GAUZE/BANDAGES/DRESSINGS) ×2
DERMABOND ADVANCED .7 DNX12 (GAUZE/BANDAGES/DRESSINGS) ×1 IMPLANT
DEVICE TROCAR PUNCTURE CLOSURE (ENDOMECHANICALS) ×3 IMPLANT
DRAIN CHANNEL 19F RND (DRAIN) ×3 IMPLANT
ELECT BLADE 6.5 EXT (BLADE) ×3 IMPLANT
ELECT CAUTERY BLADE 6.4 (BLADE) ×3 IMPLANT
ELECT REM PT RETURN 9FT ADLT (ELECTROSURGICAL) ×3
ELECTRODE REM PT RTRN 9FT ADLT (ELECTROSURGICAL) ×1 IMPLANT
EVACUATOR SILICONE 100CC (DRAIN) ×3 IMPLANT
GAUZE SPONGE 4X4 12PLY STRL (GAUZE/BANDAGES/DRESSINGS) ×3 IMPLANT
GLOVE BIO SURGEON STRL SZ7 (GLOVE) ×3 IMPLANT
GLOVE BIO SURGEON STRL SZ7.5 (GLOVE) ×3 IMPLANT
GLOVE BIOGEL PI IND STRL 7.0 (GLOVE) ×1 IMPLANT
GLOVE BIOGEL PI IND STRL 7.5 (GLOVE) ×3 IMPLANT
GLOVE BIOGEL PI INDICATOR 7.0 (GLOVE) ×2
GLOVE BIOGEL PI INDICATOR 7.5 (GLOVE) ×6
GLOVE SURG SS PI 6.5 STRL IVOR (GLOVE) ×3 IMPLANT
GOWN STRL REUS W/ TWL LRG LVL3 (GOWN DISPOSABLE) ×4 IMPLANT
GOWN STRL REUS W/TWL LRG LVL3 (GOWN DISPOSABLE) ×8
HEMOSTAT SNOW SURGICEL 2X4 (HEMOSTASIS) ×9 IMPLANT
KIT BASIN OR (CUSTOM PROCEDURE TRAY) ×3 IMPLANT
KIT ROOM TURNOVER OR (KITS) ×3 IMPLANT
NS IRRIG 1000ML POUR BTL (IV SOLUTION) ×3 IMPLANT
PAD ARMBOARD 7.5X6 YLW CONV (MISCELLANEOUS) ×3 IMPLANT
PENCIL BUTTON HOLSTER BLD 10FT (ELECTRODE) ×3 IMPLANT
POUCH RETRIEVAL ECOSAC 10 (ENDOMECHANICALS) ×1 IMPLANT
POUCH RETRIEVAL ECOSAC 10MM (ENDOMECHANICALS) ×2
SCISSORS LAP 5X35 DISP (ENDOMECHANICALS) ×3 IMPLANT
SET IRRIG TUBING LAPAROSCOPIC (IRRIGATION / IRRIGATOR) ×3 IMPLANT
SLEEVE ENDOPATH XCEL 5M (ENDOMECHANICALS) ×6 IMPLANT
SPECIMEN JAR SMALL (MISCELLANEOUS) ×3 IMPLANT
SPONGE LAP 18X18 X RAY DECT (DISPOSABLE) ×9 IMPLANT
STRIP CLOSURE SKIN 1/2X4 (GAUZE/BANDAGES/DRESSINGS) ×2 IMPLANT
SUT ETHILON 2 0 FS 18 (SUTURE) ×3 IMPLANT
SUT MNCRL AB 4-0 PS2 18 (SUTURE) ×3 IMPLANT
SUT PDS AB 1 TP1 54 (SUTURE) ×6 IMPLANT
SUT VIC AB 2-0 CT1 27 (SUTURE) ×2
SUT VIC AB 2-0 CT1 TAPERPNT 27 (SUTURE) ×1 IMPLANT
SUT VICRYL 0 UR6 27IN ABS (SUTURE) ×3 IMPLANT
TAPE CLOTH SURG 6X10 WHT LF (GAUZE/BANDAGES/DRESSINGS) ×3 IMPLANT
TOWEL OR 17X26 10 PK STRL BLUE (TOWEL DISPOSABLE) ×3 IMPLANT
TRAY FOLEY W/METER SILVER 16FR (SET/KITS/TRAYS/PACK) ×3 IMPLANT
TRAY LAPAROSCOPIC MC (CUSTOM PROCEDURE TRAY) ×3 IMPLANT
TROCAR ADV FIXATION 5X100MM (TROCAR) ×3 IMPLANT
TROCAR BLADELESS 11MM (ENDOMECHANICALS) ×3 IMPLANT
TROCAR XCEL BLUNT TIP 100MML (ENDOMECHANICALS) ×3 IMPLANT
TROCAR XCEL NON-BLD 5MMX100MML (ENDOMECHANICALS) ×3 IMPLANT
TUBE CONNECTING 12'X1/4 (SUCTIONS) ×1
TUBE CONNECTING 12X1/4 (SUCTIONS) ×2 IMPLANT
TUBING INSUFFLATION (TUBING) ×3 IMPLANT
YANKAUER SUCT BULB TIP NO VENT (SUCTIONS) ×3 IMPLANT

## 2016-10-31 NOTE — Anesthesia Preprocedure Evaluation (Addendum)
Anesthesia Evaluation  Patient identified by MRN, date of birth, ID band Patient awake    Reviewed: Allergy & Precautions, H&P , NPO status , Patient's Chart, lab work & pertinent test results  Airway Mallampati: II  TM Distance: >3 FB Neck ROM: Full    Dental no notable dental hx. (+) Upper Dentures, Lower Dentures, Dental Advisory Given   Pulmonary COPD, former smoker,    Pulmonary exam normal breath sounds clear to auscultation       Cardiovascular hypertension, Pt. on medications  Rhythm:Regular Rate:Normal     Neuro/Psych Anxiety Depression negative neurological ROS  negative psych ROS   GI/Hepatic Neg liver ROS, GERD  ,  Endo/Other  Hypothyroidism   Renal/GU negative Renal ROS  negative genitourinary   Musculoskeletal  (+) Arthritis , Osteoarthritis,    Abdominal   Peds  Hematology negative hematology ROS (+)   Anesthesia Other Findings   Reproductive/Obstetrics negative OB ROS                            Anesthesia Physical Anesthesia Plan  ASA: II  Anesthesia Plan: General   Post-op Pain Management:    Induction: Intravenous, Rapid sequence and Cricoid pressure planned  Airway Management Planned: Oral ETT  Additional Equipment:   Intra-op Plan:   Post-operative Plan: Extubation in OR  Informed Consent: I have reviewed the patients History and Physical, chart, labs and discussed the procedure including the risks, benefits and alternatives for the proposed anesthesia with the patient or authorized representative who has indicated his/her understanding and acceptance.   Dental advisory given  Plan Discussed with: CRNA  Anesthesia Plan Comments:        Anesthesia Quick Evaluation

## 2016-10-31 NOTE — Plan of Care (Signed)
Problem: Bowel/Gastric: Goal: Will not experience complications related to bowel motility Outcome: Progressing Medicated twice for abdominal pain with moderate relief

## 2016-10-31 NOTE — Progress Notes (Signed)
PROGRESS NOTE    [name]  [id number] DOB: 31-Oct-1946 DOA: 10/30/2016 PCP: Reginia Naas, MD    Brief Narrative:  [name] is a 70 y.o. 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 Initial evaluation includes CT of the abdomen concerning for cholecystitis leukocytosis acute kidney injury  Information is obtained from the patient and his wife and daughter who are at the bedside. He reports 3 day history of abdominal pain. States the pain located in the epigastric area initially and then spread to the right upper and lower abdomen. Describes as constant aching-like pain. Worse on the right upper and lower abdomen. Associated symptoms include nausea and vomiting. Reports 10 episodes emesis over last 2 days. One episode coffee ground emesis this am. Reports trying to drink water over the last 2 days but this may be abdominal pain worse and he would vomit shortly thereafter. He denies fever chills headache dizziness syncope or near-syncope. He denies dysuria hematuria frequency or urgency. He denies diarrhea constipation melena bright red blood per rectum. He denies chest pain palpitation shortness of breath.    Assessment & Plan:   Principal Problem:   Cholecystitis Active Problems:   Hypothyroidism   Anxiety state   COPD GOLD 0    Lung nodule   GERD   Abdominal pain   Nausea and vomiting   Acute kidney injury ([place])   Hiatal hernia   Cholecystitis - CT of the abdomen reveals markedly distention of gallbladder with gallbladder wall edema and pericholecystic fluid consistent with acute cholecystitis. Also inflammatory changes no definite stones. He does have a leukocytosis and tachycardia is afebrile. He is provided with IV fluids Zosyn analgesia  anti-emetics. [place] with gallbladder distention/fluid and sludge ball. -Admit to telemetry -Continue IV fluids - Bowel rest/nothing by mouth except ice chips -Continue IV analgesia -Continue IV anti emetic -Continue Zosyn -patient underwent cholecystectomy today  Tachycardia likely related to above. Afebrile. -Continue Zosyn -Follow blood cultures -Monitor  Acute kidney injury. Likely related to above.  -Creatinine 1.3 on admission -IV fluids as noted above -Hold nephrotoxins -Monitor urine output -Recheck BMP in am  Hypertension. Fair control in the emergency department. Home medications includen amlodipine -Hold amlodipine for now - provide scheduled IV beta blocker with parameters -prn hydralazine -Resume home regimen when able (when able to take PO medications) -Monitor closely  COPD. Appears stable at baseline. Oxygen saturation level >90%.  -continue home meds  Lung nodule. Stable. Chart review indicates CT evaluated by pulmonary 9.2017. Continue surveilance per note  Carcinoma of right vocal cord. S/p radiation therapy.      DVT prophylaxis: scd and heparin Code Status: full Family Communication: wife and daughter at bedside  Disposition Plan: home   Consultants:   General Surgery  Procedures:   Cholecystectomy  Antimicrobials:   Zosyn    Subjective: Patient seen this morning prior to procedure.  Still having significant abdominal pain.  Voices that he is hopeful he will go to surgery soon as he is uncomfortable.  Family bedside- no questions.  Objective: Vitals:   10/31/16 1445 10/31/16 1453 10/31/16 1500 10/31/16 1505  BP:  (!) 150/82    Pulse: 80 80 85 80  Resp: 14 14 17 16   Temp:   98.1 F (36.7 C)   TempSrc:      SpO2: 99% 99% 99%  99%  Weight:      Height:        Intake/Output Summary (Last 24 hours) at 10/31/16 1729 Last data filed at 10/31/16 1509  Gross per 24 hour  Intake             1100 ml  Output             1975  ml  Net             -875 ml   Filed Weights   10/30/16 1130 10/31/16 0600  Weight: 83.9 kg (185 lb) (!) 174.6 kg (384 lb 14.8 oz)    Examination:  General exam: mild distress  Respiratory system: Clear to auscultation. Respiratory effort normal. Cardiovascular system: S1 & S2 heard, RRR. No JVD, murmurs, rubs, gallops or clicks. No pedal edema. Gastrointestinal system: Abdomen is nondistended, soft and tender in the right upper quadrant. No organomegaly or masses felt. Normal bowel sounds heard. Central nervous system: Alert and oriented. No focal neurological deficits. Extremities: Symmetric 5 x 5 power. Skin: No rashes, lesions or ulcers Psychiatry: Judgement and insight appear normal. Mood & affect appropriate.     Data Reviewed: I have personally reviewed following labs and imaging studies  CBC:  Recent Labs Lab 10/30/16 1051 10/31/16 0512  WBC 30.7* 22.7*  HGB 15.8 12.7*  HCT 46.1 38.5*  MCV 90.9 91.2  PLT 251 [id number]   Basic Metabolic Panel:  Recent Labs Lab 10/30/16 1051 10/31/16 0512  NA 132* 133*  K 4.3 3.7  CL 94* 97*  CO2 26 26  GLUCOSE 127* 113*  BUN 30* 31*  CREATININE 1.34* 1.07  CALCIUM 9.3 8.3*   GFR: Estimated Creatinine Clearance: 106.4 mL/min (by C-G formula based on SCr of 1.07 mg/dL). Liver Function Tests:  Recent Labs Lab 10/30/16 1051  AST 25  ALT 26  ALKPHOS 23*  BILITOT 1.1  PROT 7.9  ALBUMIN 4.0    Recent Labs Lab 10/30/16 1051  LIPASE 17   No results for input(s): AMMONIA in the last 168 hours. Coagulation Profile:  Recent Labs Lab 10/31/16 0512  INR 1.18   Cardiac Enzymes: No results for input(s): CKTOTAL, CKMB, CKMBINDEX, TROPONINI in the last 168 hours. BNP (last 3 results) No results for input(s): PROBNP in the last 8760 hours. HbA1C: No results for input(s): HGBA1C in the last 72 hours. CBG: No results for input(s): GLUCAP in the last 168 hours. Lipid Profile: No results for input(s): CHOL, HDL,  LDLCALC, TRIG, CHOLHDL, LDLDIRECT in the last 72 hours. Thyroid Function Tests:  Recent Labs  10/30/16 1605  TSH 2.277   Anemia Panel: No results for input(s): VITAMINB12, FOLATE, FERRITIN, TIBC, IRON, RETICCTPCT in the last 72 hours. Sepsis Labs:  Recent Labs Lab 10/30/16 1604 10/30/16 2009  LATICACIDVEN 2.0* 2.2*    Recent Results (from the past 240 hour(s))  Blood culture (routine x 2)     Status: None (Preliminary result)   Collection Time: 10/30/16 12:49 PM  Result Value Ref Range Status   Specimen Description BLOOD RIGHT ANTECUBITAL  Final   Special Requests BOTTLES DRAWN AEROBIC AND ANAEROBIC 5 CC  Final   Culture NO GROWTH < 24 HOURS  Final   Report Status PENDING  Incomplete  Blood culture (routine x 2)     Status: None (Preliminary result)   Collection Time: 10/30/16 12:49 PM  Result Value Ref Range Status   Specimen Description BLOOD RIGHT HAND  Final   Special Requests BOTTLES DRAWN AEROBIC AND  ANAEROBIC 5 CC  Final   Culture NO GROWTH < 24 HOURS  Final   Report Status PENDING  Incomplete  Surgical PCR screen     Status: Abnormal   Collection Time: 10/30/16 11:47 PM  Result Value Ref Range Status   MRSA, PCR NEGATIVE NEGATIVE Final   Staphylococcus aureus POSITIVE (A) NEGATIVE Final    Comment:        The Xpert SA Assay (FDA approved for NASAL specimens in patients over 60 years of age), is one component of a comprehensive surveillance program.  Test performance has been validated by [facility] for patients greater than or equal to 77 year old. It is not intended to diagnose infection nor to guide or monitor treatment.          Radiology Studies: Ct Abdomen Pelvis W Contrast  Result Date: 10/30/2016 CLINICAL DATA:  Abdominal pain, vomiting for several days, history of Crohn's disease with prior partial colonic resection EXAM: CT ABDOMEN AND PELVIS WITH CONTRAST TECHNIQUE: Multidetector CT imaging of the abdomen and pelvis was performed using  the standard protocol following bolus administration of intravenous contrast. CONTRAST:  1 ISOVUE-300 IOPAMIDOL (ISOVUE-300) INJECTION 61% COMPARISON:  CT abdomen pelvis of 01/15/2013 FINDINGS: Lower chest: There is hyper aeration at the lung bases. A calcified granuloma is noted anteriorly in the right middle lobe. However, there is a noncalcified soft tissue nodule within the left lower lobe on image number 4 measuring 12 mm. Previously this lesion measured 11 mm, and this lack of interval change suggests a benign process. Hepatobiliary: There does appear to be a small hiatal hernia present. The liver enhances with no focal abnormality. However, there is abnormality of the gallbladder which appears distended with gallbladder wall edema and probable pericholecystic fluid. These findings are highly suspicious for acute cholecystitis. No calcified gallstones are seen. Pancreas: The pancreas is normal in size and the pancreatic duct is not dilated. Spleen: The spleen is unremarkable other than multiple calcified splenic granulomas consistent with prior granulomatous disease Adrenals/Urinary Tract: The adrenal glands are unremarkable. The kidneys enhance with no calculus or mass and there is no evidence of hydronephrosis. A cyst as emanate from the upper pole of the left kidney which is unchanged. On delayed images, the pelvocaliceal systems are unremarkable. Ureters are normal in caliber. The urinary bladder is moderately distended and is indented by the significantly enlarged prostate. Stomach/Bowel: The stomach is decompressed and cannot be evaluated. There is some small bowel distension most likely due to mild ileus. No definite obstruction is seen. There is feces throughout the colon. Vascular/Lymphatic: The abdominal aorta is normal in caliber with moderate abdominal aortic atherosclerosis present. No adenopathy is seen. Reproductive: The prostate is significantly enlarged measuring 5.9 x 6.7 cm appearing  somewhat inhomogeneous. The prostate does indent the posterior inferior urinary bladder in the midline. Other: None Musculoskeletal: The lumbar vertebrae are in normal alignment. There is degenerative disc disease at L5-S1. The SI joints appear fused in this patient with inflammatory bowel disease. IMPRESSION: 1. Marked distention of the gallbladder with gallbladder wall edema and pericholecystic fluid, findings most consistent with acute cholecystitis. There are surrounding inflammatory changes as well. No definite gallstones are seen. 2. Stable noncalcified nodule in the left lower lobe compared to the CT from 2014 suggesting a benign process. 3. Small hiatal hernia. 4. Multiple calcified splenic granulomas from prior granulomatous disease. 5. Significant Li enlarged prostate indenting the urinary bladder. 6. Moderate abdominal aortic atherosclerosis. Critical Value/emergent results were called by  telephone at the time of interpretation on 10/30/2016 at 1:35 pm to Dr. Gareth Morgan , who verbally acknowledged these results. Electronically Signed   By: Ivar Drape M.D.   On: 10/30/2016 13:35   Dg Chest Port 1 View  Result Date: 10/30/2016 CLINICAL DATA:  Preoperative examination prior to abdominal surgery. EXAM: PORTABLE CHEST 1 VIEW COMPARISON:  10/16/2015 and prior exams FINDINGS: The cardiomediastinal silhouette is unremarkable. There is no evidence of focal airspace disease, pulmonary edema, suspicious pulmonary nodule/mass, pleural effusion, or pneumothorax. No acute bony abnormalities are identified. IMPRESSION: No active disease. Electronically Signed   By: Margarette Canada M.D.   On: 10/30/2016 16:45   US Abdomen Limited Ruq  Result Date: 10/30/2016 CLINICAL DATA:  Right upper quadrant pain over the last 6 weeks. EXAM: US ABDOMEN LIMITED - RIGHT UPPER QUADRANT COMPARISON:  CT 10/30/2016 FINDINGS: Gallbladder: Distended gallbladder with mild wall thickening and prominent pericholecystic edema.  Partially shadowing filling defect within the gallbladder measuring 4 x 4 x 10 cm most consistent with tumefactive sludge. Common bile duct: Diameter: 5.7 mm, upper limits normal. Liver: Normal appearance of the liver. Trace free fluid adjacent to the right liver age. IMPRESSION: Findings most consistent with acute cholecystitis. The gallbladder is distended and shows pericholecystic fluid. Large tumefactive sludge ball within the gallbladder measuring up to 10 cm in diameter. Electronically Signed   By: Nelson Chimes M.D.   On: 10/30/2016 15:22        Scheduled Meds: . acetaminophen      . acetaminophen  1,000 mg Intravenous Q6H  . amLODipine  10 mg Oral Daily  . Chlorhexidine Gluconate Cloth  6 each Topical Daily  . DULoxetine  90 mg Oral Daily  . HYDROmorphone      . HYDROmorphone      . HYDROmorphone      . [START ON 11/01/2016] levothyroxine  150 mcg Oral QAC breakfast  . metoprolol  2.5 mg Intravenous Q6H  . mometasone-formoterol  2 puff Inhalation BID  . mupirocin ointment  1 application Nasal BID  . piperacillin-tazobactam (ZOSYN)  IV  3.375 g Intravenous Q8H   Continuous Infusions: . lactated ringers 10 mL/hr at 10/31/16 1043     LOS: 1 day    Time spent: 35 minutes    Loretha Stapler, MD [facility] Pager [phone]  If 7PM-7AM, please contact night-coverage [email or web address] Password [email or web address] 10/31/2016, 5:29 PM

## 2016-10-31 NOTE — Op Note (Signed)
Preoperative diagnosis: Acute cholecystitis Postoperative diagnosis: Gangrenous cholecystitis Procedure: Laparoscopic lysis of adhesions, laparoscopic converted to open subtotal cholecystectomy, wedge biopsy of liver lesion Surgeon: Dr. Serita Grammes Asst.: Sharyn Dross Anesthesia: Gen. Drains: 35 French Blake drain to right upper quadrant Complications: None Estimated blood loss: 100 mL Specimens: Gallbladder, wedge biopsy of liver Sponge needle count was correct at completion Description to recovery stable  Indications: This is a 70 year old male who has acute cholecystitis with a elevated white blood cell count and a very distended gallbladder with the appearance of a 10 cm area of sludge in his gallbladder. I did not think drain placement would be adequate for this so I discussed with him a laparoscopic possible open cholecystectomy. We also discussed the possibilities of total cholecystectomy prior to beginning.  Procedure: After informed was obtained patient was taken to the operating room. He was already on antibiotics. SCDs were in place. He was placed under general anesthesia without complication. His abdomen was prepped and draped in standard sterile surgical fashion. Surgical timeout was then performed.  Due to his prior midline incision I made a incision the left upper quadrant. I incised the fascia and entered the peritoneum bluntly. I inserted a balloon-tip trocar and insufflated the abdomen to 15 mmHg pressure. He had adhesions to his midline that were mostly omental. I inserted an additional 5 mm trocar in the left lower quadrant and took these adhesions down from the abdominal wall using a combination of blunt and sharp dissection. His gallbladder was able to be identified. This was almost down into his pelvis. I was able to peel off the rind around it from the omentum. I was not able to grasp it so I put 3 additional trocars in. One of these was an 11 mm epigastric and the other  2 were 5 mm trocars in the right side. I then aspirated the gallbladder and there was some bile that came out. It was still very difficult to grasp as the gallbladder still had a lot of contents present as well as a very thick rind. I attempted to retract this cephalad. I was really unable to get a good view and able to do this safely. While I tried to enter into the rind to find the gallbladder to retract it at this proved to me that the gallbladder was gangrenous. The entire gallbladder wall was dead. I elected at that point to make a right upper quadrant incision. I then made a right upper quadrant incision. I carried this through the muscles. I entered into the peritoneum without difficulty. The gallbladder was then grasped. Cautery was used to some difficulties removed from the liver. Once I got down in the triangle area I was unable to really confidently identify any structures. This was appeared to be fused to the common bile duct with a lot of inflammation. I then evacuated all the contents of the gallbladder. There was a lot of very thick sludge that was present that would not have become out with a drain. I then evacuated all of the stones. I then removed the most of the gallbladder and remove this with a cuff of gallbladder that remain on the common bile duct. This was passed off the table as a specimen. I then oversewed the remnant gallbladder with 2-0 Vicryl suture. I cauterized the liver. Surgicel was placed on the liver. I place a 96 [place] Blake drain to the right upper quadrant. Copious irrigation was performed. I got all of the stones I could  find out of the abdomen. I then noted 2 small white nodules on the liver. I used cautery to do a wedge biopsy removed a small portion of the liver and sent this to pathology as well. I then closed the peritoneum with 2-0 Vicryl. The fascia was closed with #1 PDS. I then closed the midline incision from my laparoscopic attempt with a 2-0 Vicryl. I stapled the  incisions. He tolerated this well as expected and transferred to recovery stable.

## 2016-10-31 NOTE — Progress Notes (Addendum)
Pt arrived alert and orientated to room in pain 1mg  of dil. Given to pt with little relief.

## 2016-10-31 NOTE — Progress Notes (Signed)
Subjective: ruq pain  Objective: Vital signs in last 24 hours: Temp:  [97.7 F (36.5 C)-98.6 F (37 C)] 97.7 F (36.5 C) (12/01 0600) Pulse Rate:  [83-110] 83 (12/01 0600) Resp:  [13-26] 18 (11/30 2000) BP: (99-148)/(61-93) 142/78 (12/01 0600) SpO2:  [94 %-100 %] 96 % (11/30 2000) Weight:  [83.9 kg (185 lb)-174.6 kg (384 lb 14.8 oz)] 174.6 kg (384 lb 14.8 oz) (12/01 0600) Last BM Date: 10/29/16  Intake/Output from previous day: 11/30 0701 - 12/01 0700 In: -  Out: 1150 [Urine:1150] Intake/Output this shift: No intake/output data recorded.  GI: murphys sign tender ruq  Lab Results:   Recent Labs  10/30/16 1051 10/31/16 0512  WBC 30.7* 22.7*  HGB 15.8 12.7*  HCT 46.1 38.5*  PLT 251 234   BMET  Recent Labs  10/30/16 1051 10/31/16 0512  NA 132* 133*  K 4.3 3.7  CL 94* 97*  CO2 26 26  GLUCOSE 127* 113*  BUN 30* 31*  CREATININE 1.34* 1.07  CALCIUM 9.3 8.3*   PT/INR  Recent Labs  10/31/16 0512  LABPROT 15.1  INR 1.18   ABG No results for input(s): PHART, HCO3 in the last 72 hours.  Invalid input(s): PCO2, PO2  Studies/Results: Ct Abdomen Pelvis W Contrast  Result Date: 10/30/2016 CLINICAL DATA:  Abdominal pain, vomiting for several days, history of Crohn's disease with prior partial colonic resection EXAM: CT ABDOMEN AND PELVIS WITH CONTRAST TECHNIQUE: Multidetector CT imaging of the abdomen and pelvis was performed using the standard protocol following bolus administration of intravenous contrast. CONTRAST:  1 ISOVUE-300 IOPAMIDOL (ISOVUE-300) INJECTION 61% COMPARISON:  CT abdomen pelvis of 01/15/2013 FINDINGS: Lower chest: There is hyper aeration at the lung bases. A calcified granuloma is noted anteriorly in the right middle lobe. However, there is a noncalcified soft tissue nodule within the left lower lobe on image number 4 measuring 12 mm. Previously this lesion measured 11 mm, and this lack of interval change suggests a benign process.  Hepatobiliary: There does appear to be a small hiatal hernia present. The liver enhances with no focal abnormality. However, there is abnormality of the gallbladder which appears distended with gallbladder wall edema and probable pericholecystic fluid. These findings are highly suspicious for acute cholecystitis. No calcified gallstones are seen. Pancreas: The pancreas is normal in size and the pancreatic duct is not dilated. Spleen: The spleen is unremarkable other than multiple calcified splenic granulomas consistent with prior granulomatous disease Adrenals/Urinary Tract: The adrenal glands are unremarkable. The kidneys enhance with no calculus or mass and there is no evidence of hydronephrosis. A cyst as emanate from the upper pole of the left kidney which is unchanged. On delayed images, the pelvocaliceal systems are unremarkable. Ureters are normal in caliber. The urinary bladder is moderately distended and is indented by the significantly enlarged prostate. Stomach/Bowel: The stomach is decompressed and cannot be evaluated. There is some small bowel distension most likely due to mild ileus. No definite obstruction is seen. There is feces throughout the colon. Vascular/Lymphatic: The abdominal aorta is normal in caliber with moderate abdominal aortic atherosclerosis present. No adenopathy is seen. Reproductive: The prostate is significantly enlarged measuring 5.9 x 6.7 cm appearing somewhat inhomogeneous. The prostate does indent the posterior inferior urinary bladder in the midline. Other: None Musculoskeletal: The lumbar vertebrae are in normal alignment. There is degenerative disc disease at L5-S1. The SI joints appear fused in this patient with inflammatory bowel disease. IMPRESSION: 1. Marked distention of the gallbladder with gallbladder wall edema  and pericholecystic fluid, findings most consistent with acute cholecystitis. There are surrounding inflammatory changes as well. No definite gallstones are  seen. 2. Stable noncalcified nodule in the left lower lobe compared to the CT from 2014 suggesting a benign process. 3. Small hiatal hernia. 4. Multiple calcified splenic granulomas from prior granulomatous disease. 5. Significant Li enlarged prostate indenting the urinary bladder. 6. Moderate abdominal aortic atherosclerosis. Critical Value/emergent results were called by telephone at the time of interpretation on 10/30/2016 at 1:35 pm to Dr. Gareth Morgan , who verbally acknowledged these results. Electronically Signed   By: Ivar Drape M.D.   On: 10/30/2016 13:35   Dg Chest Port 1 View  Result Date: 10/30/2016 CLINICAL DATA:  Preoperative examination prior to abdominal surgery. EXAM: PORTABLE CHEST 1 VIEW COMPARISON:  10/16/2015 and prior exams FINDINGS: The cardiomediastinal silhouette is unremarkable. There is no evidence of focal airspace disease, pulmonary edema, suspicious pulmonary nodule/mass, pleural effusion, or pneumothorax. No acute bony abnormalities are identified. IMPRESSION: No active disease. Electronically Signed   By: Margarette Canada M.D.   On: 10/30/2016 16:45   US Abdomen Limited Ruq  Result Date: 10/30/2016 CLINICAL DATA:  Right upper quadrant pain over the last 6 weeks. EXAM: US ABDOMEN LIMITED - RIGHT UPPER QUADRANT COMPARISON:  CT 10/30/2016 FINDINGS: Gallbladder: Distended gallbladder with mild wall thickening and prominent pericholecystic edema. Partially shadowing filling defect within the gallbladder measuring 4 x 4 x 10 cm most consistent with tumefactive sludge. Common bile duct: Diameter: 5.7 mm, upper limits normal. Liver: Normal appearance of the liver. Trace free fluid adjacent to the right liver age. IMPRESSION: Findings most consistent with acute cholecystitis. The gallbladder is distended and shows pericholecystic fluid. Large tumefactive sludge ball within the gallbladder measuring up to 10 cm in diameter. Electronically Signed   By: Nelson Chimes M.D.   On: 10/30/2016  15:22    Anti-infectives: Anti-infectives    Start     Dose/Rate Route Frequency Ordered Stop   10/30/16 1800  piperacillin-tazobactam (ZOSYN) IVPB 3.375 g     3.375 g 12.5 mL/hr over 240 Minutes Intravenous Every 8 hours 10/30/16 1514     10/30/16 1145  piperacillin-tazobactam (ZOSYN) IVPB 3.375 g     3.375 g 100 mL/hr over 30 Minutes Intravenous  Once 10/30/16 1138 10/30/16 1359      Assessment/Plan: Cholecystitis  I discussed again the severity of his gb disease. I think he needs surgery and not drain placement.  He is ill.  I discussed laparoscopic chole, lap subtotal chole and open chole with them. We discussed advantages and disadvantages of each.  Will proceed today to surgery. Risks discussed again with patient and his family  [facility] 10/31/2016

## 2016-10-31 NOTE — Anesthesia Procedure Notes (Signed)
Procedure Name: Intubation Date/Time: 10/31/2016 11:08 AM Performed by: Melina Copa, Kharlie Bring R Pre-anesthesia Checklist: Patient identified, Emergency Drugs available, Suction available and Patient being monitored Patient Re-evaluated:Patient Re-evaluated prior to inductionOxygen Delivery Method: Circle System Utilized Preoxygenation: Pre-oxygenation with 100% oxygen Intubation Type: IV induction Ventilation: Mask ventilation without difficulty Laryngoscope Size: Mac and 4 Grade View: Grade I Tube type: Oral Tube size: 8.0 mm Number of attempts: 1 Airway Equipment and Method: Stylet and Oral airway Placement Confirmation: ETT inserted through vocal cords under direct vision,  positive ETCO2 and breath sounds checked- equal and bilateral Secured at: 23 cm Tube secured with: Tape Dental Injury: Teeth and Oropharynx as per pre-operative assessment

## 2016-10-31 NOTE — Progress Notes (Signed)
Report called to short stay as ordered

## 2016-10-31 NOTE — Transfer of Care (Signed)
Immediate Anesthesia Transfer of Care Note  Patient: [name]  Procedure(s) Performed: Procedure(s): LAPAROSCOPIC CHOLECYSTECTOMY , POSSIBLE OPEN (N/A)  Patient Location: PACU  Anesthesia Type:General  Level of Consciousness: awake, oriented and patient cooperative  Airway & Oxygen Therapy: Patient Spontanous Breathing and Patient connected to nasal cannula oxygen  Post-op Assessment: Report given to RN, Post -op Vital signs reviewed and stable and Patient moving all extremities  Post vital signs: Reviewed and stable  Last Vitals:  Vitals:   10/30/16 2000 10/31/16 0600  BP: 132/79 (!) 142/78  Pulse: 89 83  Resp: 18   Temp: 37 C 36.5 C    Last Pain:  Vitals:   10/31/16 0820  TempSrc:   PainSc: 7       Patients Stated Pain Goal: 0 ([id number])  Complications: No apparent anesthesia complications

## 2016-10-31 NOTE — Anesthesia Postprocedure Evaluation (Signed)
Anesthesia Post Note  Patient: [name]  Procedure(s) Performed: Procedure(s) (LRB): OPEN CHOLECYSTECTOMY (N/A)  Patient location during evaluation: PACU Anesthesia Type: General Level of consciousness: awake and alert Pain management: pain level controlled Vital Signs Assessment: post-procedure vital signs reviewed and stable Respiratory status: spontaneous breathing, nonlabored ventilation, respiratory function stable and patient connected to nasal cannula oxygen Cardiovascular status: blood pressure returned to baseline and stable Postop Assessment: no signs of nausea or vomiting Anesthetic complications: no    Last Vitals:  Vitals:   10/31/16 1353 10/31/16 1400  BP: (!) 146/80   Pulse: 83 80  Resp: 17 17  Temp:      Last Pain:  Vitals:   10/31/16 1445  TempSrc:   PainSc: 9                  Haldon Carley,W. EDMOND

## 2016-11-01 ENCOUNTER — Encounter (HOSPITAL_COMMUNITY): Payer: Self-pay | Admitting: General Surgery

## 2016-11-01 LAB — BASIC METABOLIC PANEL
Anion gap: 9 (ref 5–15)
BUN: 21 mg/dL — AB (ref 6–20)
CHLORIDE: 101 mmol/L (ref 101–111)
CO2: 26 mmol/L (ref 22–32)
Calcium: 8.2 mg/dL — ABNORMAL LOW (ref 8.9–10.3)
Creatinine, Ser: 0.81 mg/dL (ref 0.61–1.24)
GFR calc Af Amer: >60 mL/min (ref >60)
GFR calc non Af Amer: >60 mL/min (ref >60)
Glucose, Bld: 120 mg/dL — ABNORMAL HIGH (ref 65–99)
POTASSIUM: 3.9 mmol/L (ref 3.5–5.1)
SODIUM: 136 mmol/L (ref 135–145)

## 2016-11-01 MED ORDER — HYDROMORPHONE HCL 2 MG/ML IJ SOLN
1.5000 mg | INTRAMUSCULAR | Status: DC | PRN
Start: 2016-11-01 — End: 2016-11-05
  Administered 2016-11-01 – 2016-11-05 (×23): 1.5 mg via INTRAVENOUS
  Filled 2016-11-01 (×23): qty 1

## 2016-11-01 MED ORDER — DIPHENHYDRAMINE HCL 12.5 MG/5ML PO ELIX: 12.5000 mg | ORAL_SOLUTION | Freq: Four times a day (QID) | ORAL | Status: DC | PRN

## 2016-11-01 MED ORDER — SODIUM CHLORIDE 0.9% FLUSH: 9.0000 mL | INTRAVENOUS | Status: DC | PRN

## 2016-11-01 MED ORDER — DIPHENHYDRAMINE HCL 50 MG/ML IJ SOLN: 12.5000 mg | Freq: Four times a day (QID) | INTRAMUSCULAR | Status: DC | PRN

## 2016-11-01 MED ORDER — HYDROMORPHONE 1 MG/ML IV SOLN
INTRAVENOUS | Status: DC
Start: 2016-11-01 — End: 2016-11-01
  Administered 2016-11-01: 11:00:00 via INTRAVENOUS
  Administered 2016-11-01: 2.3 mg via INTRAVENOUS
  Filled 2016-11-01: qty 25

## 2016-11-01 MED ORDER — NALOXONE HCL 0.4 MG/ML IJ SOLN: 0.4000 mg | INTRAMUSCULAR | Status: DC | PRN

## 2016-11-01 MED ORDER — KCL IN DEXTROSE-NACL 20-5-0.45 MEQ/L-%-% IV SOLN
INTRAVENOUS | Status: DC
  Administered 2016-11-01 – 2016-11-05 (×5): via INTRAVENOUS
  Filled 2016-11-01 (×5): qty 1000

## 2016-11-01 MED ORDER — HYDROMORPHONE HCL 2 MG/ML IJ SOLN
INTRAMUSCULAR | Status: AC
  Administered 2016-11-01: 1.5 mg via INTRAVENOUS
  Filled 2016-11-01: qty 1

## 2016-11-01 MED ORDER — ONDANSETRON HCL 4 MG/2ML IJ SOLN
4.0000 mg | Freq: Four times a day (QID) | INTRAMUSCULAR | Status: DC | PRN
Start: 2016-11-01 — End: 2016-11-01

## 2016-11-01 NOTE — Progress Notes (Signed)
3 mg of pca IV dilaudid used from 1200 to 1600.

## 2016-11-01 NOTE — Progress Notes (Signed)
[facility] Office:  [phone] General Surgery Progress Note   LOS: 2 days  POD -  1 Day Post-Op  Assessment/Plan: 1.  OPEN CHOLECYSTECTOMY - 10/31/2016 - [place]  Drain - 80 cc recorded - will go home with this to be removed in the office by Dr. Donne Hazel.  Zosyn  Will start clear liquids  2.  DVT prophylaxis - to start lovenox 3.  COPD 4.  Pain control  He takes 7.5 mg of oxycodone 8 times per day for chronic arthritic pain through Dr. Jerilynn Mages. [place]  Controlling his pain post op will be an issue  I will start PCA dilaudid   Principal Problem:   Cholecystitis Active Problems:   Hypothyroidism   Anxiety state   COPD GOLD 0    Lung nodule   GERD   Abdominal pain   Nausea and vomiting   Acute kidney injury ([place])   Hiatal hernia   Subjective:  In a lot of pain.  But he takes a large amount of chronic oral narcotic - so pain control will be difficult.  Wife in room  Objective:   Vitals:   11/01/16 0039 11/01/16 0610  BP: 130/78 140/88  Pulse: 77 90  Resp:  18  Temp: 98.3 F (36.8 C) 98.2 F (36.8 C)     Intake/Output from previous day:  12/01 0701 - 12/02 0700 In: 2326.2 [I.V.:1151.2; IV Piggyback:500] Out: [id number] [Urine:1250; Drains:80; Blood:200]  Intake/Output this shift:  No intake/output data recorded.   Physical Exam:   General: Thin older WM who is alert and oriented.    HEENT: Normal. Pupils equal. .   Lungs: Clear.  IS = 1,000 cc   Abdomen: Quiet, soft.   Wound: Dressed Drain - 80 cc recorded the last 24 hours   Lab Results:    Recent Labs  10/30/16 1051 10/31/16 0512  WBC 30.7* 22.7*  HGB 15.8 12.7*  HCT 46.1 38.5*  PLT 251 234    BMET   Recent Labs  10/31/16 0512 11/01/16 0612  NA 133* 136  K 3.7 3.9  CL 97* 101  CO2 26 26  GLUCOSE 113* 120*  BUN 31* 21*  CREATININE 1.07 0.81  CALCIUM 8.3* 8.2*    PT/INR   Recent Labs  10/31/16 0512  LABPROT 15.1  INR 1.18    ABG  No results for input(s): PHART,  HCO3 in the last 72 hours.  Invalid input(s): PCO2, PO2   Studies/Results:  Ct Abdomen Pelvis W Contrast  Result Date: 10/30/2016 CLINICAL DATA:  Abdominal pain, vomiting for several days, history of Crohn's disease with prior partial colonic resection EXAM: CT ABDOMEN AND PELVIS WITH CONTRAST TECHNIQUE: Multidetector CT imaging of the abdomen and pelvis was performed using the standard protocol following bolus administration of intravenous contrast. CONTRAST:  1 ISOVUE-300 IOPAMIDOL (ISOVUE-300) INJECTION 61% COMPARISON:  CT abdomen pelvis of 01/15/2013 FINDINGS: Lower chest: There is hyper aeration at the lung bases. A calcified granuloma is noted anteriorly in the right middle lobe. However, there is a noncalcified soft tissue nodule within the left lower lobe on image number 4 measuring 12 mm. Previously this lesion measured 11 mm, and this lack of interval change suggests a benign process. Hepatobiliary: There does appear to be a small hiatal hernia present. The liver enhances with no focal abnormality. However, there is abnormality of the gallbladder which appears distended with gallbladder wall edema and probable pericholecystic fluid. These findings are highly suspicious for acute cholecystitis. No calcified gallstones are  seen. Pancreas: The pancreas is normal in size and the pancreatic duct is not dilated. Spleen: The spleen is unremarkable other than multiple calcified splenic granulomas consistent with prior granulomatous disease Adrenals/Urinary Tract: The adrenal glands are unremarkable. The kidneys enhance with no calculus or mass and there is no evidence of hydronephrosis. A cyst as emanate from the upper pole of the left kidney which is unchanged. On delayed images, the pelvocaliceal systems are unremarkable. Ureters are normal in caliber. The urinary bladder is moderately distended and is indented by the significantly enlarged prostate. Stomach/Bowel: The stomach is decompressed and cannot  be evaluated. There is some small bowel distension most likely due to mild ileus. No definite obstruction is seen. There is feces throughout the colon. Vascular/Lymphatic: The abdominal aorta is normal in caliber with moderate abdominal aortic atherosclerosis present. No adenopathy is seen. Reproductive: The prostate is significantly enlarged measuring 5.9 x 6.7 cm appearing somewhat inhomogeneous. The prostate does indent the posterior inferior urinary bladder in the midline. Other: None Musculoskeletal: The lumbar vertebrae are in normal alignment. There is degenerative disc disease at L5-S1. The SI joints appear fused in this patient with inflammatory bowel disease. IMPRESSION: 1. Marked distention of the gallbladder with gallbladder wall edema and pericholecystic fluid, findings most consistent with acute cholecystitis. There are surrounding inflammatory changes as well. No definite gallstones are seen. 2. Stable noncalcified nodule in the left lower lobe compared to the CT from 2014 suggesting a benign process. 3. Small hiatal hernia. 4. Multiple calcified splenic granulomas from prior granulomatous disease. 5. Significant Li enlarged prostate indenting the urinary bladder. 6. Moderate abdominal aortic atherosclerosis. Critical Value/emergent results were called by telephone at the time of interpretation on 10/30/2016 at 1:35 pm to Dr. Gareth Morgan , who verbally acknowledged these results. Electronically Signed   By: Ivar Drape M.D.   On: 10/30/2016 13:35   Dg Chest Port 1 View  Result Date: 10/30/2016 CLINICAL DATA:  Preoperative examination prior to abdominal surgery. EXAM: PORTABLE CHEST 1 VIEW COMPARISON:  10/16/2015 and prior exams FINDINGS: The cardiomediastinal silhouette is unremarkable. There is no evidence of focal airspace disease, pulmonary edema, suspicious pulmonary nodule/mass, pleural effusion, or pneumothorax. No acute bony abnormalities are identified. IMPRESSION: No active disease.  Electronically Signed   By: Margarette Canada M.D.   On: 10/30/2016 16:45   US Abdomen Limited Ruq  Result Date: 10/30/2016 CLINICAL DATA:  Right upper quadrant pain over the last 6 weeks. EXAM: US ABDOMEN LIMITED - RIGHT UPPER QUADRANT COMPARISON:  CT 10/30/2016 FINDINGS: Gallbladder: Distended gallbladder with mild wall thickening and prominent pericholecystic edema. Partially shadowing filling defect within the gallbladder measuring 4 x 4 x 10 cm most consistent with tumefactive sludge. Common bile duct: Diameter: 5.7 mm, upper limits normal. Liver: Normal appearance of the liver. Trace free fluid adjacent to the right liver age. IMPRESSION: Findings most consistent with acute cholecystitis. The gallbladder is distended and shows pericholecystic fluid. Large tumefactive sludge ball within the gallbladder measuring up to 10 cm in diameter. Electronically Signed   By: Nelson Chimes M.D.   On: 10/30/2016 15:22     Anti-infectives:   Anti-infectives    Start     Dose/Rate Route Frequency Ordered Stop   10/30/16 1800  piperacillin-tazobactam (ZOSYN) IVPB 3.375 g     3.375 g 12.5 mL/hr over 240 Minutes Intravenous Every 8 hours 10/30/16 1514     10/30/16 1145  piperacillin-tazobactam (ZOSYN) IVPB 3.375 g     3.375 g 100 mL/hr over  30 Minutes Intravenous  Once 10/30/16 1138 10/30/16 1359      Alphonsa Overall, MD, FACS Pager: [phone] Surgery Office: [phone] 11/01/2016

## 2016-11-01 NOTE — Progress Notes (Signed)
Pt ambulated in hallway about 150 feet

## 2016-11-01 NOTE — Plan of Care (Signed)
Problem: Safety: Goal: Ability to remain free from injury will improve Outcome: Progressing No fall or injury noted  Problem: Tissue Perfusion: Goal: Risk factors for ineffective tissue perfusion will decrease Outcome: Progressing No s/s of dvt. SCDs on both legs  Problem: Bowel/Gastric: Goal: Will not experience complications related to bowel motility Outcome: Progressing Positive/active bowel sounds all quadrants, c/o hiccups, medicated with Phenergan with moderate relief. Denies vomiting

## 2016-11-01 NOTE — Progress Notes (Signed)
PROGRESS NOTE    [name]  [id number] DOB: May 02, 1946 DOA: 10/30/2016 PCP: Reginia Naas, MD    Brief Narrative:  [name] is a 70 y.o. 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 Initial evaluation includes CT of the abdomen concerning for cholecystitis leukocytosis acute kidney injury  Information is obtained from the patient and his wife and daughter who are at the bedside. He reports 3 day history of abdominal pain. States the pain located in the epigastric area initially and then spread to the right upper and lower abdomen. Describes as constant aching-like pain. Worse on the right upper and lower abdomen. Associated symptoms include nausea and vomiting. Reports 10 episodes emesis over last 2 days. One episode coffee ground emesis this am. Reports trying to drink water over the last 2 days but this may be abdominal pain worse and he would vomit shortly thereafter. He denies fever chills headache dizziness syncope or near-syncope. He denies dysuria hematuria frequency or urgency. He denies diarrhea constipation melena bright red blood per rectum. He denies chest pain palpitation shortness of breath.    Assessment & Plan:   Principal Problem:   Cholecystitis Active Problems:   Hypothyroidism   Anxiety state   COPD GOLD 0    Lung nodule   GERD   Abdominal pain   Nausea and vomiting   Acute kidney injury ([place])   Hiatal hernia   Cholecystitis - s/p open cholecystectomy - CT of the abdomen reveals markedly distention of gallbladder with gallbladder wall edema and pericholecystic fluid consistent with acute cholecystitis. Also inflammatory changes no definite stones. He does have a leukocytosis and tachycardia is afebrile. He is provided with IV fluids  Zosyn analgesia anti-emetics. [place] with gallbladder distention/fluid and sludge ball -Continue IV fluids (D5 0.5NS w/ 53mEq KCL) - Clear liquid diet -Continue IV analgesia -Continue IV anti emetic -Continue Zosyn - Diluadid PCA per surgery  Tachycardia likely related to above. Afebrile. -Continue Zosyn -Follow blood cultures (No growth <24 hours)  Acute kidney injury. Likely related to above.  -Creatinine 1.3 on admission -IV fluids as noted above -Hold nephrotoxins -Monitor urine output (1150 yesterday) - Creatinine improved this morning  Hypertension. Fair control in the emergency department. Home medications includen amlodipine -Hold amlodipine for now - provide scheduled IV beta blocker with parameters -prn hydralazine -Resume home regimen when able (when able to take PO medications) -Monitor closely  COPD. Appears stable at baseline. Oxygen saturation level >90%.  -continue home meds  Lung nodule. Stable. Chart review indicates CT evaluated by pulmonary 9.2017. Continue surveilance per note  Carcinoma of right vocal cord. S/p radiation therapy.      DVT prophylaxis: scd Code Status: full Family Communication: wife and daughter at bedside  Disposition Plan: home   Consultants:   General Surgery  Procedures:  Laparoscopic lysis of adhesions, laparoscopic converted to open subtotal cholecystectomy, wedge biopsy of liver lesion  Antimicrobials:   Zosyn    Subjective: Patient seen this morning.  His eyes are closed and he mentions he is in quite a bit of pain.  States to me that his pain medication pump was just started so he isn't sure how it will work.  He asked that I examine his chest and extremities only as his abdomen was too painful to allow me to even listen.  Objective: Vitals:  11/01/16 0640 11/01/16 0923 11/01/16 1031 11/01/16 1200  BP:      Pulse:      Resp:   (!) 23 10  Temp:      TempSrc:      SpO2:  99% 97% 98%  Weight: 84.3 kg  (185 lb 12.8 oz)     Height:        Intake/Output Summary (Last 24 hours) at 11/01/16 1307 Last data filed at 11/01/16 [id number]  Gross per 24 hour  Intake          1226.17 ml  Output              955 ml  Net           271.17 ml   Filed Weights   10/30/16 1130 10/31/16 0600 11/01/16 0640  Weight: 83.9 kg (185 lb) (!) 174.6 kg (384 lb 14.8 oz) 84.3 kg (185 lb 12.8 oz)    Examination:  General exam: moderate distress  Respiratory system: Clear to auscultation. Respiratory effort normal. Cardiovascular system: S1 & S2 heard, RRR. No JVD, murmurs, rubs, gallops or clicks. No pedal edema. Gastrointestinal system: deferred per patient request Central nervous system: Alert and oriented. No focal neurological deficits. Extremities: Symmetric 5 x 5 power. Skin: unable to examine surgical site due to patient refusal of exam Psychiatry: Judgement and insight appear normal. Mood & affect appropriate.     Data Reviewed: I have personally reviewed following labs and imaging studies  CBC:  Recent Labs Lab 10/30/16 1051 10/31/16 0512  WBC 30.7* 22.7*  HGB 15.8 12.7*  HCT 46.1 38.5*  MCV 90.9 91.2  PLT 251 [id number]   Basic Metabolic Panel:  Recent Labs Lab 10/30/16 1051 10/31/16 0512 11/01/16 0612  NA 132* 133* 136  K 4.3 3.7 3.9  CL 94* 97* 101  CO2 26 26 26   GLUCOSE 127* 113* 120*  BUN 30* 31* 21*  CREATININE 1.34* 1.07 0.81  CALCIUM 9.3 8.3* 8.2*   GFR: Estimated Creatinine Clearance: 98.3 mL/min (by C-G formula based on SCr of 0.81 mg/dL). Liver Function Tests:  Recent Labs Lab 10/30/16 1051  AST 25  ALT 26  ALKPHOS 23*  BILITOT 1.1  PROT 7.9  ALBUMIN 4.0    Recent Labs Lab 10/30/16 1051  LIPASE 17   No results for input(s): AMMONIA in the last 168 hours. Coagulation Profile:  Recent Labs Lab 10/31/16 0512  INR 1.18   Cardiac Enzymes: No results for input(s): CKTOTAL, CKMB, CKMBINDEX, TROPONINI in the last 168 hours. BNP (last 3 results) No results  for input(s): PROBNP in the last 8760 hours. HbA1C: No results for input(s): HGBA1C in the last 72 hours. CBG: No results for input(s): GLUCAP in the last 168 hours. Lipid Profile: No results for input(s): CHOL, HDL, LDLCALC, TRIG, CHOLHDL, LDLDIRECT in the last 72 hours. Thyroid Function Tests:  Recent Labs  10/30/16 1605  TSH 2.277   Anemia Panel: No results for input(s): VITAMINB12, FOLATE, FERRITIN, TIBC, IRON, RETICCTPCT in the last 72 hours. Sepsis Labs:  Recent Labs Lab 10/30/16 1604 10/30/16 2009  LATICACIDVEN 2.0* 2.2*    Recent Results (from the past 240 hour(s))  Blood culture (routine x 2)     Status: None (Preliminary result)   Collection Time: 10/30/16 12:49 PM  Result Value Ref Range Status   Specimen Description BLOOD RIGHT ANTECUBITAL  Final   Special Requests BOTTLES DRAWN AEROBIC AND ANAEROBIC 5 CC  Final   Culture NO GROWTH < 24 HOURS  Final   Report Status PENDING  Incomplete  Blood culture (routine x 2)     Status: None (Preliminary result)   Collection Time: 10/30/16 12:49 PM  Result Value Ref Range Status   Specimen Description BLOOD RIGHT HAND  Final   Special Requests BOTTLES DRAWN AEROBIC AND ANAEROBIC 5 CC  Final   Culture NO GROWTH < 24 HOURS  Final   Report Status PENDING  Incomplete  Surgical PCR screen     Status: Abnormal   Collection Time: 10/30/16 11:47 PM  Result Value Ref Range Status   MRSA, PCR NEGATIVE NEGATIVE Final   Staphylococcus aureus POSITIVE (A) NEGATIVE Final    Comment:        The Xpert SA Assay (FDA approved for NASAL specimens in patients over 59 years of age), is one component of a comprehensive surveillance program.  Test performance has been validated by [facility] for patients greater than or equal to 18 year old. It is not intended to diagnose infection nor to guide or monitor treatment.          Radiology Studies: Ct Abdomen Pelvis W Contrast  Result Date: 10/30/2016 CLINICAL DATA:   Abdominal pain, vomiting for several days, history of Crohn's disease with prior partial colonic resection EXAM: CT ABDOMEN AND PELVIS WITH CONTRAST TECHNIQUE: Multidetector CT imaging of the abdomen and pelvis was performed using the standard protocol following bolus administration of intravenous contrast. CONTRAST:  1 ISOVUE-300 IOPAMIDOL (ISOVUE-300) INJECTION 61% COMPARISON:  CT abdomen pelvis of 01/15/2013 FINDINGS: Lower chest: There is hyper aeration at the lung bases. A calcified granuloma is noted anteriorly in the right middle lobe. However, there is a noncalcified soft tissue nodule within the left lower lobe on image number 4 measuring 12 mm. Previously this lesion measured 11 mm, and this lack of interval change suggests a benign process. Hepatobiliary: There does appear to be a small hiatal hernia present. The liver enhances with no focal abnormality. However, there is abnormality of the gallbladder which appears distended with gallbladder wall edema and probable pericholecystic fluid. These findings are highly suspicious for acute cholecystitis. No calcified gallstones are seen. Pancreas: The pancreas is normal in size and the pancreatic duct is not dilated. Spleen: The spleen is unremarkable other than multiple calcified splenic granulomas consistent with prior granulomatous disease Adrenals/Urinary Tract: The adrenal glands are unremarkable. The kidneys enhance with no calculus or mass and there is no evidence of hydronephrosis. A cyst as emanate from the upper pole of the left kidney which is unchanged. On delayed images, the pelvocaliceal systems are unremarkable. Ureters are normal in caliber. The urinary bladder is moderately distended and is indented by the significantly enlarged prostate. Stomach/Bowel: The stomach is decompressed and cannot be evaluated. There is some small bowel distension most likely due to mild ileus. No definite obstruction is seen. There is feces throughout the colon.  Vascular/Lymphatic: The abdominal aorta is normal in caliber with moderate abdominal aortic atherosclerosis present. No adenopathy is seen. Reproductive: The prostate is significantly enlarged measuring 5.9 x 6.7 cm appearing somewhat inhomogeneous. The prostate does indent the posterior inferior urinary bladder in the midline. Other: None Musculoskeletal: The lumbar vertebrae are in normal alignment. There is degenerative disc disease at L5-S1. The SI joints appear fused in this patient with inflammatory bowel disease. IMPRESSION: 1. Marked distention of the gallbladder with gallbladder wall edema and pericholecystic fluid, findings most consistent with acute cholecystitis. There are surrounding inflammatory changes as well. No definite gallstones are seen.  2. Stable noncalcified nodule in the left lower lobe compared to the CT from 2014 suggesting a benign process. 3. Small hiatal hernia. 4. Multiple calcified splenic granulomas from prior granulomatous disease. 5. Significant Li enlarged prostate indenting the urinary bladder. 6. Moderate abdominal aortic atherosclerosis. Critical Value/emergent results were called by telephone at the time of interpretation on 10/30/2016 at 1:35 pm to Dr. Gareth Morgan , who verbally acknowledged these results. Electronically Signed   By: Ivar Drape M.D.   On: 10/30/2016 13:35   Dg Chest Port 1 View  Result Date: 10/30/2016 CLINICAL DATA:  Preoperative examination prior to abdominal surgery. EXAM: PORTABLE CHEST 1 VIEW COMPARISON:  10/16/2015 and prior exams FINDINGS: The cardiomediastinal silhouette is unremarkable. There is no evidence of focal airspace disease, pulmonary edema, suspicious pulmonary nodule/mass, pleural effusion, or pneumothorax. No acute bony abnormalities are identified. IMPRESSION: No active disease. Electronically Signed   By: Margarette Canada M.D.   On: 10/30/2016 16:45   US Abdomen Limited Ruq  Result Date: 10/30/2016 CLINICAL DATA:  Right upper  quadrant pain over the last 6 weeks. EXAM: US ABDOMEN LIMITED - RIGHT UPPER QUADRANT COMPARISON:  CT 10/30/2016 FINDINGS: Gallbladder: Distended gallbladder with mild wall thickening and prominent pericholecystic edema. Partially shadowing filling defect within the gallbladder measuring 4 x 4 x 10 cm most consistent with tumefactive sludge. Common bile duct: Diameter: 5.7 mm, upper limits normal. Liver: Normal appearance of the liver. Trace free fluid adjacent to the right liver age. IMPRESSION: Findings most consistent with acute cholecystitis. The gallbladder is distended and shows pericholecystic fluid. Large tumefactive sludge ball within the gallbladder measuring up to 10 cm in diameter. Electronically Signed   By: Nelson Chimes M.D.   On: 10/30/2016 15:22        Scheduled Meds: . amLODipine  10 mg Oral Daily  . Chlorhexidine Gluconate Cloth  6 each Topical Daily  . DULoxetine  90 mg Oral Daily  . HYDROmorphone   Intravenous Q4H  . levothyroxine  150 mcg Oral QAC breakfast  . metoprolol  2.5 mg Intravenous Q6H  . mometasone-formoterol  2 puff Inhalation BID  . mupirocin ointment  1 application Nasal BID  . piperacillin-tazobactam (ZOSYN)  IV  3.375 g Intravenous Q8H   Continuous Infusions: . dextrose 5 % and 0.45 % NaCl with KCl 20 mEq/L 75 mL/hr at 11/01/16 1026     LOS: 2 days    Time spent: 35 minutes    Loretha Stapler, MD [facility] Pager [phone]  If 7PM-7AM, please contact night-coverage [email or web address] Password [email or web address] 11/01/2016, 1:07 PM

## 2016-11-02 LAB — COMPREHENSIVE METABOLIC PANEL
ALBUMIN: 2.4 g/dL — AB (ref 3.5–5.0)
ALT: 52 U/L (ref 17–63)
ANION GAP: 7 (ref 5–15)
AST: 60 U/L — AB (ref 15–41)
Alkaline Phosphatase: 20 U/L — ABNORMAL LOW (ref 38–126)
BILIRUBIN TOTAL: 0.4 mg/dL (ref 0.3–1.2)
BUN: 18 mg/dL (ref 6–20)
CHLORIDE: 99 mmol/L — AB (ref 101–111)
CO2: 28 mmol/L (ref 22–32)
Calcium: 8.2 mg/dL — ABNORMAL LOW (ref 8.9–10.3)
Creatinine, Ser: 0.75 mg/dL (ref 0.61–1.24)
GFR calc Af Amer: >60 mL/min (ref >60)
GFR calc non Af Amer: >60 mL/min (ref >60)
GLUCOSE: 106 mg/dL — AB (ref 65–99)
POTASSIUM: 3.8 mmol/L (ref 3.5–5.1)
SODIUM: 134 mmol/L — AB (ref 135–145)
TOTAL PROTEIN: 5.9 g/dL — AB (ref 6.5–8.1)

## 2016-11-02 LAB — CBC WITH DIFFERENTIAL/PLATELET
BASOS ABS: 0 10*3/uL (ref 0.0–0.1)
BASOS PCT: 0 %
EOS ABS: 0 10*3/uL (ref 0.0–0.7)
Eosinophils Relative: 0 %
HCT: 31.6 % — ABNORMAL LOW (ref 39.0–52.0)
Hemoglobin: 10.5 g/dL — ABNORMAL LOW (ref 13.0–17.0)
Lymphocytes Relative: 11 %
Lymphs Abs: 1.1 10*3/uL (ref 0.7–4.0)
MCH: 30.1 pg (ref 26.0–34.0)
MCHC: 33.2 g/dL (ref 30.0–36.0)
MCV: 90.5 fL (ref 78.0–100.0)
MONO ABS: 1.3 10*3/uL — AB (ref 0.1–1.0)
MONOS PCT: 12 %
NEUTROS ABS: 8.2 10*3/uL — AB (ref 1.7–7.7)
Neutrophils Relative %: 77 %
PLATELETS: 298 10*3/uL (ref 150–400)
RBC: 3.49 MIL/uL — ABNORMAL LOW (ref 4.22–5.81)
RDW: 14.1 % (ref 11.5–15.5)
WBC: 10.7 10*3/uL — ABNORMAL HIGH (ref 4.0–10.5)

## 2016-11-02 MED ORDER — OXYCODONE HCL 5 MG PO TABS
7.5000 mg | ORAL_TABLET | Freq: Four times a day (QID) | ORAL | Status: DC
  Administered 2016-11-02 – 2016-11-06 (×18): 7.5 mg via ORAL
  Filled 2016-11-02 (×18): qty 2

## 2016-11-02 MED ORDER — CHLORPROMAZINE HCL 25 MG PO TABS
25.0000 mg | ORAL_TABLET | Freq: Three times a day (TID) | ORAL | Status: DC | PRN
  Administered 2016-11-02 – 2016-11-05 (×10): 25 mg via ORAL
  Filled 2016-11-02 (×13): qty 1

## 2016-11-02 NOTE — Progress Notes (Signed)
Pharmacy Antibiotic Note  [name] is a 70 y.o. male admitted on 10/30/2016 with intra-abdominal infection.  Pharmacy has been consulted for Zosyn dosing.  Patient's SCr is improving.  He remains afebrile and his WBC is trending down.  Cultures NGTD.  Plan: Continue Zosyn 3.375 gm IV Q8H, 4 hr infusion Pharmacy will sign off as dosage adjustment is likely unnecessary.  Thank you for the consult! LOT per MD    Height: 6\' 3"  (190.5 cm) Weight: 185 lb 12.8 oz (84.3 kg) IBW/kg (Calculated) : 84.5  Temp (24hrs), Avg:98.1 F (36.7 C), Min:97.9 F (36.6 C), Max:98.3 F (36.8 C)   Recent Labs Lab 10/30/16 1051 10/30/16 1604 10/30/16 2009 10/31/16 0512 11/01/16 0612 11/02/16 0349  WBC 30.7*  --   --  22.7*  --  10.7*  CREATININE 1.34*  --   --  1.07 0.81 0.75  LATICACIDVEN  --  2.0* 2.2*  --   --   --     Estimated Creatinine Clearance: 99.5 mL/min (by C-G formula based on SCr of 0.75 mg/dL).    Allergies  Allergen Reactions  . Doxycycline Nausea And Vomiting  . Guaifenesin Nausea And Vomiting  . Morphine Sulfate Other (See Comments)  . Tapentadol Other (See Comments)    Antimicrobials this admission: Zosyn 11/30 >>   Dose adjustments this admission: N/A  Microbiology results: 11/30 BCx x2 - NGTD 11/30 SA PCR - positive   Agostino Gorin D. Mina Marble, PharmD, BCPS Pager:  [phone] 11/02/2016, 7:45 AM

## 2016-11-02 NOTE — Progress Notes (Signed)
PROGRESS NOTE    [name]  [id number] DOB: 10-08-1946 DOA: 10/30/2016 PCP: Reginia Naas, MD    Brief Narrative:  [name] is a 70 y.o. 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 Initial evaluation includes CT of the abdomen concerning for cholecystitis leukocytosis acute kidney injury  Information is obtained from the patient and his wife and daughter who are at the bedside. He reports 3 day history of abdominal pain. States the pain located in the epigastric area initially and then spread to the right upper and lower abdomen. Describes as constant aching-like pain. Worse on the right upper and lower abdomen. Associated symptoms include nausea and vomiting. Reports 10 episodes emesis over last 2 days. One episode coffee ground emesis this am. Reports trying to drink water over the last 2 days but this may be abdominal pain worse and he would vomit shortly thereafter. He denies fever chills headache dizziness syncope or near-syncope. He denies dysuria hematuria frequency or urgency. He denies diarrhea constipation melena bright red blood per rectum. He denies chest pain palpitation shortness of breath.    Assessment & Plan:   Principal Problem:   Cholecystitis Active Problems:   Hypothyroidism   Anxiety state   COPD GOLD 0    Lung nodule   GERD   Abdominal pain   Nausea and vomiting   Acute kidney injury ([place])   Hiatal hernia   Cholecystitis - s/p open cholecystectomy - CT of the abdomen reveals markedly distention of gallbladder with gallbladder wall edema and pericholecystic fluid consistent with acute cholecystitis. Also inflammatory changes no definite stones. He does have a leukocytosis and tachycardia is afebrile. He is provided with IV fluids  Zosyn analgesia anti-emetics. [place] with gallbladder distention/fluid and sludge ball -Continue IV fluids (D5 0.5NS w/ 34mEq KCL) - Clear liquid diet and does not want to advance diet due to pain -Continue IV analgesia -Continue IV anti emetic -Continue Zosyn - Diluadid IV per surgery  Tachycardia likely related to above. Afebrile. -Continue Zosyn -Follow blood cultures (No growth x2 days)  Acute kidney injury. Likely related to above.  -Creatinine 1.3 on admission -IV fluids as noted above -Hold nephrotoxins -Monitor urine output (850 yesterday) - Creatinine improved this morning  Hypertension. Fair control in the emergency department. Home medications includen amlodipine - Amlodipine PO daily - provide scheduled IV beta blocker with parameters -prn hydralazine -Monitor closely - BP well controlled however patient's pain may be part of few hypertensive BP  COPD. Appears stable at baseline. Oxygen saturation level >90%.  -continue home meds  Lung nodule. Stable. Chart review indicates CT evaluated by pulmonary 9.2017. Continue surveilance per note  Carcinoma of right vocal cord. S/p radiation therapy.      DVT prophylaxis: scd Code Status: full Family Communication: wife and daughter at bedside  Disposition Plan: home   Consultants:   General Surgery  Procedures:  Laparoscopic lysis of adhesions, laparoscopic converted to open subtotal cholecystectomy, wedge biopsy of liver lesion  Antimicrobials:   Zosyn    Subjective: Patient seen this morning.  He states he does not feel well.  Reports that his pain is significant and his hiccups are playing a significant part of his pain.  Taken off PCA and put back on diluadid push instead.  Has not taken any of his morning PO medications (they are  in a cup bedside).  Objective: Vitals:   11/02/16 0002 11/02/16 0605 11/02/16 1100 11/02/16 1114  BP: 132/79 132/81 137/77   Pulse: 82 81 79   Resp:      Temp: 98.3  F (36.8 C) 98.1 F (36.7 C)    TempSrc: Oral Oral    SpO2: 97% 98%  98%  Weight:      Height:        Intake/Output Summary (Last 24 hours) at 11/02/16 1349 Last data filed at 11/02/16 1300  Gross per 24 hour  Intake           2677.5 ml  Output             1205 ml  Net           1472.5 ml   Filed Weights   10/30/16 1130 10/31/16 0600 11/01/16 0640  Weight: 83.9 kg (185 lb) (!) 174.6 kg (384 lb 14.8 oz) 84.3 kg (185 lb 12.8 oz)    Examination:  General exam: moderate distress  Respiratory system: Clear to auscultation. Respiratory effort normal. Cardiovascular system: S1 & S2 heard, RRR. No JVD, murmurs, rubs, gallops or clicks. No pedal edema. Gastrointestinal system: hypoactive bowel sounds; patient declined palpation Central nervous system: Alert and oriented. No focal neurological deficits. Extremities: Symmetric 5 x 5 power. Skin: unable to examine surgical site due to patient refusal of exam Psychiatry: Judgement and insight appear normal. Mood & affect appropriate.     Data Reviewed: I have personally reviewed following labs and imaging studies  CBC:  Recent Labs Lab 10/30/16 1051 10/31/16 0512 11/02/16 0349  WBC 30.7* 22.7* 10.7*  NEUTROABS  --   --  8.2*  HGB 15.8 12.7* 10.5*  HCT 46.1 38.5* 31.6*  MCV 90.9 91.2 90.5  PLT 251 234 [id number]   Basic Metabolic Panel:  Recent Labs Lab 10/30/16 1051 10/31/16 0512 11/01/16 0612 11/02/16 0349  NA 132* 133* 136 134*  K 4.3 3.7 3.9 3.8  CL 94* 97* 101 99*  CO2 26 26 26 28   GLUCOSE 127* 113* 120* 106*  BUN 30* 31* 21* 18  CREATININE 1.34* 1.07 0.81 0.75  CALCIUM 9.3 8.3* 8.2* 8.2*   GFR: Estimated Creatinine Clearance: 99.5 mL/min (by C-G formula based on SCr of 0.75 mg/dL). Liver Function Tests:  Recent Labs Lab 10/30/16 1051 11/02/16 0349  AST 25 60*  ALT 26 52  ALKPHOS 23* 20*  BILITOT 1.1 0.4  PROT 7.9 5.9*  ALBUMIN 4.0 2.4*    Recent Labs Lab 10/30/16 1051  LIPASE 17   No results  for input(s): AMMONIA in the last 168 hours. Coagulation Profile:  Recent Labs Lab 10/31/16 0512  INR 1.18   Cardiac Enzymes: No results for input(s): CKTOTAL, CKMB, CKMBINDEX, TROPONINI in the last 168 hours. BNP (last 3 results) No results for input(s): PROBNP in the last 8760 hours. HbA1C: No results for input(s): HGBA1C in the last 72 hours. CBG: No results for input(s): GLUCAP in the last 168 hours. Lipid Profile: No results for input(s): CHOL, HDL, LDLCALC, TRIG, CHOLHDL, LDLDIRECT in the last 72 hours. Thyroid Function Tests:  Recent Labs  10/30/16 1605  TSH 2.277   Anemia Panel: No results for input(s): VITAMINB12, FOLATE, FERRITIN, TIBC, IRON, RETICCTPCT in the last 72 hours. Sepsis Labs:  Recent Labs Lab 10/30/16 1604 10/30/16 2009  LATICACIDVEN 2.0* 2.2*    Recent Results (from the past 240 hour(s))  Blood culture (routine x 2)     Status: None (  Preliminary result)   Collection Time: 10/30/16 12:49 PM  Result Value Ref Range Status   Specimen Description BLOOD RIGHT ANTECUBITAL  Final   Special Requests BOTTLES DRAWN AEROBIC AND ANAEROBIC 5 CC  Final   Culture NO GROWTH 2 DAYS  Final   Report Status PENDING  Incomplete  Blood culture (routine x 2)     Status: None (Preliminary result)   Collection Time: 10/30/16 12:49 PM  Result Value Ref Range Status   Specimen Description BLOOD RIGHT HAND  Final   Special Requests BOTTLES DRAWN AEROBIC AND ANAEROBIC 5 CC  Final   Culture NO GROWTH 2 DAYS  Final   Report Status PENDING  Incomplete  Surgical PCR screen     Status: Abnormal   Collection Time: 10/30/16 11:47 PM  Result Value Ref Range Status   MRSA, PCR NEGATIVE NEGATIVE Final   Staphylococcus aureus POSITIVE (A) NEGATIVE Final    Comment:        The Xpert SA Assay (FDA approved for NASAL specimens in patients over 36 years of age), is one component of a comprehensive surveillance program.  Test performance has been validated by [facility] for  patients greater than or equal to 15 year old. It is not intended to diagnose infection nor to guide or monitor treatment.          Radiology Studies: No results found.      Scheduled Meds: . amLODipine  10 mg Oral Daily  . Chlorhexidine Gluconate Cloth  6 each Topical Daily  . DULoxetine  90 mg Oral Daily  . levothyroxine  150 mcg Oral QAC breakfast  . metoprolol  2.5 mg Intravenous Q6H  . mometasone-formoterol  2 puff Inhalation BID  . mupirocin ointment  1 application Nasal BID  . oxyCODONE  7.5 mg Oral QID  . piperacillin-tazobactam (ZOSYN)  IV  3.375 g Intravenous Q8H   Continuous Infusions: . dextrose 5 % and 0.45 % NaCl with KCl 20 mEq/L 75 mL/hr at 11/01/16 1026     LOS: 3 days    Time spent: 30 minutes    Loretha Stapler, MD [facility] Pager [phone]  If 7PM-7AM, please contact night-coverage [email or web address] Password [email or web address] 11/02/2016, 1:49 PM

## 2016-11-02 NOTE — Progress Notes (Addendum)
[facility]:  [phone] General Surgery Progress Note   LOS: 3 days  POD -  2 Days Post-Op  Assessment/Plan: 1.  OPEN CHOLECYSTECTOMY - 10/31/2016 - [place]  RUQ drain - will go home with this to be removed in the office by Dr. Donne Hazel.  Zosyn  On clear liquids - is not taking much and does not want to advance  2.  DVT prophylaxis - to start lovenox 3.  COPD 4.  Pain control  He takes 7.5 mg of oxycodone 8 times per day for chronic arthritic pain through Dr. Jerilynn Mages. Hardin Negus  Controlling his pain post op will be an issue  Did not like PCA dilaudid - back on dilaudid push  Will start oxycodone 7.5 mg QID - if he tolerates this, consider increasing to his "home" dose tomorrow 5.  Remove foley 6.  Hiccups - will try Thorazine    Principal Problem:   Cholecystitis Active Problems:   Hypothyroidism   Anxiety state   COPD GOLD 0    Lung nodule   GERD   Abdominal pain   Nausea and vomiting   Acute kidney injury ([place])   Hiatal hernia   Subjective:  Though he says he feels no better - he looks better today.  Still has hiccups.  Daughter in room with patient.  Objective:   Vitals:   11/02/16 0002 11/02/16 0605  BP: 132/79 132/81  Pulse: 82 81  Resp:    Temp: 98.3 F (36.8 C) 98.1 F (36.7 C)     Intake/Output from previous day:  12/02 0701 - 12/03 0700 In: 45  Out: 850 [Urine:850]  Intake/Output this shift:  No intake/output data recorded.   Physical Exam:   General: Thin older WM who is alert and oriented.    HEENT: Normal. Pupils equal. .   Lungs: Clear.     Abdomen: Quiet, soft.   Wound: Clean Drain - 80 cc recorded the last 24 hours   Lab Results:     Recent Labs  10/31/16 0512 11/02/16 0349  WBC 22.7* 10.7*  HGB 12.7* 10.5*  HCT 38.5* 31.6*  PLT 234 298    BMET    Recent Labs  11/01/16 0612 11/02/16 0349  NA 136 134*  K 3.9 3.8  CL 101 99*  CO2 26 28  GLUCOSE 120* 106*  BUN 21* 18  CREATININE 0.81 0.75   CALCIUM 8.2* 8.2*    PT/INR    Recent Labs  10/31/16 0512  LABPROT 15.1  INR 1.18    ABG  No results for input(s): PHART, HCO3 in the last 72 hours.  Invalid input(s): PCO2, PO2   Studies/Results:  No results found.   Anti-infectives:   Anti-infectives    Start     Dose/Rate Route Frequency Ordered Stop   10/30/16 1800  piperacillin-tazobactam (ZOSYN) IVPB 3.375 g     3.375 g 12.5 mL/hr over 240 Minutes Intravenous Every 8 hours 10/30/16 1514     10/30/16 1145  piperacillin-tazobactam (ZOSYN) IVPB 3.375 g     3.375 g 100 mL/hr over 30 Minutes Intravenous  Once 10/30/16 1138 10/30/16 1359      Alphonsa Overall, MD, FACS Pager: [facility] Office: [phone] 11/02/2016

## 2016-11-02 NOTE — Progress Notes (Signed)
Patient's wife visably and verbally upset that it has taken a long while for her husband to get an IV site established.  Assigned nurse attempted to start an IV as well as the charge nurse which were both unsuccessful attempts. IV team consult put in at 2pm.  Patient's wife states the patient needs the IV so that he can receive IV dilaudid for pain management.  Patient received oxycodone PO for pain around 2pm.  Patient is currently in bed asleep. No signs or symptoms of acute distress noted.   The charge nurse explained to the wife that the patient is resting comfortably, fast asleep. In cases like this, we can assume that the PO pain medicine is effective because the patient would more than likely not be able to sleep through excruciating pain. She states "yes, but he could wake up at any time". This nurse agreed and stated that the assigned nurse has already paged the IV team a second time for a call back to see where the patient's name was on the list of people they have to see for the day.  Patient continues to rest comfortably. Nursing will continue to monitor.

## 2016-11-03 DIAGNOSIS — G894 Chronic pain syndrome: Secondary | ICD-10-CM

## 2016-11-03 LAB — BASIC METABOLIC PANEL
Anion gap: 7 (ref 5–15)
BUN: 14 mg/dL (ref 6–20)
CALCIUM: 8.2 mg/dL — AB (ref 8.9–10.3)
CO2: 28 mmol/L (ref 22–32)
CREATININE: 0.69 mg/dL (ref 0.61–1.24)
Chloride: 99 mmol/L — ABNORMAL LOW (ref 101–111)
GFR calc non Af Amer: >60 mL/min (ref >60)
Glucose, Bld: 117 mg/dL — ABNORMAL HIGH (ref 65–99)
Potassium: 3.7 mmol/L (ref 3.5–5.1)
Sodium: 134 mmol/L — ABNORMAL LOW (ref 135–145)

## 2016-11-03 NOTE — Progress Notes (Signed)
3 Days Post-Op  Subjective: Emesis yesterday, hiccups persist but thorazine is helping  Objective: Vital signs in last 24 hours: Temp:  [98.3 F (36.8 C)-98.7 F (37.1 C)] 98.6 F (37 C) (12/04 0532) Pulse Rate:  [88-98] 88 (12/04 0532) Resp:  [14] 14 (12/03 1700) BP: (143-154)/(73-82) 150/79 (12/04 0532) SpO2:  [95 %-98 %] 95 % (12/04 0901) Last BM Date: 10/29/16  Intake/Output from previous day: 12/03 0701 - 12/04 0700 In: 3826.3 [P.O.:910; I.V.:2816.3; IV Piggyback:100] Out: 1460 [Urine:1450; Drains:10] Intake/Output this shift: Total I/O In: 120 [P.O.:120] Out: 200 [Urine:200]  GI: soft, incision CDI, JP SS, +BS1.     Lab Results: CBC   Recent Labs  11/02/16 0349  WBC 10.7*  HGB 10.5*  HCT 31.6*  PLT 298   BMET  Recent Labs  11/01/16 0612 11/02/16 0349  NA 136 134*  K 3.9 3.8  CL 101 99*  CO2 26 28  GLUCOSE 120* 106*  BUN 21* 18  CREATININE 0.81 0.75  CALCIUM 8.2* 8.2*   PT/INR No results for input(s): LABPROT, INR in the last 72 hours. ABG No results for input(s): PHART, HCO3 in the last 72 hours.  Invalid input(s): PCO2, PO2  Studies/Results: No results found.  Anti-infectives: Anti-infectives    Start     Dose/Rate Route Frequency Ordered Stop   10/30/16 1800  piperacillin-tazobactam (ZOSYN) IVPB 3.375 g     3.375 g 12.5 mL/hr over 240 Minutes Intravenous Every 8 hours 10/30/16 1514     10/30/16 1145  piperacillin-tazobactam (ZOSYN) IVPB 3.375 g     3.375 g 100 mL/hr over 30 Minutes Intravenous  Once 10/30/16 1138 10/30/16 1359      Assessment/Plan: s/p Procedure(s): OPEN CHOLECYSTECTOMY - 10/31/2016 - [place]  RUQ drain - will go home with this to be removed in the office by Dr. Donne Hazel.  Zosyn  On clear liquids - continue today until better bowel function  2.  DVT prophylaxis - lovenox 3.  COPD 4.  Pain control - oxy PO 5.  Hiccups - continue Thorazine  LOS: 4 days    Georganna Skeans, MD, MPH, FACS Trauma:  [phone] General Surgery: [phone]  11/03/2016

## 2016-11-03 NOTE — Care Management Important Message (Signed)
Important Message  Patient Details  Name: RILYN DELKER MRN: [id number] Date of Birth: 12/09/1945   Medicare Important Message Given:  Yes    Tasnim Balentine 11/03/2016, 11:06 AM

## 2016-11-03 NOTE — Progress Notes (Signed)
PROGRESS NOTE    JABIN KALICH  [id number] DOB: 09-13-1946 DOA: 10/30/2016 PCP: Reginia Naas, MD    Brief Narrative:  [name] is a 70 y.o. 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 Initial evaluation includes CT of the abdomen concerning for cholecystitis leukocytosis acute kidney injury  Information is obtained from the patient and his wife and daughter who are at the bedside. He reports 3 day history of abdominal pain. States the pain located in the epigastric area initially and then spread to the right upper and lower abdomen. Describes as constant aching-like pain. Worse on the right upper and lower abdomen. Associated symptoms include nausea and vomiting. Reports 10 episodes emesis over last 2 days. One episode coffee ground emesis this am. Reports trying to drink water over the last 2 days but this may be abdominal pain worse and he would vomit shortly thereafter. He denies fever chills headache dizziness syncope or near-syncope. He denies dysuria hematuria frequency or urgency. He denies diarrhea constipation melena bright red blood per rectum. He denies chest pain palpitation shortness of breath.    Assessment & Plan:   Principal Problem:   Cholecystitis Active Problems:   Hypothyroidism   Anxiety state   COPD GOLD 0    Lung nodule   GERD   Abdominal pain   Nausea and vomiting   Acute kidney injury ([place])   Hiatal hernia   Cholecystitis - s/p open cholecystectomy - CT of the abdomen reveals markedly distention of gallbladder with gallbladder wall edema and pericholecystic fluid consistent with acute cholecystitis. Also inflammatory changes no definite stones. He does have a leukocytosis and tachycardia is afebrile. He is provided with IV fluids  Zosyn analgesia anti-emetics. [place] with gallbladder distention/fluid and sludge ball -Continue IV fluids (D5 0.5NS w/ 41mEq KCL) - Clear liquid diet and does not want to advance diet again due to pain -Continue IV analgesia -Continue IV anti emetic -Continue Zosyn - Diluadid IV and PO oxycodone per surgery  Tachycardia likely related to above. Afebrile. -Continue Zosyn -Follow blood cultures (No growth x4 days)  Acute kidney injury. Likely related to above.  -Creatinine 1.3 on admission -IV fluids as noted above -Hold nephrotoxins -Monitor urine output (1450 yesterday) - Creatinine improved this morning  Hypertension. Fair control in the emergency department. Home medications includen amlodipine - Amlodipine PO daily - provide scheduled IV beta blocker with parameters -prn hydralazine -Monitor closely - BP well controlled  COPD. Appears stable at baseline. Oxygen saturation level >90%.  -continue home meds  Lung nodule. Stable. Chart review indicates CT evaluated by pulmonary 9.2017. Continue surveilance per note  Carcinoma of right vocal cord. S/p radiation therapy.      DVT prophylaxis: scd Code Status: full Family Communication: wife and son are bedside Disposition Plan: home   Consultants:   General Surgery  Procedures:  Laparoscopic lysis of adhesions, laparoscopic converted to open subtotal cholecystectomy, wedge biopsy of liver lesion  Antimicrobials:   Zosyn    Subjective: Patient still hiccuping this am.  Says his pain is possibly better but he has not moved much so difficult to say if it is truly better.  Only has had a few sips of liquids.  Does not want to eat more.   Objective: Vitals:   11/02/16 1700 11/02/16 2049 11/03/16 0532 11/03/16 0901  BP: [name] Kitchen)  143/73 (!) 145/76 (!) 150/79   Pulse: 98 89 88   Resp: 14     Temp: 98.7 F (37.1 C) 98.3 F (36.8 C) 98.6 F (37 C)   TempSrc: Oral Oral Oral   SpO2: 96% 96% 98% 95%  Weight:        Height:        Intake/Output Summary (Last 24 hours) at 11/03/16 0937 Last data filed at 11/03/16 0617  Gross per 24 hour  Intake          3826.25 ml  Output             1460 ml  Net          2366.25 ml   Filed Weights   10/30/16 1130 10/31/16 0600 11/01/16 0640  Weight: 83.9 kg (185 lb) (!) 174.6 kg (384 lb 14.8 oz) 84.3 kg (185 lb 12.8 oz)    Examination:  General exam: moderate distress, diaphoretic Respiratory system: Clear to auscultation. Respiratory effort normal. Cardiovascular system: S1 & S2 heard, RRR. No JVD, murmurs, rubs, gallops or clicks. No pedal edema. Gastrointestinal system: normoactive bowel sounds; diffusely tender  Central nervous system: Alert and oriented. No focal neurological deficits. Extremities: Symmetric 5 x 5 power. Skin: no cyanosis, clubbing, edema Psychiatry: Judgement and insight appear normal. Mood & affect appropriate.     Data Reviewed: I have personally reviewed following labs and imaging studies  CBC:  Recent Labs Lab 10/30/16 1051 10/31/16 0512 11/02/16 0349  WBC 30.7* 22.7* 10.7*  NEUTROABS  --   --  8.2*  HGB 15.8 12.7* 10.5*  HCT 46.1 38.5* 31.6*  MCV 90.9 91.2 90.5  PLT 251 234 [id number]   Basic Metabolic Panel:  Recent Labs Lab 10/30/16 1051 10/31/16 0512 11/01/16 0612 11/02/16 0349  NA 132* 133* 136 134*  K 4.3 3.7 3.9 3.8  CL 94* 97* 101 99*  CO2 26 26 26 28   GLUCOSE 127* 113* 120* 106*  BUN 30* 31* 21* 18  CREATININE 1.34* 1.07 0.81 0.75  CALCIUM 9.3 8.3* 8.2* 8.2*   GFR: Estimated Creatinine Clearance: 99.5 mL/min (by C-G formula based on SCr of 0.75 mg/dL). Liver Function Tests:  Recent Labs Lab 10/30/16 1051 11/02/16 0349  AST 25 60*  ALT 26 52  ALKPHOS 23* 20*  BILITOT 1.1 0.4  PROT 7.9 5.9*  ALBUMIN 4.0 2.4*    Recent Labs Lab 10/30/16 1051  LIPASE 17   No results for input(s): AMMONIA in the last 168 hours. Coagulation Profile:  Recent Labs Lab 10/31/16 0512  INR 1.18    Cardiac Enzymes: No results for input(s): CKTOTAL, CKMB, CKMBINDEX, TROPONINI in the last 168 hours. BNP (last 3 results) No results for input(s): PROBNP in the last 8760 hours. HbA1C: No results for input(s): HGBA1C in the last 72 hours. CBG: No results for input(s): GLUCAP in the last 168 hours. Lipid Profile: No results for input(s): CHOL, HDL, LDLCALC, TRIG, CHOLHDL, LDLDIRECT in the last 72 hours. Thyroid Function Tests: No results for input(s): TSH, T4TOTAL, FREET4, T3FREE, THYROIDAB in the last 72 hours. Anemia Panel: No results for input(s): VITAMINB12, FOLATE, FERRITIN, TIBC, IRON, RETICCTPCT in the last 72 hours. Sepsis Labs:  Recent Labs Lab 10/30/16 1604 10/30/16 2009  LATICACIDVEN 2.0* 2.2*    Recent Results (from the past 240 hour(s))  Blood culture (routine x 2)     Status: None (Preliminary result)   Collection Time: 10/30/16 12:49 PM  Result Value Ref Range Status   Specimen Description  BLOOD RIGHT ANTECUBITAL  Final   Special Requests BOTTLES DRAWN AEROBIC AND ANAEROBIC 5 CC  Final   Culture NO GROWTH 3 DAYS  Final   Report Status PENDING  Incomplete  Blood culture (routine x 2)     Status: None (Preliminary result)   Collection Time: 10/30/16 12:49 PM  Result Value Ref Range Status   Specimen Description BLOOD RIGHT HAND  Final   Special Requests BOTTLES DRAWN AEROBIC AND ANAEROBIC 5 CC  Final   Culture NO GROWTH 3 DAYS  Final   Report Status PENDING  Incomplete  Surgical PCR screen     Status: Abnormal   Collection Time: 10/30/16 11:47 PM  Result Value Ref Range Status   MRSA, PCR NEGATIVE NEGATIVE Final   Staphylococcus aureus POSITIVE (A) NEGATIVE Final    Comment:        The Xpert SA Assay (FDA approved for NASAL specimens in patients over 63 years of age), is one component of a comprehensive surveillance program.  Test performance has been validated by [facility] for patients greater than or equal to 57 year old. It is not intended to  diagnose infection nor to guide or monitor treatment.          Radiology Studies: No results found.      Scheduled Meds: . amLODipine  10 mg Oral Daily  . Chlorhexidine Gluconate Cloth  6 each Topical Daily  . DULoxetine  90 mg Oral Daily  . levothyroxine  150 mcg Oral QAC breakfast  . metoprolol  2.5 mg Intravenous Q6H  . mometasone-formoterol  2 puff Inhalation BID  . mupirocin ointment  1 application Nasal BID  . oxyCODONE  7.5 mg Oral QID  . piperacillin-tazobactam (ZOSYN)  IV  3.375 g Intravenous Q8H   Continuous Infusions: . dextrose 5 % and 0.45 % NaCl with KCl 20 mEq/L 75 mL/hr at 11/02/16 1641     LOS: 4 days    Time spent: 30 minutes    Loretha Stapler, MD [facility] Pager [phone]  If 7PM-7AM, please contact night-coverage [email or web address] Password [facility] 11/03/2016, 9:37 AM

## 2016-11-04 DIAGNOSIS — F411 Generalized anxiety disorder: Secondary | ICD-10-CM

## 2016-11-04 DIAGNOSIS — R066 Hiccough: Secondary | ICD-10-CM

## 2016-11-04 LAB — CULTURE, BLOOD (ROUTINE X 2)
CULTURE: NO GROWTH
Culture: NO GROWTH

## 2016-11-04 LAB — CBC
HEMATOCRIT: 29 % — AB (ref 39.0–52.0)
HEMOGLOBIN: 9.4 g/dL — AB (ref 13.0–17.0)
MCH: 29.2 pg (ref 26.0–34.0)
MCHC: 32.4 g/dL (ref 30.0–36.0)
MCV: 90.1 fL (ref 78.0–100.0)
Platelets: 303 10*3/uL (ref 150–400)
RBC: 3.22 MIL/uL — ABNORMAL LOW (ref 4.22–5.81)
RDW: 13.5 % (ref 11.5–15.5)
WBC: 9.1 10*3/uL (ref 4.0–10.5)

## 2016-11-04 MED ORDER — DOCUSATE SODIUM 100 MG PO CAPS
100.0000 mg | ORAL_CAPSULE | Freq: Every day | ORAL | Status: DC | PRN
  Administered 2016-11-04 – 2016-11-05 (×2): 100 mg via ORAL
  Filled 2016-11-04 (×2): qty 1

## 2016-11-04 MED ORDER — SENNOSIDES-DOCUSATE SODIUM 8.6-50 MG PO TABS
1.0000 | ORAL_TABLET | Freq: Two times a day (BID) | ORAL | Status: DC
  Administered 2016-11-04 – 2016-11-06 (×5): 1 via ORAL
  Filled 2016-11-04 (×5): qty 1

## 2016-11-04 NOTE — Progress Notes (Signed)
Nursing staff assisted patient with the use of a front wheel walker to walk from his bed out into the hallway. Patient ambulated without any complaints to the end of the hall from his room. Patient sat in bedside chair after ambulation. Patient complained of no pain during ambulation

## 2016-11-04 NOTE — Progress Notes (Signed)
4 Days Post-Op  Subjective: Tolerating clears, still some hiccups, no BM  Objective: Vital signs in last 24 hours: Temp:  [97.9 F (36.6 C)-98.6 F (37 C)] 98 F (36.7 C) (12/05 0455) Pulse Rate:  [75-95] 83 (12/05 0455) Resp:  [16-18] 18 (12/05 0455) BP: (125-140)/(67-82) 132/82 (12/05 0455) SpO2:  [95 %-97 %] 97 % (12/05 0455) Weight:  [84.4 kg (186 lb)] 84.4 kg (186 lb) (12/05 0500) Last BM Date: 10/29/16  Intake/Output from previous day: 12/04 0701 - 12/05 0700 In: 2346.3 [P.O.:360; I.V.:1886.3; IV Piggyback:100] Out: 420 [Urine:400; Drains:20] Intake/Output this shift: No intake/output data recorded.  General appearance: cooperative Resp: clear to auscultation bilaterally GI: soft, incision CDI, active BS JP ss Lab Results:   Recent Labs  11/02/16 0349 11/04/16 0354  WBC 10.7* 9.1  HGB 10.5* 9.4*  HCT 31.6* 29.0*  PLT 298 303   BMET  Recent Labs  11/02/16 0349 11/03/16 1022  NA 134* 134*  K 3.8 3.7  CL 99* 99*  CO2 28 28  GLUCOSE 106* 117*  BUN 18 14  CREATININE 0.75 0.69  CALCIUM 8.2* 8.2*   PT/INR No results for input(s): LABPROT, INR in the last 72 hours. ABG No results for input(s): PHART, HCO3 in the last 72 hours.  Invalid input(s): PCO2, PO2  Studies/Results: No results found.  Anti-infectives: Anti-infectives    Start     Dose/Rate Route Frequency Ordered Stop   10/30/16 1800  piperacillin-tazobactam (ZOSYN) IVPB 3.375 g     3.375 g 12.5 mL/hr over 240 Minutes Intravenous Every 8 hours 10/30/16 1514     10/30/16 1145  piperacillin-tazobactam (ZOSYN) IVPB 3.375 g     3.375 g 100 mL/hr over 30 Minutes Intravenous  Once 10/30/16 1138 10/30/16 1359      Assessment/Plan: OPEN CHOLECYSTECTOMY - 10/31/2016 - [place]  RUQ drain - will go home with this to be removed in the office by Dr. Donne Hazel. 1.  Advance to fulls 2.  DVT prophylaxis - lovenox 3.  COPD 4.  Pain control - oxy PO 5.  Hiccups - continue Thorazine  LOS: 5 days     Sari Cogan E 11/04/2016

## 2016-11-04 NOTE — Progress Notes (Signed)
PT Cancellation Note  Patient Details Name: [name] MRN: [id number] DOB: 15-Jan-1946   Cancelled Treatment:    Reason Eval/Treat Not Completed: Fatigue/lethargy limiting ability to participate.  Patient reports he has been in hallway with nursing and sitting in chair.  Reports he just returned to bed and is fatigued.  Requested PT be deferred until tomorrow.   Despina Pole 11/04/2016, 4:45 PM Carita Pian. Sanjuana Kava, [place] Pager [phone]

## 2016-11-04 NOTE — Consult Note (Signed)
[facility] CM Primary Care Navigator  11/04/2016  [name] 1946/07/05 [id number]   Met with patient and wife (Nell) at the bedside to identify possible discharge needs. Patient reports that worsening pain to abdomen with nausea/ vomiting had led to this admission/surgery.  Patient endorses Dr. Carol Ada with [facility] as the primary care provider.   Patient shared using [facility] in [place] to obtain medications without any problem.   Patient reports that he manages his medications at home with wife's assistance, straight out of the containers.   He is able to drive prior to this admission. Wife will provide transportation to his doctors' appointments after discharge.  Wife is the primary caregiver at home and adult children will be able to assist with care needs if needed.  Discharge plan is home with wife's assistance per patient.  Patient and wife voiced understanding to call primary care provider's office when he gets home, for a post discharge follow-up appointment within a week or sooner if needs arise. Patient letter provided as a reminder.  Patient and wife denied any other needs or concerns at this time.  For additional questions please contact:  Edwena Felty A. Desirre Eickhoff, BSN, RN-BC [facility] PRIMARY CARE Navigator Cell: [phone]

## 2016-11-04 NOTE — Progress Notes (Signed)
PROGRESS NOTE    [name]  [id number] DOB: September 29, 1946 DOA: 10/30/2016 PCP: Reginia Naas, MD    Brief Narrative:  [name] is a 70 y.o. 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 Initial evaluation includes CT of the abdomen concerning for cholecystitis leukocytosis acute kidney injury  Information is obtained from the patient and his wife and daughter who are at the bedside. He reports 3 day history of abdominal pain. States the pain located in the epigastric area initially and then spread to the right upper and lower abdomen. Describes as constant aching-like pain. Worse on the right upper and lower abdomen. Associated symptoms include nausea and vomiting. Reports 10 episodes emesis over last 2 days. One episode coffee ground emesis this am. Reports trying to drink water over the last 2 days but this may be abdominal pain worse and he would vomit shortly thereafter. He denies fever chills headache dizziness syncope or near-syncope. He denies dysuria hematuria frequency or urgency. He denies diarrhea constipation melena bright red blood per rectum. He denies chest pain palpitation shortness of breath.  Underwent open cholecystectomy and pain was hard to control post operatively.      Assessment & Plan:   Principal Problem:   Cholecystitis Active Problems:   Hypothyroidism   Anxiety state   COPD GOLD 0    Lung nodule   GERD   Abdominal pain   Nausea and vomiting   Acute kidney injury ([place])   Hiatal hernia   Cholecystitis - s/p open cholecystectomy - CT of the abdomen reveals markedly distention of gallbladder with gallbladder wall edema and pericholecystic fluid consistent with acute cholecystitis. Also inflammatory changes no definite stones. He  does have a leukocytosis and tachycardia is afebrile. He is provided with IV fluids Zosyn analgesia anti-emetics. [place] with gallbladder distention/fluid and sludge ball -Continue IV fluids (D5 0.5NS w/ 51mEq KCL)- will expire today - Full liquid diet -Continue IV analgesia -Continue IV anti emetic -Continue Zosyn - Diluadid IV and PO oxycodone per surgery - pain better controlled today - thorazine for hiccups  Tachycardia likely related to above. Afebrile. -Continue Zosyn -Follow blood cultures (No growth x5 days)  Acute kidney injury. Likely related to above.  -Creatinine 1.3 on admission -IV fluids as noted above -Hold nephrotoxins -Monitor urine output (400 yesterday) - Creatinine improved this morning  Hypertension. Fair control in the emergency department. Home medications includen amlodipine - Amlodipine PO daily - provide scheduled IV beta blocker with parameters -prn hydralazine -Monitor closely - BP well controlled  COPD. Appears stable at baseline. Oxygen saturation level >90%.  -continue home meds  Lung nodule. Stable. Chart review indicates CT evaluated by pulmonary 9.2017. Continue surveilance per note  Carcinoma of right vocal cord. S/p radiation therapy.      DVT prophylaxis: scd Code Status: full Family Communication: wife and son are bedside Disposition Plan: home when cleared by surgery, pain is well controlled and tolerating diet- anticipate 2-3 more days  Consultants:   General Surgery  PT/ OT  Procedures:  Laparoscopic lysis of adhesions, laparoscopic converted to open subtotal cholecystectomy, wedge biopsy of liver lesion  Antimicrobials:   Zosyn    Subjective: Still having some hiccups.  Pain is better controlled.  Patient anxious to start full liquid diet.   Objective: Vitals:   11/04/16 0455 11/04/16 0500 11/04/16  1140 11/04/16 1300  BP: 132/82  130/68 128/64  Pulse: 83  82 82  Resp: 18   16  Temp: 98 F (36.7 C)    98.1 F (36.7 C)  TempSrc: Oral   Oral  SpO2: 97%   97%  Weight:  84.4 kg (186 lb)    Height:        Intake/Output Summary (Last 24 hours) at 11/04/16 1544 Last data filed at 11/04/16 1300  Gross per 24 hour  Intake          2346.25 ml  Output              870 ml  Net          1476.25 ml   Filed Weights   10/31/16 0600 11/01/16 0640 11/04/16 0500  Weight: (!) 174.6 kg (384 lb 14.8 oz) 84.3 kg (185 lb 12.8 oz) 84.4 kg (186 lb)    Examination:  General exam: minimal distress Respiratory system: Clear to auscultation. Respiratory effort normal. Cardiovascular system: S1 & S2 heard, RRR. No JVD, murmurs, rubs, gallops or clicks. No pedal edema. Gastrointestinal system: normoactive bowel sounds; diffusely tender, incision sites clean, dry, intact, no erythema around staples  Central nervous system: Alert and oriented. No focal neurological deficits. Extremities: Symmetric 5 x 5 power. Skin: no cyanosis, clubbing, edema Psychiatry: Judgement and insight appear normal. Mood & affect appropriate.     Data Reviewed: I have personally reviewed following labs and imaging studies  CBC:  Recent Labs Lab 10/30/16 1051 10/31/16 0512 11/02/16 0349 11/04/16 0354  WBC 30.7* 22.7* 10.7* 9.1  NEUTROABS  --   --  8.2*  --   HGB 15.8 12.7* 10.5* 9.4*  HCT 46.1 38.5* 31.6* 29.0*  MCV 90.9 91.2 90.5 90.1  PLT 251 234 298 [id number]   Basic Metabolic Panel:  Recent Labs Lab 10/30/16 1051 10/31/16 0512 11/01/16 0612 11/02/16 0349 11/03/16 1022  NA 132* 133* 136 134* 134*  K 4.3 3.7 3.9 3.8 3.7  CL 94* 97* 101 99* 99*  CO2 26 26 26 28 28   GLUCOSE 127* 113* 120* 106* 117*  BUN 30* 31* 21* 18 14  CREATININE 1.34* 1.07 0.81 0.75 0.69  CALCIUM 9.3 8.3* 8.2* 8.2* 8.2*   GFR: Estimated Creatinine Clearance: 99.6 mL/min (by C-G formula based on SCr of 0.69 mg/dL). Liver Function Tests:  Recent Labs Lab 10/30/16 1051 11/02/16 0349  AST 25 60*  ALT 26 52  ALKPHOS 23* 20*  BILITOT  1.1 0.4  PROT 7.9 5.9*  ALBUMIN 4.0 2.4*    Recent Labs Lab 10/30/16 1051  LIPASE 17   No results for input(s): AMMONIA in the last 168 hours. Coagulation Profile:  Recent Labs Lab 10/31/16 0512  INR 1.18   Cardiac Enzymes: No results for input(s): CKTOTAL, CKMB, CKMBINDEX, TROPONINI in the last 168 hours. BNP (last 3 results) No results for input(s): PROBNP in the last 8760 hours. HbA1C: No results for input(s): HGBA1C in the last 72 hours. CBG: No results for input(s): GLUCAP in the last 168 hours. Lipid Profile: No results for input(s): CHOL, HDL, LDLCALC, TRIG, CHOLHDL, LDLDIRECT in the last 72 hours. Thyroid Function Tests: No results for input(s): TSH, T4TOTAL, FREET4, T3FREE, THYROIDAB in the last 72 hours. Anemia Panel: No results for input(s): VITAMINB12, FOLATE, FERRITIN, TIBC, IRON, RETICCTPCT in the last 72 hours. Sepsis Labs:  Recent Labs Lab 10/30/16 1604 10/30/16 2009  LATICACIDVEN 2.0* 2.2*    Recent Results (from the past 240 hour(s))  Blood culture (routine x 2)     Status: None   Collection Time: 10/30/16 12:49 PM  Result Value Ref Range Status   Specimen Description BLOOD RIGHT ANTECUBITAL  Final   Special Requests BOTTLES DRAWN AEROBIC AND ANAEROBIC 5 CC  Final   Culture NO GROWTH 5 DAYS  Final   Report Status 11/04/2016 FINAL  Final  Blood culture (routine x 2)     Status: None   Collection Time: 10/30/16 12:49 PM  Result Value Ref Range Status   Specimen Description BLOOD RIGHT HAND  Final   Special Requests BOTTLES DRAWN AEROBIC AND ANAEROBIC 5 CC  Final   Culture NO GROWTH 5 DAYS  Final   Report Status 11/04/2016 FINAL  Final  Surgical PCR screen     Status: Abnormal   Collection Time: 10/30/16 11:47 PM  Result Value Ref Range Status   MRSA, PCR NEGATIVE NEGATIVE Final   Staphylococcus aureus POSITIVE (A) NEGATIVE Final    Comment:        The Xpert SA Assay (FDA approved for NASAL specimens in patients over 39 years of age), is  one component of a comprehensive surveillance program.  Test performance has been validated by [facility] for patients greater than or equal to 57 year old. It is not intended to diagnose infection nor to guide or monitor treatment.          Radiology Studies: No results found.      Scheduled Meds: . amLODipine  10 mg Oral Daily  . Chlorhexidine Gluconate Cloth  6 each Topical Daily  . DULoxetine  90 mg Oral Daily  . levothyroxine  150 mcg Oral QAC breakfast  . metoprolol  2.5 mg Intravenous Q6H  . mometasone-formoterol  2 puff Inhalation BID  . mupirocin ointment  1 application Nasal BID  . oxyCODONE  7.5 mg Oral QID  . piperacillin-tazobactam (ZOSYN)  IV  3.375 g Intravenous Q8H  . senna-docusate  1 tablet Oral BID   Continuous Infusions: . dextrose 5 % and 0.45 % NaCl with KCl 20 mEq/L 75 mL/hr at 11/03/16 2050     LOS: 5 days    Time spent: 30 minutes    Loretha Stapler, MD [facility] Pager [phone]  If 7PM-7AM, please contact night-coverage [email or web address] Password [facility] 11/04/2016, 3:44 PM

## 2016-11-05 DIAGNOSIS — R911 Solitary pulmonary nodule: Secondary | ICD-10-CM

## 2016-11-05 DIAGNOSIS — J449 Chronic obstructive pulmonary disease, unspecified: Secondary | ICD-10-CM

## 2016-11-05 DIAGNOSIS — N179 Acute kidney failure, unspecified: Secondary | ICD-10-CM

## 2016-11-05 DIAGNOSIS — K819 Cholecystitis, unspecified: Secondary | ICD-10-CM

## 2016-11-05 LAB — CBC
HCT: 29.8 % — ABNORMAL LOW (ref 39.0–52.0)
Hemoglobin: 9.8 g/dL — ABNORMAL LOW (ref 13.0–17.0)
MCH: 29.9 pg (ref 26.0–34.0)
MCHC: 32.9 g/dL (ref 30.0–36.0)
MCV: 90.9 fL (ref 78.0–100.0)
PLATELETS: 398 10*3/uL (ref 150–400)
RBC: 3.28 MIL/uL — ABNORMAL LOW (ref 4.22–5.81)
RDW: 13.8 % (ref 11.5–15.5)
WBC: 9.5 10*3/uL (ref 4.0–10.5)

## 2016-11-05 MED ORDER — POLYETHYLENE GLYCOL 3350 17 G PO PACK
17.0000 g | PACK | Freq: Two times a day (BID) | ORAL | Status: AC
  Administered 2016-11-05 (×2): 17 g via ORAL
  Filled 2016-11-05 (×2): qty 1

## 2016-11-05 MED ORDER — MAGNESIUM CITRATE PO SOLN
1.0000 | Freq: Once | ORAL | Status: AC | PRN
  Administered 2016-11-05: 1 via ORAL
  Filled 2016-11-05: qty 296

## 2016-11-05 MED ORDER — HYDROMORPHONE HCL 2 MG/ML IJ SOLN
1.5000 mg | INTRAMUSCULAR | Status: DC | PRN
  Administered 2016-11-05 – 2016-11-06 (×4): 1.5 mg via INTRAVENOUS
  Filled 2016-11-05 (×4): qty 1

## 2016-11-05 NOTE — Progress Notes (Signed)
Patient ID: [name], male   DOB: 06-Mar-1946, 70 y.o.   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 CT of the abdomen was concerning for cholecystitis.  Information is obtained from the patient and his wife and daughter who are at the bedside. He reports 3 day history of abdominal pain. States the pain located in the epigastric area initially and then spread to the right upper and lower abdomen. Describes asconstant aching-like pain. Worse on the right upper and lower abdomen. Associated symptoms include nausea and vomiting. Reports 10 episodes emesis over last 2 days. One episode coffee ground emesis this am. Reports trying to drink water over the last 2 days but this may be abdominal pain worse and he would vomit shortly thereafter. He denies fever chills headache dizziness syncope or near-syncope. He denies dysuria hematuria frequency or urgency. He denies diarrhea constipation melena bright red blood per rectum. He denies chest pain palpitation shortness of breath.  Underwent open cholecystectomy and pain was hard to control post operatively.  Assessment & Plan:  Acute cholecystitis - CT scan on admission showed marked distention of the gallbladder with gallbladder wall edema and pericholecystic fluid, findings most consistent with acute cholecystitis. No definite gallstones were seen - Abd US showed acute cholecystitis  - S/p open cholecystectomy 10/31/2016 - Blood cx so far negative  - Also has RUQ drain and he will continue this on discharge and follow in office with Dr. Donne Hazel  - Continue zosyn  - Diet full liquids and  surgery to advance further - Continue pain management efforts  - Continue thorazine for hiccups  Acute kidney injury - Likely due to cholecystitis - Cr now WNL - Cr improved with fluids   Essential hypertension  - Controlled with Norvasc and metoprolol    COPD - Appears stable at baseline. Oxygen saturation level >90%.   Lung nodule - Stable - CT evaluated by pulmonary 9.2017. 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   Anemia of chronic disease - Due to pt history of malignancy - Hgb stable    DVT prophylaxis:SCD's bilaterally  Code Status:full Family Communication:family not at the bedside this am Disposition Plan:home once cleared by surgery    Consultants:   General Surgery  PT/ OT  Procedures:   Laparoscopic lysis of adhesions, laparoscopic converted to open subtotal cholecystectomy, wedge biopsy of liver lesion  Antimicrobials:   Zosyn       Subjective: No overnight events.   Objective: Vitals:   11/04/16 2059 11/05/16 0445 11/05/16 0853 11/05/16 1300  BP: 114/65 133/69  116/70  Pulse: 78 75 75 84  Resp: 16 16 17 16   Temp: 98.3 F (36.8 C) 97.9 F (36.6 C)  98.5 F (36.9 C)  TempSrc: Oral Oral  Oral  SpO2: 97% 96% 95% 97%  Weight:      Height:        Intake/Output Summary (Last 24 hours) at 11/05/16 1514 Last data filed at 11/05/16 1300  Gross per 24 hour  Intake          1203.75 ml  Output  980 ml  Net           223.75 ml   Filed Weights   10/31/16 0600 11/01/16 0640 11/04/16 0500  Weight: (!) 174.6 kg (384 lb 14.8 oz) 84.3 kg (185 lb 12.8 oz) 84.4 kg (186 lb)    Examination:  General exam: Appears calm and comfortable  Respiratory system: Clear to auscultation. Respiratory effort normal. Cardiovascular system: S1 & S2 heard, RRR. No pedal edema. Gastrointestinal system: Drain in place, abdomen non tender, appreciate bowel sounds  Central nervous system:  Alert and oriented. No focal neurological deficits. Extremities: Symmetric 5 x 5 power. Skin: No rashes, lesions or ulcers Psychiatry: Judgement and insight appear normal. Mood & affect appropriate.   Data Reviewed: I have personally reviewed following labs and imaging studies  CBC:  Recent Labs Lab 10/30/16 1051 10/31/16 0512 11/02/16 0349 11/04/16 0354 11/05/16 1001  WBC 30.7* 22.7* 10.7* 9.1 9.5  NEUTROABS  --   --  8.2*  --   --   HGB 15.8 12.7* 10.5* 9.4* 9.8*  HCT 46.1 38.5* 31.6* 29.0* 29.8*  MCV 90.9 91.2 90.5 90.1 90.9  PLT 251 234 298 [id number]   Basic Metabolic Panel:  Recent Labs Lab 10/30/16 1051 10/31/16 0512 11/01/16 0612 11/02/16 0349 11/03/16 1022  NA 132* 133* 136 134* 134*  K 4.3 3.7 3.9 3.8 3.7  CL 94* 97* 101 99* 99*  CO2 26 26 26 28 28   GLUCOSE 127* 113* 120* 106* 117*  BUN 30* 31* 21* 18 14  CREATININE 1.34* 1.07 0.81 0.75 0.69  CALCIUM 9.3 8.3* 8.2* 8.2* 8.2*   GFR: Estimated Creatinine Clearance: 99.6 mL/min (by C-G formula based on SCr of 0.69 mg/dL). Liver Function Tests:  Recent Labs Lab 10/30/16 1051 11/02/16 0349  AST 25 60*  ALT 26 52  ALKPHOS 23* 20*  BILITOT 1.1 0.4  PROT 7.9 5.9*  ALBUMIN 4.0 2.4*    Recent Labs Lab 10/30/16 1051  LIPASE 17   No results for input(s): AMMONIA in the last 168 hours. Coagulation Profile:  Recent Labs Lab 10/31/16 0512  INR 1.18   Cardiac Enzymes: No results for input(s): CKTOTAL, CKMB, CKMBINDEX, TROPONINI in the last 168 hours. BNP (last 3 results) No results for input(s): PROBNP in the last 8760 hours. HbA1C: No results for input(s): HGBA1C in the last 72 hours. CBG: No results for input(s): GLUCAP in the last 168 hours. Lipid Profile: No results for input(s): CHOL, HDL, LDLCALC, TRIG, CHOLHDL, LDLDIRECT in the last 72 hours. Thyroid Function Tests: No results for input(s): TSH, T4TOTAL, FREET4, T3FREE, THYROIDAB in the last 72 hours. Anemia Panel: No results for  input(s): VITAMINB12, FOLATE, FERRITIN, TIBC, IRON, RETICCTPCT in the last 72 hours. Urine analysis:    Component Value Date/Time   COLORURINE YELLOW 10/30/2016 [place] 10/30/2016 1359   LABSPEC 1.020 10/30/2016 1359   PHURINE 5.5 10/30/2016 1359   GLUCOSEU NEGATIVE 10/30/2016 1359   HGBUR NEGATIVE 10/30/2016 1359   BILIRUBINUR SMALL (A) 10/30/2016 1359   KETONESUR NEGATIVE 10/30/2016 1359   PROTEINUR 30 (A) 10/30/2016 1359   UROBILINOGEN 0.2 01/15/2013 1043   NITRITE NEGATIVE 10/30/2016 1359   LEUKOCYTESUR NEGATIVE 10/30/2016 1359   Sepsis Labs: @LABRCNTIP (procalcitonin:4,lacticidven:4)   Blood culture (routine x 2)     Status: None   Collection Time: 10/30/16 12:49 PM  Result Value Ref Range Status   Specimen Description BLOOD RIGHT ANTECUBITAL  Final   Special Requests BOTTLES DRAWN AEROBIC AND ANAEROBIC 5 CC  Final   Culture NO GROWTH 5 DAYS  Final   Report Status 11/04/2016 FINAL  Final  Blood culture (routine x 2)     Status: None   Collection Time: 10/30/16 12:49 PM  Result Value Ref Range Status   Specimen Description BLOOD RIGHT HAND  Final   Special Requests BOTTLES DRAWN AEROBIC AND ANAEROBIC 5 CC  Final   Culture NO GROWTH 5 DAYS  Final   Report Status 11/04/2016 FINAL  Final  Surgical PCR screen     Status: Abnormal   Collection Time: 10/30/16 11:47 PM  Result Value Ref Range Status   MRSA, PCR NEGATIVE NEGATIVE Final   Staphylococcus aureus POSITIVE (A) NEGATIVE Final      Radiology Studies:  Ct Abdomen Pelvis W Contrast Result Date: 10/30/2016 1. Marked distention of the gallbladder with gallbladder wall edema and pericholecystic fluid, findings most consistent with acute cholecystitis. There are surrounding inflammatory changes as well. No definite gallstones are seen. 2. Stable noncalcified nodule in the left lower lobe compared to the CT from 2014 suggesting a benign process. 3. Small hiatal hernia. 4. Multiple calcified splenic  granulomas from prior granulomatous disease. 5. Significant Li enlarged prostate indenting the urinary bladder. 6. Moderate abdominal aortic atherosclerosis.   Dg Chest Port 1 View Result Date: 10/30/2016 No active disease. Electronically Signed   By: Margarette Canada M.D.   On: 10/30/2016 16:45   US Abdomen Limited Ruq Result Date: 10/30/2016 Findings most consistent with acute cholecystitis. The gallbladder is distended and shows pericholecystic fluid. Large tumefactive sludge ball within the gallbladder measuring up to 10 cm in diameter. Electronically Signed   By: Nelson Chimes M.D.   On: 10/30/2016 15:22      Scheduled Meds: . amLODipine  10 mg Oral Daily  . DULoxetine  90 mg Oral Daily  . levothyroxine  150 mcg Oral QAC breakfast  . metoprolol  2.5 mg Intravenous Q6H  . mometasone-formoterol  2 puff Inhalation BID  . oxyCODONE  7.5 mg Oral QID  . piperacillin-tazobactam (ZOSYN)  IV  3.375 g Intravenous Q8H  . polyethylene glycol  17 g Oral BID  . senna-docusate  1 tablet Oral BID   Continuous Infusions: . dextrose 5 % and 0.45 % NaCl with KCl 20 mEq/L 75 mL/hr at 11/04/16 1805     LOS: 6 days    Time spent: 25 minutes  Greater than 50% of the time spent on counseling and coordinating the care.   Leisa Lenz, MD [facility] Pager [phone]  If 7PM-7AM, please contact night-coverage [email or web address] Password [email or web address] 11/05/2016, 3:14 PM

## 2016-11-05 NOTE — Progress Notes (Signed)
Patient ID: [name], male   DOB: 09-12-1946, 70 y.o.   MRN: [id number]  [facility] Surgery Progress Note  5 Days Post-Op  Subjective: Feeling better today than yesterday. Continues to have hiccups, thorazine helps a little. Has not had BM since admission. Tolerating full liquid diet. Denies n/v. Ambulated yesterday, plans to work with PT today.  Objective: Vital signs in last 24 hours: Temp:  [97.9 F (36.6 C)-98.3 F (36.8 C)] 97.9 F (36.6 C) (12/06 0445) Pulse Rate:  [75-82] 75 (12/06 0853) Resp:  [16-17] 17 (12/06 0853) BP: (114-133)/(64-69) 133/69 (12/06 0445) SpO2:  [95 %-97 %] 95 % (12/06 0853) Last BM Date: 10/29/16  Intake/Output from previous day: 12/05 0701 - 12/06 0700 In: 1203.8 [P.O.:360; I.V.:743.8; IV Piggyback:100] Out: 1260 [Urine:1250; Drains:10] Intake/Output this shift: No intake/output data recorded.  PE: Gen:  Alert, NAD, pleasant Card:  RRR, no M/G/R heard Pulm:  CTAB, no W/R/R Abd: Soft, ND, appropriately tender, +BS, incisions C/D/I with staples intact, drain with minimal serosanguinous drainage  Lab Results:   Recent Labs  11/04/16 0354  WBC 9.1  HGB 9.4*  HCT 29.0*  PLT 303   BMET  Recent Labs  11/03/16 1022  NA 134*  K 3.7  CL 99*  CO2 28  GLUCOSE 117*  BUN 14  CREATININE 0.69  CALCIUM 8.2*   PT/INR No results for input(s): LABPROT, INR in the last 72 hours. CMP     Component Value Date/Time   NA 134 (L) 11/03/2016 1022   K 3.7 11/03/2016 1022   CL 99 (L) 11/03/2016 1022   CO2 28 11/03/2016 1022   GLUCOSE 117 (H) 11/03/2016 1022   BUN 14 11/03/2016 1022   CREATININE 0.69 11/03/2016 1022   CALCIUM 8.2 (L) 11/03/2016 1022   PROT 5.9 (L) 11/02/2016 0349   ALBUMIN 2.4 (L) 11/02/2016 0349   AST 60 (H) 11/02/2016 0349   ALT 52 11/02/2016 0349   ALKPHOS 20 (L) 11/02/2016 0349   BILITOT 0.4 11/02/2016 0349   GFRNONAA >60 11/03/2016 1022   GFRAA >60 11/03/2016 1022   Lipase     Component Value  Date/Time   LIPASE 17 10/30/2016 1051       Studies/Results: No results found.  Anti-infectives: Anti-infectives    Start     Dose/Rate Route Frequency Ordered Stop   10/30/16 1800  piperacillin-tazobactam (ZOSYN) IVPB 3.375 g     3.375 g 12.5 mL/hr over 240 Minutes Intravenous Every 8 hours 10/30/16 1514     10/30/16 1145  piperacillin-tazobactam (ZOSYN) IVPB 3.375 g     3.375 g 100 mL/hr over 30 Minutes Intravenous  Once 10/30/16 1138 10/30/16 1359       Assessment/Plan Laparoscopic lysis of adhesions, laparoscopic converted to open subtotal cholecystectomy, wedge biopsy of liver lesion 12/1 Dr. Donne Hazel - POD 5 - drain 30cc/24hr serosanguinous  COPD HTN Carcinoma of right vocal cord. S/p radiation therapy  ID - zosyn 11/30>> FEN - soft, PO pain meds VTE - SCDs  Plan - CBC pending. advance to soft diet. Add miralax BID today for bowel regimen. Continue PT/mobilization. Possibly home tomorrow if doing well. Will keep drain at discharge and follow-up in 1 week with Dr. Donne Hazel.   LOS: 6 days    Jerrye Beavers , [facility] Surgery 11/05/2016, 9:37 AM Pager: [phone] Consults: [phone] Mon-Fri 7:00 am-4:30 pm Sat-Sun 7:00 am-11:30 am

## 2016-11-05 NOTE — Progress Notes (Signed)
OT Cancellation Note  Patient Details Name: [name] MRN: [id number] DOB: 1946-06-25   Cancelled Treatment:    Reason Eval/Treat Not Completed: Pain limiting ability to participate. Pt just got pain meds and requested therapy return later. OT will return later as able or tomorrow  Britt Bottom 11/05/2016, 2:35 PM

## 2016-11-05 NOTE — Progress Notes (Signed)
PT Cancellation Note  Patient Details Name: [name] MRN: [id number] DOB: [birth year]   Cancelled Treatment:    Reason Eval/Treat Not Completed: Pain limiting ability to participate;Other (comment) (nausea).  Pt is in pain (RN in room giving pain meds) and nauseated (having recently vomited in his emesis bag).  He is agreeable for PT to check back in the AM.  I will check in earlier in the day so that we can check in several times if needed to get him moving.    Thanks,    Barbarann Ehlers. [place], [place], DPT [phone]   11/05/2016, 4:22 PM

## 2016-11-06 DIAGNOSIS — E039 Hypothyroidism, unspecified: Secondary | ICD-10-CM

## 2016-11-06 LAB — CBC
HEMATOCRIT: 28.9 % — AB (ref 39.0–52.0)
HEMOGLOBIN: 9.3 g/dL — AB (ref 13.0–17.0)
MCH: 29.2 pg (ref 26.0–34.0)
MCHC: 32.2 g/dL (ref 30.0–36.0)
MCV: 90.9 fL (ref 78.0–100.0)
Platelets: 453 10*3/uL — ABNORMAL HIGH (ref 150–400)
RBC: 3.18 MIL/uL — ABNORMAL LOW (ref 4.22–5.81)
RDW: 13.9 % (ref 11.5–15.5)
WBC: 8.2 10*3/uL (ref 4.0–10.5)

## 2016-11-06 LAB — BASIC METABOLIC PANEL
Anion gap: 8 (ref 5–15)
BUN: 11 mg/dL (ref 6–20)
CHLORIDE: 96 mmol/L — AB (ref 101–111)
CO2: 30 mmol/L (ref 22–32)
CREATININE: 0.73 mg/dL (ref 0.61–1.24)
Calcium: 8.3 mg/dL — ABNORMAL LOW (ref 8.9–10.3)
GFR calc Af Amer: >60 mL/min (ref >60)
GFR calc non Af Amer: >60 mL/min (ref >60)
Glucose, Bld: 95 mg/dL (ref 65–99)
Potassium: 4.6 mmol/L (ref 3.5–5.1)
SODIUM: 134 mmol/L — AB (ref 135–145)

## 2016-11-06 MED ORDER — OXYCODONE HCL 15 MG PO TABS: 7.5000 mg | ORAL_TABLET | Freq: Four times a day (QID) | ORAL | 0 refills | Status: DC

## 2016-11-06 MED ORDER — CHLORPROMAZINE HCL 25 MG PO TABS: 25.0000 mg | ORAL_TABLET | Freq: Three times a day (TID) | ORAL | 0 refills | Status: DC | PRN

## 2016-11-06 MED ORDER — AMOXICILLIN-POT CLAVULANATE 875-125 MG PO TABS: 1.0000 | ORAL_TABLET | Freq: Two times a day (BID) | ORAL | 0 refills | Status: DC

## 2016-11-06 MED ORDER — BACLOFEN 10 MG PO TABS
5.0000 mg | ORAL_TABLET | Freq: Three times a day (TID) | ORAL | Status: DC
  Administered 2016-11-06: 5 mg via ORAL
  Filled 2016-11-06: qty 1

## 2016-11-06 NOTE — Evaluation (Signed)
Occupational Therapy Evaluation Patient Details Name: [name] MRN: [id number] DOB: 1946/05/17 Today's Date: 11/06/2016    History of Present Illness Pt is a 70 y.o. male s/p laparoscopic lysis of adherions, laparoscopic converted to open subtotal cholecystectomy, edge biopsy of liver lesion on 10/31/16 after presenting to the ED with abdominal pain, nausea and vomiting due to acute cholecystitis. Pt has a PMH significant for: hypothyroid, depression, vocal cord cancer, and hypertension.     Clinical Impression   PTA, pt was independent with ADL and functional mobility. Pt currently requires min guard assist for ADL and was able to progress to min guard assist +2 for safety/equipment with functional mobility. Pt demonstrates significantly decreased activity tolerance with ADL and would benefit from continued OT services to improve independence with ADL and functional mobility as well as to address activity tolerance and energy conservation. OT will continue to follow acutely. Recommend D/C home with home health OT services and 3-in-1 BSC.     Follow Up Recommendations  Home health OT;Supervision/Assistance - 24 hour    Equipment Recommendations  3 in 1 bedside commode    Recommendations for Other Services       Precautions / Restrictions Precautions Precautions: Fall Restrictions Weight Bearing Restrictions: No      Mobility Bed Mobility Overal bed mobility: Needs Assistance Bed Mobility: Supine to Sit;Sit to Supine     Supine to sit: Min guard Sit to supine: Min guard      Transfers Overall transfer level: Needs assistance   Transfers: Sit to/from Stand Sit to Stand: Min assist;+2 safety/equipment              Balance Overall balance assessment: Needs assistance Sitting-balance support: No upper extremity supported;Feet supported Sitting balance-Leahy Scale: Good     Standing balance support: Bilateral upper extremity supported;During functional  activity Standing balance-Leahy Scale: Poor Standing balance comment: Reliant on BUE support on RW.                            ADL Overall ADL's : Needs assistance/impaired     Grooming: Set up;Sitting   Upper Body Bathing: Set up;Sitting   Lower Body Bathing: Min guard;Sit to/from stand   Upper Body Dressing : Sitting;Set up   Lower Body Dressing: Min guard;Sit to/from stand   Toilet Transfer: Min guard;Minimal assistance;Ambulation;RW;+2 for safety/equipment   Toileting- Clothing Manipulation and Hygiene: Min guard;Sit to/from stand       Functional mobility during ADLs: Min guard;Minimal assistance;Rolling walker;+2 for safety/equipment General ADL Comments: Able to progress from min assist +2 to min guard +2 for safety. Pt educated on ADL techniques and energy conservation.     Vision Vision Assessment?: No apparent visual deficits   Perception     Praxis      Pertinent Vitals/Pain Pain Assessment: Faces Faces Pain Scale: Hurts even more Pain Location: abdomen Pain Descriptors / Indicators: Sore Pain Intervention(s): Limited activity within patient's tolerance;Monitored during session;Repositioned     Hand Dominance Right   Extremity/Trunk Assessment Upper Extremity Assessment Upper Extremity Assessment: Generalized weakness   Lower Extremity Assessment Lower Extremity Assessment: Defer to PT evaluation   Cervical / Trunk Assessment Cervical / Trunk Assessment: Kyphotic   Communication Communication Communication: No difficulties   Cognition Arousal/Alertness: Awake/alert Behavior During Therapy: WFL for tasks assessed/performed Overall Cognitive Status: Within Functional Limits for tasks assessed  General Comments       Exercises       Shoulder Instructions      Home Living Family/patient expects to be discharged to:: Private residence Living Arrangements: Spouse/significant other Available Help at  Discharge: Family;Available 24 hours/day Type of Home: House Home Access: Stairs to enter [employer] of Steps: 1 Entrance Stairs-Rails: None Home Layout: One level     Bathroom Shower/Tub: Walk-in shower;Other (comment) (small step into shower)   Bathroom Toilet: Standard     Home Equipment: None          Prior Functioning/Environment Level of Independence: Independent        Comments: was completely indendent and driving        OT Problem List: Decreased strength;Decreased range of motion;Decreased activity tolerance;Impaired balance (sitting and/or standing);Decreased safety awareness;Decreased knowledge of use of DME or AE;Decreased knowledge of precautions;Pain   OT Treatment/Interventions: Self-care/ADL training;Therapeutic exercise;Energy conservation;Therapeutic activities;Patient/family education;Balance training    OT Goals(Current goals can be found in the care plan section) Acute Rehab OT Goals Patient Stated Goal: to go home and feel better OT Goal Formulation: With patient Time For Goal Achievement: 11/20/16 Potential to Achieve Goals: Good ADL Goals Pt Will Perform Lower Body Bathing: with modified independence;sit to/from stand Pt Will Perform Lower Body Dressing: with modified independence;sit to/from stand Pt Will Transfer to Toilet: with modified independence;ambulating Pt Will Perform Toileting - Clothing Manipulation and hygiene: with modified independence;sit to/from stand Pt Will Perform Tub/Shower Transfer: with modified independence;Shower transfer;3 in 1;rolling walker Additional ADL Goal #1: Pt will independently report 3 energy conservation techniques and demonstrate these during 15 minute ADL session.  OT Frequency: Min 2X/week   Barriers to D/C:            Co-evaluation PT/OT/SLP Co-Evaluation/Treatment: Yes Reason for Co-Treatment: For patient/therapist safety PT goals addressed during session: Mobility/safety with  mobility OT goals addressed during session: ADL's and self-care      End of Session Equipment Utilized During Treatment: Gait belt;Rolling walker  Activity Tolerance: Patient tolerated treatment well Patient left: in chair;with call bell/phone within reach   Time: 0910-0934 OT Time Calculation (min): 24 min Charges:  OT General Charges $OT Visit: 1 Procedure OT Evaluation $OT Eval Moderate Complexity: 1 Procedure  Norman Herrlich, OTR/L [phone] 11/06/2016, 11:55 AM

## 2016-11-06 NOTE — Discharge Summary (Addendum)
Physician Discharge Summary  [name] [id number] DOB: [birth year] DOA: 10/30/2016  PCP: Reginia Naas, MD  Admit date: 10/30/2016 Discharge date: 11/06/2016  Recommendations for Outpatient Follow-up:  Augmentin for 7 days on discharge Follow with surgery in regards to drain removal  Discharge Diagnoses:  Principal Problem:   Cholecystitis Active Problems:   Hypothyroidism   Anxiety state   COPD GOLD 0    Lung nodule   GERD   Abdominal pain   Nausea and vomiting   Acute kidney injury ([place])   Hiatal hernia    Discharge Condition: stable   Diet recommendation: as tolerated   History of present illness:  70 y.o.malewith past medical history significant for hypertension, hypothyroidism, GERD, depression, tobacco use, 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 CT of the abdomen was concerning for cholecystitis.  Information is obtained from the patient and his wife and daughter who are at the bedside. He reports 3 day history of abdominal pain. States the pain located in the epigastric area initially and then spread to the right upper and lower abdomen. Describes asconstant aching-like pain. Worse on the right upper and lower abdomen. Associated symptoms include nausea and vomiting. Reports 10 episodes emesis over last 2 days. One episode coffee ground emesis this am. Reports trying to drink water over the last 2 days but this may be abdominal pain worse and he would vomit shortly thereafter. He denies fever chills headache dizziness syncope or near-syncope. He denies dysuria hematuria frequency or urgency. He denies diarrhea constipation melena bright red blood per rectum. He denies chest pain palpitation shortness of breath. Underwent open cholecystectomy and pain was hard to control post operatively.  Hospital Course:   Assessment & Plan:  Acute  cholecystitis - CT scan on admission showed marked distention of the gallbladder with gallbladder wall edema and pericholecystic fluid, findings most consistent with acute cholecystitis. No definite gallstones were seen - Abd US showed acute cholecystitis  - S/p open cholecystectomy 10/31/2016 - Blood cx so far negative  - Also has RUQ drain and he will continue this on discharge and follow in office with Dr. Donne Hazel  - Discontinue zosyn and use Augmentin for 7 days on discharge  - Diet regular, tolerated well - Continue thorazine for hiccups  Acute kidney injury - Likely due to cholecystitis - Cr now WNL - Cr improved with fluids   Essential hypertension  - Controlled with Norvasc and metoprolol   Hypothyroidism - Continue synthroid    COPD - Appears stable and at baseline  Lung nodule - Stable - CT evaluated by pulmonary 9.2017. 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   Anemia of chronic disease - Due to pt history of malignancy - Hgb stable at 9.3   DVT prophylaxis:SCD's bilaterally  Code Status:full Family Communication:family not at the bedside this am    Consultants:  General Surgery  PT/ OT  Procedures:  Laparoscopic lysis of adhesions, laparoscopic converted to open subtotal cholecystectomy, wedge biopsy of liver lesion  Antimicrobials:   Zosyn     Signed:  Leisa Lenz, MD  [facility] 11/06/2016, 1:29 PM  Pager #: [phone]  Time spent in minutes: less than 30 minutes    Discharge Exam: Vitals:   11/06/16 0610 11/06/16 1203  BP: 123/67 140/83  Pulse: 72 81  Resp: 15 16  Temp: 97.6 F (36.4 C)  98.8 F (37.1 C)   Vitals:   11/05/16 2016 11/05/16 2131 11/06/16 0610 11/06/16 1203  BP:  117/76 123/67 140/83  Pulse:  78 72 81  Resp:  15 15 16   Temp:  98.5 F (36.9 C) 97.6 F (36.4 C) 98.8 F (37.1 C)  TempSrc:  Oral Oral Oral  SpO2: 97% 97% 97% 96%   Weight:   90.9 kg (200 lb 6.4 oz)   Height:        General: Pt is alert, follows commands appropriately, not in acute distress Cardiovascular: Regular rate and rhythm, S1/S2 +, no murmurs Respiratory: Clear to auscultation bilaterally, no wheezing, no crackles, no rhonchi Abdominal: Soft, non tender, non distended, bowel sounds +, no guarding; drain in place Extremities: no edema, no cyanosis, pulses palpable bilaterally DP and PT Neuro: Grossly nonfocal  Discharge Instructions  Discharge Instructions    Call MD for:  persistant nausea and vomiting    Complete by:  As directed    Call MD for:  redness, tenderness, or signs of infection (pain, swelling, redness, odor or green/yellow discharge around incision site)    Complete by:  As directed    Call MD for:  severe uncontrolled pain    Complete by:  As directed    Diet - low sodium heart healthy    Complete by:  As directed    Discharge instructions    Complete by:  As directed    Augmentin for 7 days on discharge Follow with surgery in regards to drain removal   Increase activity slowly    Complete by:  As directed        Medication List    STOP taking these medications   oxyCODONE-acetaminophen 7.5-325 MG tablet Commonly known as:  PERCOCET     TAKE these medications   albuterol 108 (90 Base) MCG/ACT inhaler Commonly known as:  VENTOLIN HFA Inhale 2 puffs into the lungs every 6 (six) hours as needed.   amLODipine 10 MG tablet Commonly known as:  NORVASC Take 10 mg by mouth daily.   amoxicillin-clavulanate 875-125 MG tablet Commonly known as:  AUGMENTIN Take 1 tablet by mouth 2 (two) times daily.   chlorproMAZINE 25 MG tablet Commonly known as:  THORAZINE Take 1 tablet (25 mg total) by mouth 3 (three) times daily as needed for hiccoughs.   DULoxetine 30 MG capsule Commonly known as:  CYMBALTA Take 3 capsules by mouth daily.   levothyroxine 150 MCG tablet Commonly known as:  SYNTHROID, LEVOTHROID Take  150 mcg by mouth daily.   mometasone-formoterol 100-5 MCG/ACT Aero Commonly known as:  DULERA Take 2 puffs first thing in am and then another 2 puffs about 12 hours later.   nicotine polacrilex 4 MG gum Commonly known as:  NICORETTE Take 4 mg by mouth as needed for smoking cessation.   ondansetron 8 MG tablet Commonly known as:  ZOFRAN Take 8 mg by mouth every 8 (eight) hours as needed for nausea.   oxyCODONE 15 MG immediate release tablet Commonly known as:  ROXICODONE Take 0.5 tablets (7.5 mg total) by mouth 4 (four) times daily.   ranitidine 150 MG capsule Commonly known as:  ZANTAC One at bedtime What changed:  how much to take  how to take this  when to take this  additional instructions   testosterone cypionate 200 MG/ML injection Commonly known as:  DEPOTESTOSTERONE CYPIONATE Inject 0.5 mLs into the muscle once a week.   ZOMETA IV Inject into the vein every 3 (  three) months.            Durable Medical Equipment        Start     Ordered   11/06/16 1324  For home use only DME 3 n 1  Once     11/06/16 1324   11/06/16 1324  For home use only DME Walker rolling  Once    Comments:  generalized weakness s/p surgery  Question:  Patient needs a walker to treat with the following condition  Answer:  Weakness generalized   11/06/16 1324     Follow-up Information    WAKEFIELD,MATTHEW, MD. Schedule an appointment as soon as possible for a visit in 1 week(s).   Specialty:  General Surgery Contact information: [place] [address] [phone]            The results of significant diagnostics from this hospitalization (including imaging, microbiology, ancillary and laboratory) are listed below for reference.    Significant Diagnostic Studies: Ct Abdomen Pelvis W Contrast  Result Date: 10/30/2016 CLINICAL DATA:  Abdominal pain, vomiting for several days, history of Crohn's disease with prior partial colonic resection EXAM:  CT ABDOMEN AND PELVIS WITH CONTRAST TECHNIQUE: Multidetector CT imaging of the abdomen and pelvis was performed using the standard protocol following bolus administration of intravenous contrast. CONTRAST:  1 ISOVUE-300 IOPAMIDOL (ISOVUE-300) INJECTION 61% COMPARISON:  CT abdomen pelvis of 01/15/2013 FINDINGS: Lower chest: There is hyper aeration at the lung bases. A calcified granuloma is noted anteriorly in the right middle lobe. However, there is a noncalcified soft tissue nodule within the left lower lobe on image number 4 measuring 12 mm. Previously this lesion measured 11 mm, and this lack of interval change suggests a benign process. Hepatobiliary: There does appear to be a small hiatal hernia present. The liver enhances with no focal abnormality. However, there is abnormality of the gallbladder which appears distended with gallbladder wall edema and probable pericholecystic fluid. These findings are highly suspicious for acute cholecystitis. No calcified gallstones are seen. Pancreas: The pancreas is normal in size and the pancreatic duct is not dilated. Spleen: The spleen is unremarkable other than multiple calcified splenic granulomas consistent with prior granulomatous disease Adrenals/Urinary Tract: The adrenal glands are unremarkable. The kidneys enhance with no calculus or mass and there is no evidence of hydronephrosis. A cyst as emanate from the upper pole of the left kidney which is unchanged. On delayed images, the pelvocaliceal systems are unremarkable. Ureters are normal in caliber. The urinary bladder is moderately distended and is indented by the significantly enlarged prostate. Stomach/Bowel: The stomach is decompressed and cannot be evaluated. There is some small bowel distension most likely due to mild ileus. No definite obstruction is seen. There is feces throughout the colon. Vascular/Lymphatic: The abdominal aorta is normal in caliber with moderate abdominal aortic atherosclerosis  present. No adenopathy is seen. Reproductive: The prostate is significantly enlarged measuring 5.9 x 6.7 cm appearing somewhat inhomogeneous. The prostate does indent the posterior inferior urinary bladder in the midline. Other: None Musculoskeletal: The lumbar vertebrae are in normal alignment. There is degenerative disc disease at L5-S1. The SI joints appear fused in this patient with inflammatory bowel disease. IMPRESSION: 1. Marked distention of the gallbladder with gallbladder wall edema and pericholecystic fluid, findings most consistent with acute cholecystitis. There are surrounding inflammatory changes as well. No definite gallstones are seen. 2. Stable noncalcified nodule in the left lower lobe compared to the CT from 2014 suggesting a  benign process. 3. Small hiatal hernia. 4. Multiple calcified splenic granulomas from prior granulomatous disease. 5. Significant Li enlarged prostate indenting the urinary bladder. 6. Moderate abdominal aortic atherosclerosis. Critical Value/emergent results were called by telephone at the time of interpretation on 10/30/2016 at 1:35 pm to Dr. Gareth Morgan , who verbally acknowledged these results. Electronically Signed   By: Ivar Drape M.D.   On: 10/30/2016 13:35   Dg Chest Port 1 View  Result Date: 10/30/2016 CLINICAL DATA:  Preoperative examination prior to abdominal surgery. EXAM: PORTABLE CHEST 1 VIEW COMPARISON:  10/16/2015 and prior exams FINDINGS: The cardiomediastinal silhouette is unremarkable. There is no evidence of focal airspace disease, pulmonary edema, suspicious pulmonary nodule/mass, pleural effusion, or pneumothorax. No acute bony abnormalities are identified. IMPRESSION: No active disease. Electronically Signed   By: Margarette Canada M.D.   On: 10/30/2016 16:45   US Abdomen Limited Ruq  Result Date: 10/30/2016 CLINICAL DATA:  Right upper quadrant pain over the last 6 weeks. EXAM: US ABDOMEN LIMITED - RIGHT UPPER QUADRANT COMPARISON:  CT  10/30/2016 FINDINGS: Gallbladder: Distended gallbladder with mild wall thickening and prominent pericholecystic edema. Partially shadowing filling defect within the gallbladder measuring 4 x 4 x 10 cm most consistent with tumefactive sludge. Common bile duct: Diameter: 5.7 mm, upper limits normal. Liver: Normal appearance of the liver. Trace free fluid adjacent to the right liver age. IMPRESSION: Findings most consistent with acute cholecystitis. The gallbladder is distended and shows pericholecystic fluid. Large tumefactive sludge ball within the gallbladder measuring up to 10 cm in diameter. Electronically Signed   By: Nelson Chimes M.D.   On: 10/30/2016 15:22    Microbiology: Recent Results (from the past 240 hour(s))  Blood culture (routine x 2)     Status: None   Collection Time: 10/30/16 12:49 PM  Result Value Ref Range Status   Specimen Description BLOOD RIGHT ANTECUBITAL  Final   Special Requests BOTTLES DRAWN AEROBIC AND ANAEROBIC 5 CC  Final   Culture NO GROWTH 5 DAYS  Final   Report Status 11/04/2016 FINAL  Final  Blood culture (routine x 2)     Status: None   Collection Time: 10/30/16 12:49 PM  Result Value Ref Range Status   Specimen Description BLOOD RIGHT HAND  Final   Special Requests BOTTLES DRAWN AEROBIC AND ANAEROBIC 5 CC  Final   Culture NO GROWTH 5 DAYS  Final   Report Status 11/04/2016 FINAL  Final  Surgical PCR screen     Status: Abnormal   Collection Time: 10/30/16 11:47 PM  Result Value Ref Range Status   MRSA, PCR NEGATIVE NEGATIVE Final   Staphylococcus aureus POSITIVE (A) NEGATIVE Final    Comment:        The Xpert SA Assay (FDA approved for NASAL specimens in patients over 73 years of age), is one component of a comprehensive surveillance program.  Test performance has been validated by [facility] for patients greater than or equal to 56 year old. It is not intended to diagnose infection nor to guide or monitor treatment.      Labs: Basic Metabolic  Panel:  Recent Labs Lab 10/31/16 0512 11/01/16 0612 11/02/16 0349 11/03/16 1022 11/06/16 0433  NA 133* 136 134* 134* 134*  K 3.7 3.9 3.8 3.7 4.6  CL 97* 101 99* 99* 96*  CO2 26 26 28 28 30   GLUCOSE 113* 120* 106* 117* 95  BUN 31* 21* 18 14 11   CREATININE 1.07 0.81 0.75 0.69 0.73  CALCIUM  8.3* 8.2* 8.2* 8.2* 8.3*   Liver Function Tests:  Recent Labs Lab 11/02/16 0349  AST 60*  ALT 52  ALKPHOS 20*  BILITOT 0.4  PROT 5.9*  ALBUMIN 2.4*   No results for input(s): LIPASE, AMYLASE in the last 168 hours. No results for input(s): AMMONIA in the last 168 hours. CBC:  Recent Labs Lab 10/31/16 0512 11/02/16 0349 11/04/16 0354 11/05/16 1001 11/06/16 0433  WBC 22.7* 10.7* 9.1 9.5 8.2  NEUTROABS  --  8.2*  --   --   --   HGB 12.7* 10.5* 9.4* 9.8* 9.3*  HCT 38.5* 31.6* 29.0* 29.8* 28.9*  MCV 91.2 90.5 90.1 90.9 90.9  PLT 234 298 303 398 453*   Cardiac Enzymes: No results for input(s): CKTOTAL, CKMB, CKMBINDEX, TROPONINI in the last 168 hours. BNP: BNP (last 3 results) No results for input(s): BNP in the last 8760 hours.  ProBNP (last 3 results) No results for input(s): PROBNP in the last 8760 hours.  CBG: No results for input(s): GLUCAP in the last 168 hours.

## 2016-11-06 NOTE — Progress Notes (Signed)
Pt ready for discharge home. IV removed. Pt. Is alert and oriented. Pt is hemodynamically stable. AVS reviewed with pt. Capable of re verbalizing medication regimen. Discharge plan appropriate and in place. Wife at bedside

## 2016-11-06 NOTE — Care Management Note (Signed)
Case Management Note  Patient Details  Name: [name] MRN: [id number] Date of Birth: 1946/11/17  Subjective/Objective:   70 yr old gentleman s/p open cholecystectomy.                 Action/Plan: Case manager spoke with patient and wife concerning [place] and DME needs at discharge. Choice for [place] was offered, referral was called to Stevie Kern, [place] Liaison. Case manager ordered rolling walker and 3in1. He will have family support at discharge. Patient will go home with JP drain.   Expected Discharge Date:    11/06/16              Expected Discharge Plan:   Home with Home Health  In-House Referral:     Discharge planning Services  CM Consult  Post Acute Care Choice:  Durable Medical Equipment, Home Health Choice offered to:  Patient, Spouse  DME Arranged:  3-N-1, Walker rolling DME Agency:  [place]:  PT, RN, Nurse's Aide [place] Agency:  [place]  Status of Service:  Completed, signed off  If discussed at [place] of Stay Meetings, dates discussed:    Additional Comments:  Ninfa Meeker, RN 11/06/2016, 1:58 PM

## 2016-11-06 NOTE — Progress Notes (Signed)
6 Days Post-Op  Subjective: Vomited a small amount yesterday afternoon. Feels better now. Passing gas. Ate some breakfast.  Objective: Vital signs in last 24 hours: Temp:  [97.6 F (36.4 C)-98.5 F (36.9 C)] 97.6 F (36.4 C) (12/07 0610) Pulse Rate:  [72-84] 72 (12/07 0610) Resp:  [15-17] 15 (12/07 0610) BP: (116-123)/(67-76) 123/67 (12/07 0610) SpO2:  [95 %-97 %] 97 % (12/07 0610) Weight:  [90.9 kg (200 lb 6.4 oz)] 90.9 kg (200 lb 6.4 oz) (12/07 0610) Last BM Date:  (PTA)  Intake/Output from previous day: 12/06 0701 - 12/07 0700 In: 310 [P.O.:240; IV Piggyback:50] Out: 770 [Urine:750; Drains:20] Intake/Output this shift: No intake/output data recorded.  GI: soft, incision CDI, +BS, JP SS  Lab Results:   Recent Labs  11/05/16 1001 11/06/16 0433  WBC 9.5 8.2  HGB 9.8* 9.3*  HCT 29.8* 28.9*  PLT 398 453*   BMET  Recent Labs  11/03/16 1022 11/06/16 0433  NA 134* 134*  K 3.7 4.6  CL 99* 96*  CO2 28 30  GLUCOSE 117* 95  BUN 14 11  CREATININE 0.69 0.73  CALCIUM 8.2* 8.3*   PT/INR No results for input(s): LABPROT, INR in the last 72 hours. ABG No results for input(s): PHART, HCO3 in the last 72 hours.  Invalid input(s): PCO2, PO2  Studies/Results: No results found.  Anti-infectives: Anti-infectives    Start     Dose/Rate Route Frequency Ordered Stop   10/30/16 1800  piperacillin-tazobactam (ZOSYN) IVPB 3.375 g     3.375 g 12.5 mL/hr over 240 Minutes Intravenous Every 8 hours 10/30/16 1514     10/30/16 1145  piperacillin-tazobactam (ZOSYN) IVPB 3.375 g     3.375 g 100 mL/hr over 30 Minutes Intravenous  Once 10/30/16 1138 10/30/16 1359      Assessment/Plan: Laparoscopic lysis of adhesions, laparoscopic converted to open subtotal cholecystectomy, wedge biopsy of liver lesion 12/1 Dr. Donne Hazel - POD 6 - drain 20cc/24hr serosanguinous  COPD HTN Carcinoma of right vocal cord. S/p radiation therapy  ID - zosyn 11/30>>, afeb and WBC WNL FEN -  soft, PO pain meds VTE - SCDs  Plan - stop Zosyn, can likely D/C today if continues to feel well.  LOS: 7 days    Charitie Hinote E 11/06/2016

## 2016-11-06 NOTE — Evaluation (Signed)
Physical Therapy Evaluation Patient Details Name: [name] MRN: [id number] DOB: 07-23-1946 Today's Date: 11/06/2016   History of Present Illness  Pt is a 70 y.o. male s/p laparoscopic lysis of adherions, laparoscopic converted to open subtotal cholecystectomy, edge biopsy of liver lesion on 10/31/16 after presenting to the ED with abdominal pain, nausea and vomiting due to acute cholecystitis. Pt has a PMH significant for: hypothyroid, depression, vocal cord cancer, and hypertension.    Clinical Impression  Prior to admission, pt was completely independent including driving himself to MD appointments. Currently pt is very deconditioned and is reliant on a RW for gait due to increased fatigue. Pt lives with is wife in a single level home and his wife will be available upon discharge to assist him. Pt will benefit from continued HHPT services upon discharge in order to improve strength and reduce his risk for additional functional decline. Pt will benefit from being followed acutely throughout his stay in order to improve strength and reduce further decline.     Follow Up Recommendations Home health PT    Equipment Recommendations  Rolling walker with 5" wheels;3in1 (PT)    Recommendations for Other Services       Precautions / Restrictions Precautions Precautions: Fall Restrictions Weight Bearing Restrictions: No      Mobility  Bed Mobility Overal bed mobility: Needs Assistance Bed Mobility: Supine to Sit     Supine to sit: Min guard Sit to supine: Min guard      Transfers Overall transfer level: Needs assistance Equipment used: Standard walker Transfers: Sit to/from Stand Sit to Stand: Min assist;+2 safety/equipment            Ambulation/Gait Ambulation/Gait assistance: Min guard Ambulation Distance (Feet): 150 Feet Assistive device: Rolling walker (2 wheeled) Gait Pattern/deviations: Step-through pattern Gait velocity: decreased Gait velocity  interpretation: Below normal speed for age/gender General Gait Details: Slow cadence, kyphotic posture  Stairs            Wheelchair Mobility    Modified Rankin (Stroke Patients Only)       Balance Overall balance assessment: Needs assistance Sitting-balance support: No upper extremity supported;Feet supported Sitting balance-Leahy Scale: Good     Standing balance support: Bilateral upper extremity supported;During functional activity Standing balance-Leahy Scale: Poor Standing balance comment: Reliant on BUE support on RW.                             Pertinent Vitals/Pain Pain Assessment: Faces Faces Pain Scale: Hurts even more Pain Location: abdomen Pain Descriptors / Indicators: Sore Pain Intervention(s): Limited activity within patient's tolerance;Monitored during session;Repositioned    Home Living Family/patient expects to be discharged to:: Private residence Living Arrangements: Spouse/significant other Available Help at Discharge: Family;Available 24 hours/day Type of Home: House Home Access: Stairs to enter Entrance Stairs-Rails: None Entrance Stairs-Number of Steps: 1 Home Layout: One level Home Equipment: None      Prior Function Level of Independence: Independent         Comments: was completely indendent and driving     Hand Dominance   Dominant Hand: Right    Extremity/Trunk Assessment   Upper Extremity Assessment: Defer to OT evaluation           Lower Extremity Assessment: Generalized weakness      Cervical / Trunk Assessment: Kyphotic  Communication   Communication: No difficulties  Cognition Arousal/Alertness: Awake/alert Behavior During Therapy: WFL for tasks assessed/performed Overall Cognitive Status: Within  Functional Limits for tasks assessed                      General Comments      Exercises     Assessment/Plan    PT Assessment Patient needs continued PT services  PT Problem List  Decreased strength;Decreased activity tolerance;Decreased balance;Decreased mobility;Decreased knowledge of use of DME          PT Treatment Interventions DME instruction;Gait training;Functional mobility training;Therapeutic activities;Therapeutic exercise;Balance training;Patient/family education    PT Goals (Current goals can be found in the Care Plan section)  Acute Rehab PT Goals Patient Stated Goal: to go home and feel better PT Goal Formulation: With patient Time For Goal Achievement: 11/13/16 Potential to Achieve Goals: Good    Frequency Min 3X/week   Barriers to discharge        Co-evaluation PT/OT/SLP Co-Evaluation/Treatment: Yes Reason for Co-Treatment: For patient/therapist safety PT goals addressed during session: Mobility/safety with mobility OT goals addressed during session: ADL's and self-care       End of Session Equipment Utilized During Treatment: Gait belt Activity Tolerance: Patient tolerated treatment well;Patient limited by fatigue Patient left: in chair;with call bell/phone within reach Nurse Communication: Mobility status         Time: [id number] PT Time Calculation (min) (ACUTE ONLY): 20 min   Charges:   PT Evaluation $PT Eval Low Complexity: 1 Procedure     PT G Codes:        Scheryl Marten PT, DPT  [phone]  11/06/2016, 12:30 PM

## 2016-11-07 DIAGNOSIS — J449 Chronic obstructive pulmonary disease, unspecified: Secondary | ICD-10-CM | POA: Diagnosis not present

## 2016-11-07 DIAGNOSIS — M810 Age-related osteoporosis without current pathological fracture: Secondary | ICD-10-CM | POA: Diagnosis not present

## 2016-11-07 DIAGNOSIS — Z8521 Personal history of malignant neoplasm of larynx: Secondary | ICD-10-CM | POA: Diagnosis not present

## 2016-11-07 DIAGNOSIS — R918 Other nonspecific abnormal finding of lung field: Secondary | ICD-10-CM | POA: Diagnosis not present

## 2016-11-07 DIAGNOSIS — M150 Primary generalized (osteo)arthritis: Secondary | ICD-10-CM | POA: Diagnosis not present

## 2016-11-07 DIAGNOSIS — K219 Gastro-esophageal reflux disease without esophagitis: Secondary | ICD-10-CM | POA: Diagnosis not present

## 2016-11-07 DIAGNOSIS — I10 Essential (primary) hypertension: Secondary | ICD-10-CM | POA: Diagnosis not present

## 2016-11-07 DIAGNOSIS — F329 Major depressive disorder, single episode, unspecified: Secondary | ICD-10-CM | POA: Diagnosis not present

## 2016-11-07 DIAGNOSIS — Z48815 Encounter for surgical aftercare following surgery on the digestive system: Secondary | ICD-10-CM | POA: Diagnosis not present

## 2016-11-07 DIAGNOSIS — Z434 Encounter for attention to other artificial openings of digestive tract: Secondary | ICD-10-CM | POA: Diagnosis not present

## 2016-11-07 DIAGNOSIS — D638 Anemia in other chronic diseases classified elsewhere: Secondary | ICD-10-CM | POA: Diagnosis not present

## 2016-11-07 DIAGNOSIS — Z87891 Personal history of nicotine dependence: Secondary | ICD-10-CM | POA: Diagnosis not present

## 2016-11-10 DIAGNOSIS — R918 Other nonspecific abnormal finding of lung field: Secondary | ICD-10-CM | POA: Diagnosis not present

## 2016-11-10 DIAGNOSIS — J449 Chronic obstructive pulmonary disease, unspecified: Secondary | ICD-10-CM | POA: Diagnosis not present

## 2016-11-10 DIAGNOSIS — M150 Primary generalized (osteo)arthritis: Secondary | ICD-10-CM | POA: Diagnosis not present

## 2016-11-10 DIAGNOSIS — D638 Anemia in other chronic diseases classified elsewhere: Secondary | ICD-10-CM | POA: Diagnosis not present

## 2016-11-10 DIAGNOSIS — Z48815 Encounter for surgical aftercare following surgery on the digestive system: Secondary | ICD-10-CM | POA: Diagnosis not present

## 2016-11-10 DIAGNOSIS — Z434 Encounter for attention to other artificial openings of digestive tract: Secondary | ICD-10-CM | POA: Diagnosis not present

## 2016-11-11 DIAGNOSIS — D638 Anemia in other chronic diseases classified elsewhere: Secondary | ICD-10-CM | POA: Diagnosis not present

## 2016-11-11 DIAGNOSIS — M150 Primary generalized (osteo)arthritis: Secondary | ICD-10-CM | POA: Diagnosis not present

## 2016-11-11 DIAGNOSIS — R918 Other nonspecific abnormal finding of lung field: Secondary | ICD-10-CM | POA: Diagnosis not present

## 2016-11-11 DIAGNOSIS — Z48815 Encounter for surgical aftercare following surgery on the digestive system: Secondary | ICD-10-CM | POA: Diagnosis not present

## 2016-11-11 DIAGNOSIS — Z434 Encounter for attention to other artificial openings of digestive tract: Secondary | ICD-10-CM | POA: Diagnosis not present

## 2016-11-11 DIAGNOSIS — J449 Chronic obstructive pulmonary disease, unspecified: Secondary | ICD-10-CM | POA: Diagnosis not present

## 2016-11-12 DIAGNOSIS — M150 Primary generalized (osteo)arthritis: Secondary | ICD-10-CM | POA: Diagnosis not present

## 2016-11-12 DIAGNOSIS — D638 Anemia in other chronic diseases classified elsewhere: Secondary | ICD-10-CM | POA: Diagnosis not present

## 2016-11-12 DIAGNOSIS — R918 Other nonspecific abnormal finding of lung field: Secondary | ICD-10-CM | POA: Diagnosis not present

## 2016-11-12 DIAGNOSIS — Z434 Encounter for attention to other artificial openings of digestive tract: Secondary | ICD-10-CM | POA: Diagnosis not present

## 2016-11-12 DIAGNOSIS — J449 Chronic obstructive pulmonary disease, unspecified: Secondary | ICD-10-CM | POA: Diagnosis not present

## 2016-11-12 DIAGNOSIS — Z48815 Encounter for surgical aftercare following surgery on the digestive system: Secondary | ICD-10-CM | POA: Diagnosis not present

## 2016-11-13 DIAGNOSIS — Z48815 Encounter for surgical aftercare following surgery on the digestive system: Secondary | ICD-10-CM | POA: Diagnosis not present

## 2016-11-13 DIAGNOSIS — D638 Anemia in other chronic diseases classified elsewhere: Secondary | ICD-10-CM | POA: Diagnosis not present

## 2016-11-13 DIAGNOSIS — R918 Other nonspecific abnormal finding of lung field: Secondary | ICD-10-CM | POA: Diagnosis not present

## 2016-11-13 DIAGNOSIS — J449 Chronic obstructive pulmonary disease, unspecified: Secondary | ICD-10-CM | POA: Diagnosis not present

## 2016-11-13 DIAGNOSIS — Z434 Encounter for attention to other artificial openings of digestive tract: Secondary | ICD-10-CM | POA: Diagnosis not present

## 2016-11-13 DIAGNOSIS — M150 Primary generalized (osteo)arthritis: Secondary | ICD-10-CM | POA: Diagnosis not present

## 2016-11-14 DIAGNOSIS — R918 Other nonspecific abnormal finding of lung field: Secondary | ICD-10-CM | POA: Diagnosis not present

## 2016-11-14 DIAGNOSIS — J449 Chronic obstructive pulmonary disease, unspecified: Secondary | ICD-10-CM | POA: Diagnosis not present

## 2016-11-14 DIAGNOSIS — M150 Primary generalized (osteo)arthritis: Secondary | ICD-10-CM | POA: Diagnosis not present

## 2016-11-14 DIAGNOSIS — D638 Anemia in other chronic diseases classified elsewhere: Secondary | ICD-10-CM | POA: Diagnosis not present

## 2016-11-14 DIAGNOSIS — Z48815 Encounter for surgical aftercare following surgery on the digestive system: Secondary | ICD-10-CM | POA: Diagnosis not present

## 2016-11-14 DIAGNOSIS — Z434 Encounter for attention to other artificial openings of digestive tract: Secondary | ICD-10-CM | POA: Diagnosis not present

## 2016-11-17 DIAGNOSIS — R918 Other nonspecific abnormal finding of lung field: Secondary | ICD-10-CM | POA: Diagnosis not present

## 2016-11-17 DIAGNOSIS — R1013 Epigastric pain: Secondary | ICD-10-CM | POA: Diagnosis not present

## 2016-11-17 DIAGNOSIS — Z434 Encounter for attention to other artificial openings of digestive tract: Secondary | ICD-10-CM | POA: Diagnosis not present

## 2016-11-17 DIAGNOSIS — J449 Chronic obstructive pulmonary disease, unspecified: Secondary | ICD-10-CM | POA: Diagnosis not present

## 2016-11-17 DIAGNOSIS — Z79891 Long term (current) use of opiate analgesic: Secondary | ICD-10-CM | POA: Diagnosis not present

## 2016-11-17 DIAGNOSIS — Z48815 Encounter for surgical aftercare following surgery on the digestive system: Secondary | ICD-10-CM | POA: Diagnosis not present

## 2016-11-17 DIAGNOSIS — M150 Primary generalized (osteo)arthritis: Secondary | ICD-10-CM | POA: Diagnosis not present

## 2016-11-17 DIAGNOSIS — G894 Chronic pain syndrome: Secondary | ICD-10-CM | POA: Diagnosis not present

## 2016-11-17 DIAGNOSIS — D638 Anemia in other chronic diseases classified elsewhere: Secondary | ICD-10-CM | POA: Diagnosis not present

## 2016-11-18 DIAGNOSIS — J449 Chronic obstructive pulmonary disease, unspecified: Secondary | ICD-10-CM | POA: Diagnosis not present

## 2016-11-18 DIAGNOSIS — R918 Other nonspecific abnormal finding of lung field: Secondary | ICD-10-CM | POA: Diagnosis not present

## 2016-11-18 DIAGNOSIS — M150 Primary generalized (osteo)arthritis: Secondary | ICD-10-CM | POA: Diagnosis not present

## 2016-11-18 DIAGNOSIS — Z48815 Encounter for surgical aftercare following surgery on the digestive system: Secondary | ICD-10-CM | POA: Diagnosis not present

## 2016-11-18 DIAGNOSIS — Z434 Encounter for attention to other artificial openings of digestive tract: Secondary | ICD-10-CM | POA: Diagnosis not present

## 2016-11-18 DIAGNOSIS — D638 Anemia in other chronic diseases classified elsewhere: Secondary | ICD-10-CM | POA: Diagnosis not present

## 2016-11-20 DIAGNOSIS — Z48815 Encounter for surgical aftercare following surgery on the digestive system: Secondary | ICD-10-CM | POA: Diagnosis not present

## 2016-11-20 DIAGNOSIS — D638 Anemia in other chronic diseases classified elsewhere: Secondary | ICD-10-CM | POA: Diagnosis not present

## 2016-11-20 DIAGNOSIS — M150 Primary generalized (osteo)arthritis: Secondary | ICD-10-CM | POA: Diagnosis not present

## 2016-11-20 DIAGNOSIS — J449 Chronic obstructive pulmonary disease, unspecified: Secondary | ICD-10-CM | POA: Diagnosis not present

## 2016-11-20 DIAGNOSIS — R918 Other nonspecific abnormal finding of lung field: Secondary | ICD-10-CM | POA: Diagnosis not present

## 2016-11-20 DIAGNOSIS — Z434 Encounter for attention to other artificial openings of digestive tract: Secondary | ICD-10-CM | POA: Diagnosis not present

## 2016-11-21 DIAGNOSIS — Z434 Encounter for attention to other artificial openings of digestive tract: Secondary | ICD-10-CM | POA: Diagnosis not present

## 2016-11-21 DIAGNOSIS — R918 Other nonspecific abnormal finding of lung field: Secondary | ICD-10-CM | POA: Diagnosis not present

## 2016-11-21 DIAGNOSIS — J449 Chronic obstructive pulmonary disease, unspecified: Secondary | ICD-10-CM | POA: Diagnosis not present

## 2016-11-21 DIAGNOSIS — M150 Primary generalized (osteo)arthritis: Secondary | ICD-10-CM | POA: Diagnosis not present

## 2016-11-21 DIAGNOSIS — Z48815 Encounter for surgical aftercare following surgery on the digestive system: Secondary | ICD-10-CM | POA: Diagnosis not present

## 2016-11-21 DIAGNOSIS — D638 Anemia in other chronic diseases classified elsewhere: Secondary | ICD-10-CM | POA: Diagnosis not present

## 2016-11-27 DIAGNOSIS — Z48815 Encounter for surgical aftercare following surgery on the digestive system: Secondary | ICD-10-CM | POA: Diagnosis not present

## 2016-11-27 DIAGNOSIS — M150 Primary generalized (osteo)arthritis: Secondary | ICD-10-CM | POA: Diagnosis not present

## 2016-11-27 DIAGNOSIS — J449 Chronic obstructive pulmonary disease, unspecified: Secondary | ICD-10-CM | POA: Diagnosis not present

## 2016-11-27 DIAGNOSIS — R918 Other nonspecific abnormal finding of lung field: Secondary | ICD-10-CM | POA: Diagnosis not present

## 2016-11-27 DIAGNOSIS — D638 Anemia in other chronic diseases classified elsewhere: Secondary | ICD-10-CM | POA: Diagnosis not present

## 2016-11-27 DIAGNOSIS — Z434 Encounter for attention to other artificial openings of digestive tract: Secondary | ICD-10-CM | POA: Diagnosis not present

## 2016-12-02 ENCOUNTER — Encounter: Payer: Self-pay | Admitting: Internal Medicine

## 2016-12-02 ENCOUNTER — Ambulatory Visit (INDEPENDENT_AMBULATORY_CARE_PROVIDER_SITE_OTHER): Payer: Medicare Other | Admitting: Internal Medicine

## 2016-12-02 DIAGNOSIS — K219 Gastro-esophageal reflux disease without esophagitis: Secondary | ICD-10-CM | POA: Diagnosis not present

## 2016-12-02 DIAGNOSIS — J440 Chronic obstructive pulmonary disease with acute lower respiratory infection: Secondary | ICD-10-CM

## 2016-12-02 MED ORDER — AZITHROMYCIN 250 MG PO TABS: ORAL_TABLET | ORAL | 0 refills | Status: DC

## 2016-12-02 NOTE — Patient Instructions (Signed)
Script sent for Zpak  Stay warm, rested and well-hydrated  While out of town you could go to an urgent care if needed. Otherwise call us if you need our help.  Make your follow-up appointment as discussed

## 2016-12-02 NOTE — Progress Notes (Signed)
12/02/2016 -71 year old former smoker male followed at this office for mild COPD, lung nodules consistent with Histo. 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 Low-grade fever. Myalgias. Had flu shot. Planning a trip out of town and wanted to be checked before he left. CXR 10/30/2016-single view-NAD Open cholecystectomy for cholecystitis 10/2016  ROS-see HPI   Negative unless "+" Constitutional:    weight loss, night sweats, fevers, chills, fatigue, lassitude. HEENT:    headaches, difficulty swallowing, tooth/dental problems, sore throat,       sneezing, itching, ear ache, nasal congestion, post nasal drip, snoring CV:    chest pain, orthopnea, PND, swelling in lower extremities, anasarca,                                                    dizziness, palpitations Resp:   shortness of breath with exertion or at rest.                +productive cough,   non-productive cough, coughing up of blood.              +change in color of mucus.  wheezing.   Skin:    rash or lesions. GI:  No-   heartburn, indigestion, abdominal pain, nausea, vomiting, diarrhea,                 change in bowel habits, loss of appetite GU: dysuria, change in color of urine, no urgency or frequency.   flank pain. MS:   joint pain, stiffness, decreased range of motion, back pain. Neuro-     nothing unusual Psych:  change in mood or affect.  depression or anxiety.   memory loss.  OBJ- Physical Exam General- Alert, Oriented, Affect-appropriate, Distress- none acute, slender Skin- rash-none, lesions- none, excoriation- none Lymphadenopathy- none Head- atraumatic            Eyes- Gross vision intact, PERRLA, conjunctivae and secretions clear        Ears- Hearing, canals-normal            Nose- + sniffing, no-Septal dev, mucus, polyps, erosion, perforation             Throat- Mallampati II , mucosa clear , drainage- none, tonsils- atrophic Neck- flexible , trachea midline, no stridor , thyroid nl, carotid no bruit Chest - symmetrical excursion , unlabored           Heart/CV- RRR , no murmur , no gallop  , no rub, nl s1 s2                           - JVD- none , edema- none, stasis changes- none, varices- none           Lung- + bilateral rhonchi, wheeze- none, cough- none , dullness-none, rub- none           Chest wall-  Abd-  Br/ Gen/ Rectal- Not done, not indicated Extrem- cyanosis- none, clubbing, none, atrophy- none, strength- nl Neuro- grossly intact to observation

## 2016-12-03 ENCOUNTER — Inpatient Hospital Stay (HOSPITAL_COMMUNITY)
Admission: EM | Admit: 2016-12-03 | Discharge: 2016-12-10 | DRG: 871 | Disposition: A | Discharge status: Home Health | Payer: Medicare Other | Attending: Internal Medicine | Admitting: Internal Medicine

## 2016-12-03 ENCOUNTER — Encounter (HOSPITAL_COMMUNITY): Payer: Self-pay | Admitting: Nurse Practitioner

## 2016-12-03 ENCOUNTER — Emergency Department (HOSPITAL_COMMUNITY): Payer: Medicare Other

## 2016-12-03 DIAGNOSIS — A419 Sepsis, unspecified organism: Secondary | ICD-10-CM | POA: Diagnosis not present

## 2016-12-03 DIAGNOSIS — Z87891 Personal history of nicotine dependence: Secondary | ICD-10-CM

## 2016-12-03 DIAGNOSIS — I4581 Long QT syndrome: Secondary | ICD-10-CM | POA: Diagnosis present

## 2016-12-03 DIAGNOSIS — R1110 Vomiting, unspecified: Secondary | ICD-10-CM

## 2016-12-03 DIAGNOSIS — K449 Diaphragmatic hernia without obstruction or gangrene: Secondary | ICD-10-CM | POA: Diagnosis present

## 2016-12-03 DIAGNOSIS — K210 Gastro-esophageal reflux disease with esophagitis: Secondary | ICD-10-CM | POA: Diagnosis present

## 2016-12-03 DIAGNOSIS — Z79891 Long term (current) use of opiate analgesic: Secondary | ICD-10-CM

## 2016-12-03 DIAGNOSIS — J690 Pneumonitis due to inhalation of food and vomit: Secondary | ICD-10-CM | POA: Diagnosis present

## 2016-12-03 DIAGNOSIS — R110 Nausea: Secondary | ICD-10-CM | POA: Diagnosis not present

## 2016-12-03 DIAGNOSIS — Z8521 Personal history of malignant neoplasm of larynx: Secondary | ICD-10-CM

## 2016-12-03 DIAGNOSIS — F329 Major depressive disorder, single episode, unspecified: Secondary | ICD-10-CM | POA: Diagnosis present

## 2016-12-03 DIAGNOSIS — M810 Age-related osteoporosis without current pathological fracture: Secondary | ICD-10-CM | POA: Diagnosis present

## 2016-12-03 DIAGNOSIS — Y95 Nosocomial condition: Secondary | ICD-10-CM | POA: Diagnosis present

## 2016-12-03 DIAGNOSIS — Z79899 Other long term (current) drug therapy: Secondary | ICD-10-CM

## 2016-12-03 DIAGNOSIS — K228 Other specified diseases of esophagus: Secondary | ICD-10-CM | POA: Diagnosis present

## 2016-12-03 DIAGNOSIS — Z6823 Body mass index (BMI) 23.0-23.9, adult: Secondary | ICD-10-CM

## 2016-12-03 DIAGNOSIS — J449 Chronic obstructive pulmonary disease, unspecified: Secondary | ICD-10-CM | POA: Diagnosis present

## 2016-12-03 DIAGNOSIS — F17220 Nicotine dependence, chewing tobacco, uncomplicated: Secondary | ICD-10-CM | POA: Diagnosis present

## 2016-12-03 DIAGNOSIS — I10 Essential (primary) hypertension: Secondary | ICD-10-CM | POA: Diagnosis present

## 2016-12-03 DIAGNOSIS — G8929 Other chronic pain: Secondary | ICD-10-CM | POA: Diagnosis not present

## 2016-12-03 DIAGNOSIS — F419 Anxiety disorder, unspecified: Secondary | ICD-10-CM | POA: Diagnosis present

## 2016-12-03 DIAGNOSIS — Z885 Allergy status to narcotic agent status: Secondary | ICD-10-CM

## 2016-12-03 DIAGNOSIS — R0902 Hypoxemia: Secondary | ICD-10-CM | POA: Diagnosis not present

## 2016-12-03 DIAGNOSIS — A4189 Other specified sepsis: Principal | ICD-10-CM | POA: Diagnosis present

## 2016-12-03 DIAGNOSIS — R933 Abnormal findings on diagnostic imaging of other parts of digestive tract: Secondary | ICD-10-CM | POA: Diagnosis not present

## 2016-12-03 DIAGNOSIS — J189 Pneumonia, unspecified organism: Secondary | ICD-10-CM | POA: Diagnosis present

## 2016-12-03 DIAGNOSIS — R109 Unspecified abdominal pain: Secondary | ICD-10-CM

## 2016-12-03 DIAGNOSIS — R740 Nonspecific elevation of levels of transaminase and lactic acid dehydrogenase [LDH]: Secondary | ICD-10-CM | POA: Diagnosis present

## 2016-12-03 DIAGNOSIS — E860 Dehydration: Secondary | ICD-10-CM | POA: Diagnosis present

## 2016-12-03 DIAGNOSIS — M1990 Unspecified osteoarthritis, unspecified site: Secondary | ICD-10-CM | POA: Diagnosis present

## 2016-12-03 DIAGNOSIS — E46 Unspecified protein-calorie malnutrition: Secondary | ICD-10-CM | POA: Diagnosis present

## 2016-12-03 DIAGNOSIS — J181 Lobar pneumonia, unspecified organism: Secondary | ICD-10-CM | POA: Diagnosis not present

## 2016-12-03 DIAGNOSIS — K224 Dyskinesia of esophagus: Secondary | ICD-10-CM | POA: Diagnosis present

## 2016-12-03 DIAGNOSIS — R509 Fever, unspecified: Secondary | ICD-10-CM

## 2016-12-03 DIAGNOSIS — R0602 Shortness of breath: Secondary | ICD-10-CM | POA: Diagnosis not present

## 2016-12-03 DIAGNOSIS — Z881 Allergy status to other antibiotic agents status: Secondary | ICD-10-CM | POA: Diagnosis not present

## 2016-12-03 DIAGNOSIS — K219 Gastro-esophageal reflux disease without esophagitis: Secondary | ICD-10-CM | POA: Diagnosis not present

## 2016-12-03 DIAGNOSIS — E039 Hypothyroidism, unspecified: Secondary | ICD-10-CM | POA: Diagnosis present

## 2016-12-03 DIAGNOSIS — Z888 Allergy status to other drugs, medicaments and biological substances status: Secondary | ICD-10-CM

## 2016-12-03 DIAGNOSIS — E876 Hypokalemia: Secondary | ICD-10-CM | POA: Diagnosis present

## 2016-12-03 DIAGNOSIS — J9601 Acute respiratory failure with hypoxia: Secondary | ICD-10-CM | POA: Diagnosis present

## 2016-12-03 DIAGNOSIS — R1084 Generalized abdominal pain: Secondary | ICD-10-CM | POA: Diagnosis not present

## 2016-12-03 DIAGNOSIS — R112 Nausea with vomiting, unspecified: Secondary | ICD-10-CM | POA: Diagnosis not present

## 2016-12-03 DIAGNOSIS — Z9049 Acquired absence of other specified parts of digestive tract: Secondary | ICD-10-CM | POA: Diagnosis not present

## 2016-12-03 LAB — I-STAT ARTERIAL BLOOD GAS, ED
Acid-Base Excess: 1 mmol/L (ref 0.0–2.0)
Bicarbonate: 24.5 mmol/L (ref 20.0–28.0)
O2 Saturation: 93 %
PH ART: 7.453 — AB (ref 7.350–7.450)
TCO2: 25 mmol/L (ref 0–100)
pCO2 arterial: 35.2 mmHg (ref 32.0–48.0)
pO2, Arterial: 66 mmHg — ABNORMAL LOW (ref 83.0–108.0)

## 2016-12-03 LAB — CBC
HEMATOCRIT: 33.3 % — AB (ref 39.0–52.0)
Hemoglobin: 10.9 g/dL — ABNORMAL LOW (ref 13.0–17.0)
MCH: 29.1 pg (ref 26.0–34.0)
MCHC: 32.7 g/dL (ref 30.0–36.0)
MCV: 89 fL (ref 78.0–100.0)
PLATELETS: 269 10*3/uL (ref 150–400)
RBC: 3.74 MIL/uL — ABNORMAL LOW (ref 4.22–5.81)
RDW: 14.1 % (ref 11.5–15.5)
WBC: 7.5 10*3/uL (ref 4.0–10.5)

## 2016-12-03 LAB — I-STAT CHEM 8, ED
BUN: 26 mg/dL — ABNORMAL HIGH (ref 6–20)
CALCIUM ION: 1.11 mmol/L — AB (ref 1.15–1.40)
CHLORIDE: 96 mmol/L — AB (ref 101–111)
Creatinine, Ser: 1.3 mg/dL — ABNORMAL HIGH (ref 0.61–1.24)
Glucose, Bld: 103 mg/dL — ABNORMAL HIGH (ref 65–99)
HEMATOCRIT: 33 % — AB (ref 39.0–52.0)
Hemoglobin: 11.2 g/dL — ABNORMAL LOW (ref 13.0–17.0)
POTASSIUM: 4.5 mmol/L (ref 3.5–5.1)
SODIUM: 133 mmol/L — AB (ref 135–145)
TCO2: 31 mmol/L (ref 0–100)

## 2016-12-03 LAB — URINALYSIS, ROUTINE W REFLEX MICROSCOPIC
BACTERIA UA: NONE SEEN
Glucose, UA: NEGATIVE mg/dL
HGB URINE DIPSTICK: NEGATIVE
Ketones, ur: 5 mg/dL — AB
Leukocytes, UA: NEGATIVE
Nitrite: NEGATIVE
PROTEIN: 30 mg/dL — AB
SQUAMOUS EPITHELIAL / LPF: NONE SEEN
Specific Gravity, Urine: 1.027 (ref 1.005–1.030)
pH: 5 (ref 5.0–8.0)

## 2016-12-03 LAB — COMPREHENSIVE METABOLIC PANEL
ALBUMIN: 3.2 g/dL — AB (ref 3.5–5.0)
ALT: 38 U/L (ref 17–63)
AST: 44 U/L — AB (ref 15–41)
Alkaline Phosphatase: 28 U/L — ABNORMAL LOW (ref 38–126)
Anion gap: 9 (ref 5–15)
BILIRUBIN TOTAL: 1 mg/dL (ref 0.3–1.2)
BUN: 19 mg/dL (ref 6–20)
CHLORIDE: 99 mmol/L — AB (ref 101–111)
CO2: 27 mmol/L (ref 22–32)
CREATININE: 1.43 mg/dL — AB (ref 0.61–1.24)
Calcium: 9 mg/dL (ref 8.9–10.3)
GFR calc Af Amer: 55 mL/min — ABNORMAL LOW (ref >60)
GFR calc non Af Amer: 47 mL/min — ABNORMAL LOW (ref >60)
GLUCOSE: 105 mg/dL — AB (ref 65–99)
POTASSIUM: 3.9 mmol/L (ref 3.5–5.1)
Sodium: 135 mmol/L (ref 135–145)
Total Protein: 6.8 g/dL (ref 6.5–8.1)

## 2016-12-03 LAB — STREP PNEUMONIAE URINARY ANTIGEN: Strep Pneumo Urinary Antigen: NEGATIVE

## 2016-12-03 LAB — PROTIME-INR
INR: 1.24
PROTHROMBIN TIME: 15.6 s — AB (ref 11.4–15.2)

## 2016-12-03 LAB — PROCALCITONIN: Procalcitonin: 46.86 ng/mL

## 2016-12-03 LAB — APTT: aPTT: 37 seconds — ABNORMAL HIGH (ref 24–36)

## 2016-12-03 LAB — LACTIC ACID, PLASMA
Lactic Acid, Venous: 1.8 mmol/L (ref 0.5–1.9)
Lactic Acid, Venous: 2.2 mmol/L (ref 0.5–1.9)

## 2016-12-03 LAB — I-STAT CG4 LACTIC ACID, ED
LACTIC ACID, VENOUS: 2.13 mmol/L — AB (ref 0.5–1.9)
Lactic Acid, Venous: 3 mmol/L (ref 0.5–1.9)

## 2016-12-03 LAB — LIPASE, BLOOD: LIPASE: 12 U/L (ref 11–51)

## 2016-12-03 LAB — TROPONIN I

## 2016-12-03 MED ORDER — OXYCODONE HCL 5 MG PO TABS
7.5000 mg | ORAL_TABLET | Freq: Four times a day (QID) | ORAL | Status: DC
  Administered 2016-12-03 – 2016-12-04 (×3): 7.5 mg via ORAL
  Filled 2016-12-03 (×4): qty 2

## 2016-12-03 MED ORDER — FENTANYL CITRATE (PF) 100 MCG/2ML IJ SOLN
12.5000 ug | INTRAMUSCULAR | Status: DC | PRN
  Administered 2016-12-04 – 2016-12-09 (×5): 12.5 ug via INTRAVENOUS
  Filled 2016-12-03 (×5): qty 2

## 2016-12-03 MED ORDER — FENTANYL CITRATE (PF) 100 MCG/2ML IJ SOLN
50.0000 ug | Freq: Once | INTRAMUSCULAR | Status: AC
Start: 2016-12-03 — End: 2016-12-03
  Administered 2016-12-03: 50 ug via INTRAVENOUS
  Filled 2016-12-03: qty 2

## 2016-12-03 MED ORDER — LEVOTHYROXINE SODIUM 75 MCG PO TABS
150.0000 ug | ORAL_TABLET | Freq: Every day | ORAL | Status: DC
  Administered 2016-12-04 – 2016-12-10 (×7): 150 ug via ORAL
  Filled 2016-12-03 (×2): qty 2
  Filled 2016-12-03: qty 1
  Filled 2016-12-03 (×6): qty 2

## 2016-12-03 MED ORDER — AMLODIPINE BESYLATE 10 MG PO TABS
10.0000 mg | ORAL_TABLET | Freq: Every day | ORAL | Status: DC
  Filled 2016-12-03: qty 1

## 2016-12-03 MED ORDER — IPRATROPIUM-ALBUTEROL 0.5-2.5 (3) MG/3ML IN SOLN
3.0000 mL | Freq: Four times a day (QID) | RESPIRATORY_TRACT | Status: DC
  Administered 2016-12-04: 3 mL via RESPIRATORY_TRACT

## 2016-12-03 MED ORDER — ACETAMINOPHEN 325 MG PO TABS
650.0000 mg | ORAL_TABLET | Freq: Once | ORAL | Status: AC
  Administered 2016-12-03: 650 mg via ORAL
  Filled 2016-12-03: qty 2

## 2016-12-03 MED ORDER — VANCOMYCIN HCL IN DEXTROSE 750-5 MG/150ML-% IV SOLN
750.0000 mg | Freq: Two times a day (BID) | INTRAVENOUS | Status: DC
  Administered 2016-12-04: 750 mg via INTRAVENOUS
  Filled 2016-12-03 (×2): qty 150

## 2016-12-03 MED ORDER — SODIUM CHLORIDE 0.9 % IV BOLUS (SEPSIS)
500.0000 mL | Freq: Once | INTRAVENOUS | Status: AC
  Administered 2016-12-03: 500 mL via INTRAVENOUS

## 2016-12-03 MED ORDER — SODIUM CHLORIDE 0.9 % IV SOLN
INTRAVENOUS | Status: DC
Start: 2016-12-03 — End: 2016-12-10
  Administered 2016-12-03 – 2016-12-09 (×9): via INTRAVENOUS
  Administered 2016-12-10: 1000 mL via INTRAVENOUS

## 2016-12-03 MED ORDER — ONDANSETRON HCL 4 MG/2ML IJ SOLN
4.0000 mg | Freq: Once | INTRAMUSCULAR | Status: AC | PRN
  Administered 2016-12-03: 4 mg via INTRAVENOUS
  Filled 2016-12-03: qty 2

## 2016-12-03 MED ORDER — TRAZODONE HCL 50 MG PO TABS
50.0000 mg | ORAL_TABLET | Freq: Every evening | ORAL | Status: DC | PRN
  Administered 2016-12-09: 50 mg via ORAL
  Filled 2016-12-03 (×2): qty 1

## 2016-12-03 MED ORDER — SODIUM CHLORIDE 0.9 % IV BOLUS (SEPSIS)
1000.0000 mL | Freq: Once | INTRAVENOUS | Status: AC
  Administered 2016-12-03: 1000 mL via INTRAVENOUS

## 2016-12-03 MED ORDER — SODIUM CHLORIDE 0.9% FLUSH
3.0000 mL | Freq: Two times a day (BID) | INTRAVENOUS | Status: DC
  Administered 2016-12-03 – 2016-12-10 (×11): 3 mL via INTRAVENOUS

## 2016-12-03 MED ORDER — DEXTROSE 5 % IV SOLN
1.0000 g | Freq: Once | INTRAVENOUS | Status: AC
  Administered 2016-12-03: 1 g via INTRAVENOUS
  Filled 2016-12-03: qty 10

## 2016-12-03 MED ORDER — DEXTROSE 5 % IV SOLN
1.0000 g | Freq: Three times a day (TID) | INTRAVENOUS | Status: DC
  Administered 2016-12-03 – 2016-12-04 (×3): 1 g via INTRAVENOUS
  Filled 2016-12-03 (×4): qty 1

## 2016-12-03 MED ORDER — HEPARIN SODIUM (PORCINE) 5000 UNIT/ML IJ SOLN
5000.0000 [IU] | Freq: Three times a day (TID) | INTRAMUSCULAR | Status: DC
  Administered 2016-12-03 – 2016-12-10 (×21): 5000 [IU] via SUBCUTANEOUS
  Filled 2016-12-03 (×21): qty 1

## 2016-12-03 MED ORDER — DEXTROSE 5 % IV SOLN
500.0000 mg | Freq: Once | INTRAVENOUS | Status: AC
  Administered 2016-12-03: 500 mg via INTRAVENOUS
  Filled 2016-12-03: qty 500

## 2016-12-03 MED ORDER — PROMETHAZINE HCL 25 MG PO TABS
12.5000 mg | ORAL_TABLET | Freq: Four times a day (QID) | ORAL | Status: DC | PRN
  Filled 2016-12-03: qty 1

## 2016-12-03 MED ORDER — DULOXETINE HCL 60 MG PO CPEP
90.0000 mg | ORAL_CAPSULE | Freq: Every day | ORAL | Status: DC
  Administered 2016-12-05 – 2016-12-10 (×6): 90 mg via ORAL
  Filled 2016-12-03 (×7): qty 1

## 2016-12-03 MED ORDER — VANCOMYCIN HCL 10 G IV SOLR
1250.0000 mg | Freq: Once | INTRAVENOUS | Status: AC
  Administered 2016-12-03: 1250 mg via INTRAVENOUS
  Filled 2016-12-03: qty 1250

## 2016-12-03 MED ORDER — SODIUM CHLORIDE 0.9 % IV BOLUS (SEPSIS)
500.0000 mL | Freq: Once | INTRAVENOUS | Status: AC
  Administered 2016-12-04: 500 mL via INTRAVENOUS

## 2016-12-03 MED ORDER — MOMETASONE FURO-FORMOTEROL FUM 100-5 MCG/ACT IN AERO
2.0000 | INHALATION_SPRAY | Freq: Two times a day (BID) | RESPIRATORY_TRACT | Status: DC
  Administered 2016-12-05 – 2016-12-09 (×9): 2 via RESPIRATORY_TRACT
  Filled 2016-12-03: qty 8.8

## 2016-12-03 MED ORDER — ACETAMINOPHEN 500 MG PO TABS
1000.0000 mg | ORAL_TABLET | Freq: Four times a day (QID) | ORAL | Status: DC | PRN
  Administered 2016-12-03 – 2016-12-07 (×2): 1000 mg via ORAL
  Filled 2016-12-03 (×2): qty 2

## 2016-12-03 MED ORDER — ACETAMINOPHEN 650 MG RE SUPP: 650.0000 mg | Freq: Four times a day (QID) | RECTAL | Status: DC | PRN

## 2016-12-03 MED ORDER — FAMOTIDINE 20 MG PO TABS
20.0000 mg | ORAL_TABLET | Freq: Every day | ORAL | Status: DC
  Administered 2016-12-03 – 2016-12-10 (×8): 20 mg via ORAL
  Filled 2016-12-03 (×10): qty 1

## 2016-12-03 NOTE — ED Notes (Signed)
Respiratory at bedside for arterial stick

## 2016-12-03 NOTE — ED Notes (Signed)
Portable Xray at bedside.

## 2016-12-03 NOTE — ED Provider Notes (Signed)
[facility] DEPT Provider Note   CSN: [id number] Arrival date & time: 12/03/16  [id number]     History   Chief Complaint Chief Complaint  Patient presents with  . Abdominal Pain    HPI [name] is a 71 y.o. male.  He presents with 3 day history of right upper quadrant abdominal pain with increased pain on deep breathing, cough, shortness of breath and malaise. He is using his home medications, without relief. He is status post cholecystectomy,  2 and half weeks ago. He has been healing well. He has been having a cough, for 1 week, both he and his wife have been sick. Yesterday he saw his pulmonologist, and was prescribed Zithromax for "bronchitis". Yesterday, he began vomiting multiple times and vomited once this morning. The emesis has been "like bile". There's been no blood in the emesis. He denies constipation, dysuria, focal, dizziness, or paresthesia. He does not use oxygen at home. He called EMS, who found that he was hypoxic, less than 80%, treated with oxygen and transferred here. He feels somewhat better on arrival. There are no other known modifying factors.  HPI  Past Medical History:  Diagnosis Date  . Anxiety state, unspecified   . Arthritis    osteo arthritis  . Chronic airway obstruction, not elsewhere classified   . Depressive disorder, not elsewhere classified   . GERD (gastroesophageal reflux disease)   . Hoarseness of voice   . Hypertension   . Hypothyroidism   . Osteoporosis   . Reflux gastritis   . S/P radiation therapy 06/14/2015 through 07/30/2015    Glottic larynx 6600 cGy in 33 sessions   . 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 Tobacco chew use     Patient Active Problem List   Diagnosis Date Noted  . CAP (community acquired pneumonia) 12/03/2016  . Cholecystitis 10/30/2016  . Abdominal pain 10/30/2016  . Nausea and vomiting 10/30/2016  . Acute  kidney injury ([place]) 10/30/2016  . Hiatal hernia 10/30/2016  . Osteoporosis 08/11/2016  . Tobacco use disorder 07/30/2015  . 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 Hypogonadism male 02/24/2012  . Crohn's colitis ([place]) 02/24/2012  . GERD 01/14/2011  . ALLERGIC RHINITIS 10/22/2010  . Hypothyroidism 09/16/2007  . Anxiety state 09/16/2007  . DEPRESSION 09/16/2007  . COPD GOLD 0  09/16/2007  . Lung nodule 09/16/2007    Past Surgical History:  Procedure Laterality Date  . ABDOMINAL SURGERY  1971   terminal ileum removed, ileitis   . Biospy of the Right True Vocal Cord Right 04/23/2015  . CHOLECYSTECTOMY N/A 10/31/2016   Procedure: OPEN CHOLECYSTECTOMY;  Surgeon: Rolm Bookbinder, MD;  Location: [facility];  Service: General;  Laterality: N/A;  . MICROLARYNGOSCOPY Right 04/23/2015   Procedure: MICROLARYNGOSCOPY WITH BIOPSY RIGHT TRUE VOCAL CORD;  Surgeon: Izora Gala, MD;  Location: [facility];  Service: ENT;  Laterality: Right;  . SHOULDER SURGERY    . TONSILLECTOMY     as child       Home Medications    Prior to Admission medications   Medication Sig Start Date End Date Taking? Authorizing Provider  albuterol (VENTOLIN HFA) 108 (90 BASE) MCG/ACT inhaler Inhale 2 puffs into the lungs every 6 (six) hours as needed. 05/05/13  Yes Elsie Stain, MD  amLODipine (NORVASC) 10 MG tablet Take 10 mg by mouth daily. 06/06/16  Yes Historical Provider, MD  DULoxetine (CYMBALTA) 30 MG capsule Take 3 capsules by  mouth daily.   Yes Historical Provider, MD  levothyroxine (SYNTHROID, LEVOTHROID) 150 MCG tablet Take 150 mcg by mouth daily.     Yes Historical Provider, MD  mometasone-formoterol (DULERA) 100-5 MCG/ACT AERO Take 2 puffs first thing in am and then another 2 puffs about 12 hours later. 08/11/16  Yes Javier Glazier, MD  nicotine polacrilex (NICORETTE) 4 MG gum Take 4 mg by mouth as needed for smoking cessation.   Yes Historical Provider, MD  ondansetron  (ZOFRAN) 8 MG tablet Take 8 mg by mouth every 8 (eight) hours as needed for nausea. 09/18/16  Yes Historical Provider, MD  oxyCODONE (ROXICODONE) 15 MG immediate release tablet Take 0.5 tablets (7.5 mg total) by mouth 4 (four) times daily. 11/06/16  Yes Robbie Lis, MD  ranitidine (ZANTAC) 150 MG capsule One at bedtime Patient taking differently: Take 150 mg by mouth every evening. One at bedtime 12/08/13  Yes Tanda Rockers, MD  testosterone cypionate (DEPOTESTOTERONE CYPIONATE) 200 MG/ML injection Inject 0.5 mLs into the muscle once a week.   Yes Historical Provider, MD  azithromycin (ZITHROMAX) 250 MG tablet 2 today then one daily Patient not taking: Reported on 12/03/2016 12/02/16   Deneise Lever, MD  Zoledronic Acid (ZOMETA IV) Inject into the vein every 3 (three) months.    Historical Provider, MD    Family History Family History  Problem Relation Age of Onset  . Prostate cancer Father   . Lung cancer Father   . Allergies Sister     Social History Social History  Substance Use Topics  . Smoking status: Former Smoker    Packs/day: 2.00    Years: 43.00    Types: Cigarettes    Quit date: 12/01/2001  . Smokeless tobacco: Never Used     Comment: 2ppd x 43 years.  Still uses nicotine gum. Quit smoking 13 years ago  . Alcohol use No     Allergies   Doxycycline; Guaifenesin; Morphine sulfate; and Tapentadol   Review of Systems Review of Systems  All other systems reviewed and are negative.    Physical Exam Updated Vital Signs BP 105/64   Pulse 84   Temp 98.6 F (37 C)   Resp 20   Ht 6\' 3"  (1.905 m)   Wt 185 lb (83.9 kg)   SpO2 91%   BMI 23.12 kg/m   Physical Exam  Constitutional: He is oriented to person, place, and time. He appears well-developed. He appears toxic. He has a sickly appearance. He appears distressed (He appears short of breath).  HENT:  Head: Normocephalic and atraumatic.  Right Ear: External ear normal.  Left Ear: External ear normal.  Eyes:  Conjunctivae and EOM are normal. Pupils are equal, round, and reactive to light.  Neck: Normal range of motion and phonation normal. Neck supple. No JVD present. No tracheal deviation present.  Cardiovascular: Regular rhythm and normal heart sounds.   Tachycardic  Pulmonary/Chest: No stridor. He exhibits no bony tenderness.  Tachypneic. Decreased air movement, right midlung to base. Few scattered rhonchi.  Abdominal: Soft. There is no tenderness.  Abdomen has a well-healed right upper quadrant scar consistent with recent cholecystectomy. There is no associated redness, drainage or tenderness.  Musculoskeletal: Normal range of motion. He exhibits no edema or tenderness.  Lymphadenopathy:    He has no cervical adenopathy.  Neurological: He is alert and oriented to person, place, and time. No cranial nerve deficit or sensory deficit. He exhibits normal muscle tone. Coordination normal.  Skin:  Skin is warm, dry and intact.  Psychiatric: He has a normal mood and affect. His behavior is normal. Judgment and thought content normal.  Nursing note and vitals reviewed.    ED Treatments / Results  Labs (all labs ordered are listed, but only abnormal results are displayed) Labs Reviewed  COMPREHENSIVE METABOLIC PANEL - Abnormal; Notable for the following:       Result Value   Chloride 99 (*)    Glucose, Bld 105 (*)    Creatinine, Ser 1.43 (*)    Albumin 3.2 (*)    AST 44 (*)    Alkaline Phosphatase 28 (*)    GFR calc non Af Amer 47 (*)    GFR calc Af Amer 55 (*)    All other components within normal limits  CBC - Abnormal; Notable for the following:    RBC 3.74 (*)    Hemoglobin 10.9 (*)    HCT 33.3 (*)    All other components within normal limits  I-STAT CHEM 8, ED - Abnormal; Notable for the following:    Sodium 133 (*)    Chloride 96 (*)    BUN 26 (*)    Creatinine, Ser 1.30 (*)    Glucose, Bld 103 (*)    Calcium, Ion 1.11 (*)    Hemoglobin 11.2 (*)    HCT 33.0 (*)    All other  components within normal limits  I-STAT CG4 LACTIC ACID, ED - Abnormal; Notable for the following:    Lactic Acid, Venous 2.13 (*)    All other components within normal limits  I-STAT ARTERIAL BLOOD GAS, ED - Abnormal; Notable for the following:    pH, Arterial 7.453 (*)    pO2, Arterial 66.0 (*)    All other components within normal limits  CULTURE, BLOOD (ROUTINE X 2)  CULTURE, BLOOD (ROUTINE X 2)  URINE CULTURE  LIPASE, BLOOD  URINALYSIS, ROUTINE W REFLEX MICROSCOPIC  BLOOD GAS, ARTERIAL  I-STAT CG4 LACTIC ACID, ED    EKG  EKG Interpretation None       Radiology Dg Chest Port 1 View  Result Date: 12/03/2016 CLINICAL DATA:  Sudden onset shortness of breath yesterday. EXAM: PORTABLE CHEST 1 VIEW COMPARISON:  10/30/2016 FINDINGS: Consolidation noted in both lower lobes, right greater than left compatible with multifocal pneumonia. Suspect small right effusion. Heart is normal size. IMPRESSION: Bilateral lower lobe airspace consolidation, right greater than left compatible with multifocal pneumonia. Suspect small right effusion. Electronically Signed   By: Rolm Baptise M.D.   On: 12/03/2016 10:57    Procedures Procedures (including critical care time)  Medications Ordered in ED Medications  ondansetron (ZOFRAN) injection 4 mg (not administered)  azithromycin (ZITHROMAX) 500 mg in dextrose 5 % 250 mL IVPB (500 mg Intravenous New Bag/Given 12/03/16 1331)  fentaNYL (SUBLIMAZE) injection 50 mcg (50 mcg Intravenous Given 12/03/16 1229)  sodium chloride 0.9 % bolus 500 mL (0 mLs Intravenous Stopped 12/03/16 1332)  acetaminophen (TYLENOL) tablet 650 mg (650 mg Oral Given 12/03/16 1234)  cefTRIAXone (ROCEPHIN) 1 g in dextrose 5 % 50 mL IVPB (0 g Intravenous Stopped 12/03/16 1330)     Initial Impression / Assessment and Plan / ED Course  I have reviewed the triage vital signs and the nursing notes.  Pertinent labs & imaging results that were available during my care of the patient were  reviewed by me and considered in my medical decision making (see chart for details).  Clinical Course as of Dec 03 1424  Wed Dec 03, 2016  1038 Patient with signs and symptoms of pneumonia versus PE./Infarct. Stabilize patient, and evaluate.  [EW]    Clinical Course User Index [EW] Daleen Bo, MD    Medications  ondansetron [facility]) injection 4 mg (not administered)  azithromycin (ZITHROMAX) 500 mg in dextrose 5 % 250 mL IVPB (500 mg Intravenous New Bag/Given 12/03/16 1331)  fentaNYL (SUBLIMAZE) injection 50 mcg (50 mcg Intravenous Given 12/03/16 1229)  sodium chloride 0.9 % bolus 500 mL (0 mLs Intravenous Stopped 12/03/16 1332)  acetaminophen (TYLENOL) tablet 650 mg (650 mg Oral Given 12/03/16 1234)  cefTRIAXone (ROCEPHIN) 1 g in dextrose 5 % 50 mL IVPB (0 g Intravenous Stopped 12/03/16 1330)    Patient Vitals for the past 24 hrs:  BP Temp Temp src Pulse Resp SpO2 Height Weight  12/03/16 1330 105/64 98.6 F (37 C) - 84 20 91 % - -  12/03/16 1315 112/65 - - 92 18 95 % - -  12/03/16 1241 - - - - - - - 185 lb (83.9 kg)  12/03/16 1230 117/68 - - 92 18 95 % - -  12/03/16 1215 109/66 - - 92 - 95 % - -  12/03/16 1200 127/65 - - 91 - 92 % - -  12/03/16 1145 123/67 101.5 F (38.6 C) Rectal 96 20 94 % - -  12/03/16 1100 108/67 - - 98 18 92 % - -  12/03/16 1045 123/67 - - 98 18 92 % - -  12/03/16 1032 115/67 - - 105 - 91 % - -  12/03/16 1028 - - - - - (!) 88 % 6\' 3"  (1.905 m) 184 lb (83.5 kg)  12/03/16 1020 - - - - - (!) 85 % - -  12/03/16 1016 - - - - - (!) 79 % - -  12/03/16 1015 126/74 100.3 F (37.9 C) Oral 106 18 (!) 80 % - -    12:24 PM Reevaluation with update and discussion. After initial assessment and treatment, an updated evaluation reveals No additional complaints. Wife is here now and cooperates history. Findings discussed with patient and wife, all questions were answered. Michal Callicott L   12:25 PM-Consult complete with Hospitalist. Patient case explained and discussed. He  agrees to admit patient for further evaluation and treatment. Call ended at 14:15  CRITICAL CARE Performed by: Richarda Blade Total critical care time: 45 minutes Critical care time was exclusive of separately billable procedures and treating other patients. Critical care was necessary to treat or prevent imminent or life-threatening deterioration. Critical care was time spent personally by me on the following activities: development of treatment plan with patient and/or surrogate as well as nursing, discussions with consultants, evaluation of patient's response to treatment, examination of patient, obtaining history from patient or surrogate, ordering and performing treatments and interventions, ordering and review of laboratory studies, ordering and review of radiographic studies, pulse oximetry and re-evaluation of patient's condition.   Final Clinical Impressions(s) / ED Diagnoses   Final diagnoses:  Community acquired pneumonia, unspecified laterality  Hypoxia    Patient with signs and symptoms of community acquired pneumonia. Associated hypoxia, improved with supplementation using facemask, 8 L. . Initial lactate, elevated mildly, blood pressure remained stable. Sepsis, tracking initiated, will make determination for additional fluids after second lactate. Recent abdominal surgery, gallbladder, site appears normal. No indicators for wound infection, or retained common duct [place]. He appears mildly dehydrated. Doubt severe sepsis, metabolic instability or impending vascular collapse.  Nursing Notes Reviewed/ Care Coordinated, and  agree without changes. Applicable Imaging Reviewed.  Interpretation of Laboratory Data incorporated into ED treatment  Plan: Admit  New Prescriptions New Prescriptions   No medications on file     Daleen Bo, MD 12/03/16 1428

## 2016-12-03 NOTE — H&P (Signed)
History and Physical    [name] [id number] DOB: 07/09/1946 DOA: 12/03/2016  PCP: Reginia Naas, MD Patient coming from: home  Chief Complaint: abd pain  HPI: [name] is a 71 y.o. male with medical history significant of anxiety, depression, GERD, HTN, hypothyroidism, squamous cell carcinoma of the esophagus presenting with three-day history of right upper quadrant pain. Worse with deep inspiration. Associated with cough and shortness of breath and generalized malaise. Home over-the-counter medications without relief. Patient is approximately 2-1/2 weeks status post laparoscopic converted open cholecystectomy. Incision is healing well per patient. Patient endorses one week of cough. Patient saw his pulmonologist 1 day ago who prescribed Zithromax for bronchitis. Multiple bouts of bilious emesis over the last day. Denies any chest pain, palpitations, dysuria, frequency, rash, fevers. EMS was called to the house to evaluate patient and noted to have oxygen saturations of 80%. [facility] evaluation.   ED Course: Given IV fluids and started on cephalexin and azithromycin. Review of Systems: As per HPI otherwise 10 point review of systems negative.   Ambulatory Status:nml  Past Medical History:  Diagnosis Date  . Anxiety state, unspecified   . Arthritis    osteo arthritis  . Chronic airway obstruction, not elsewhere classified   . Depressive disorder, not elsewhere classified   . GERD (gastroesophageal reflux disease)   . Hoarseness of voice   . Hypertension   . Hypothyroidism   . Osteoporosis   . Reflux gastritis   . S/P radiation therapy 06/14/2015 through 07/30/2015    Glottic larynx 6600 cGy in 33 sessions   . 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 Tobacco chew use     Past Surgical History:  Procedure Laterality Date  . ABDOMINAL SURGERY  1971     terminal ileum removed, ileitis   . Biospy of the Right True Vocal Cord Right 04/23/2015  . CHOLECYSTECTOMY N/A 10/31/2016   Procedure: OPEN CHOLECYSTECTOMY;  Surgeon: Rolm Bookbinder, MD;  Location: [facility];  Service: General;  Laterality: N/A;  . MICROLARYNGOSCOPY Right 04/23/2015   Procedure: MICROLARYNGOSCOPY WITH BIOPSY RIGHT TRUE VOCAL CORD;  Surgeon: Izora Gala, MD;  Location: [facility];  Service: ENT;  Laterality: Right;  . SHOULDER SURGERY    . TONSILLECTOMY     as child    Social History   Social History  . Marital status: Married    Spouse name: N/A  . Number of children: N/A  . Years of education: N/A   Occupational History  . Not on file.   Social History Main Topics  . Smoking status: Former Smoker    Packs/day: 2.00    Years: 43.00    Types: Cigarettes    Quit date: 12/01/2001  . Smokeless tobacco: Never Used     Comment: 2ppd x 43 years.  Still uses nicotine gum. Quit smoking 13 years ago  . Alcohol use No  . Drug use: No  . Sexual activity: Not on file   Other Topics Concern  . Not on file   Social History Narrative   Originally from [place]. Moved to [place] with job relocation in 1996. Married. Previously also lived in [place]. No international travel. 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 Currently has a dog. No bird exposure. No mold exposure. No hot tub exposure. Does wood working with domestic woods.     Allergies  Allergen Reactions  . Doxycycline Nausea And Vomiting  .  Guaifenesin Nausea And Vomiting  . Morphine Sulfate Other (See Comments)  . Tapentadol Other (See Comments)    Family History  Problem Relation Age of Onset  . Prostate cancer Father   . Lung cancer Father   . Allergies Sister     Prior to Admission medications   Medication Sig Start Date End Date Taking? Authorizing Provider  albuterol (VENTOLIN HFA) 108 (90 BASE) MCG/ACT inhaler Inhale 2 puffs into the lungs every 6 (six) hours as  needed. 05/05/13  Yes Elsie Stain, MD  amLODipine (NORVASC) 10 MG tablet Take 10 mg by mouth daily. 06/06/16  Yes Historical Provider, MD  DULoxetine (CYMBALTA) 30 MG capsule Take 3 capsules by mouth daily.   Yes Historical Provider, MD  levothyroxine (SYNTHROID, LEVOTHROID) 150 MCG tablet Take 150 mcg by mouth daily.     Yes Historical Provider, MD  mometasone-formoterol (DULERA) 100-5 MCG/ACT AERO Take 2 puffs first thing in am and then another 2 puffs about 12 hours later. 08/11/16  Yes Javier Glazier, MD  nicotine polacrilex (NICORETTE) 4 MG gum Take 4 mg by mouth as needed for smoking cessation.   Yes Historical Provider, MD  ondansetron (ZOFRAN) 8 MG tablet Take 8 mg by mouth every 8 (eight) hours as needed for nausea. 09/18/16  Yes Historical Provider, MD  oxyCODONE (ROXICODONE) 15 MG immediate release tablet Take 0.5 tablets (7.5 mg total) by mouth 4 (four) times daily. 11/06/16  Yes Robbie Lis, MD  ranitidine (ZANTAC) 150 MG capsule One at bedtime Patient taking differently: Take 150 mg by mouth every evening. One at bedtime 12/08/13  Yes Tanda Rockers, MD  testosterone cypionate (DEPOTESTOTERONE CYPIONATE) 200 MG/ML injection Inject 0.5 mLs into the muscle once a week.   Yes Historical Provider, MD  azithromycin (ZITHROMAX) 250 MG tablet 2 today then one daily Patient not taking: Reported on 12/03/2016 12/02/16   Deneise Lever, MD  Zoledronic Acid (ZOMETA IV) Inject into the vein every 3 (three) months.    Historical Provider, MD    Physical Exam: Vitals:   12/03/16 1530 12/03/16 1545 12/03/16 1600 12/03/16 1615  BP: 141/70 102/64 142/83 (!) 127/115  Pulse: 88 91 93 93  Resp: 22 26 24 23   Temp:      TempSrc:      SpO2: 97% 100% 95% 93%  Weight:      Height:         General:  Ill-appearing, resting in bed Eyes:  PERRL, EOMI, normal lids, iris ENT:  grossly normal hearing, lips & tongue, mmm Neck:  no LAD, masses or thyromegaly Cardiovascular: Tachycardic, regular rate and  rhythm, 2/6 systolic murmur,No LE edema.  Respiratory: Marked increased effort, on Venturi mask, crackles in the bases bilaterally,. Abdomen:  soft, ntnd, NABS Skin:  no rash or induration seen on limited exam Musculoskeletal:  grossly normal tone BUE/BLE, good ROM, no bony abnormality Psychiatric:  grossly normal mood and affect, speech fluent and appropriate, AOx3 Neurologic:  CN 2-12 grossly intact, moves all extremities in coordinated fashion, sensation intact  Labs on Admission: I have personally reviewed following labs and imaging studies  CBC:  Recent Labs Lab 12/03/16 1114 12/03/16 1136  WBC 7.5  --   HGB 10.9* 11.2*  HCT 33.3* 33.0*  MCV 89.0  --   PLT 269  --    Basic Metabolic Panel:  Recent Labs Lab 12/03/16 1114 12/03/16 1136  NA 135 133*  K 3.9 4.5  CL 99* 96*  CO2 27  --  GLUCOSE 105* 103*  BUN 19 26*  CREATININE 1.43* 1.30*  CALCIUM 9.0  --    GFR: Estimated Creatinine Clearance: 61 mL/min (by C-G formula based on SCr of 1.3 mg/dL (H)). Liver Function Tests:  Recent Labs Lab 12/03/16 1114  AST 44*  ALT 38  ALKPHOS 28*  BILITOT 1.0  PROT 6.8  ALBUMIN 3.2*    Recent Labs Lab 12/03/16 1114  LIPASE 12   No results for input(s): AMMONIA in the last 168 hours. Coagulation Profile: No results for input(s): INR, PROTIME in the last 168 hours. Cardiac Enzymes: No results for input(s): CKTOTAL, CKMB, CKMBINDEX, TROPONINI in the last 168 hours. BNP (last 3 results) No results for input(s): PROBNP in the last 8760 hours. HbA1C: No results for input(s): HGBA1C in the last 72 hours. CBG: No results for input(s): GLUCAP in the last 168 hours. Lipid Profile: No results for input(s): CHOL, HDL, LDLCALC, TRIG, CHOLHDL, LDLDIRECT in the last 72 hours. Thyroid Function Tests: No results for input(s): TSH, T4TOTAL, FREET4, T3FREE, THYROIDAB in the last 72 hours. Anemia Panel: No results for input(s): VITAMINB12, FOLATE, FERRITIN, TIBC, IRON,  RETICCTPCT in the last 72 hours. Urine analysis:    Component Value Date/Time   COLORURINE YELLOW 10/30/2016 [place] 10/30/2016 1359   LABSPEC 1.020 10/30/2016 1359   PHURINE 5.5 10/30/2016 1359   GLUCOSEU NEGATIVE 10/30/2016 1359   HGBUR NEGATIVE 10/30/2016 1359   BILIRUBINUR SMALL (A) 10/30/2016 1359   KETONESUR NEGATIVE 10/30/2016 1359   PROTEINUR 30 (A) 10/30/2016 1359   UROBILINOGEN 0.2 01/15/2013 1043   NITRITE NEGATIVE 10/30/2016 1359   LEUKOCYTESUR NEGATIVE 10/30/2016 1359    Creatinine Clearance: Estimated Creatinine Clearance: 61 mL/min (by C-G formula based on SCr of 1.3 mg/dL (H)).  Sepsis Labs: @LABRCNTIP (procalcitonin:4,lacticidven:4) )No results found for this or any previous visit (from the past 240 hour(s)).   Radiological Exams on Admission: Dg Chest Port 1 View  Result Date: 12/03/2016 CLINICAL DATA:  Sudden onset shortness of breath yesterday. EXAM: PORTABLE CHEST 1 VIEW COMPARISON:  10/30/2016 FINDINGS: Consolidation noted in both lower lobes, right greater than left compatible with multifocal pneumonia. Suspect small right effusion. Heart is normal size. IMPRESSION: Bilateral lower lobe airspace consolidation, right greater than left compatible with multifocal pneumonia. Suspect small right effusion. Electronically Signed   By: Rolm Baptise M.D.   On: 12/03/2016 10:57    EKG: Pending  Assessment/Plan Active Problems:   CAP (community acquired pneumonia)   HCAP (healthcare-associated pneumonia)   Sepsis ([place])   Acute respiratory failure ([place])   History of cholecystectomy   Hypertension   Chronic pain   GERD (gastroesophageal reflux disease)   Sepsis/HCAP: bilateral lower lobe. hypoxci respiratory failure.Tachypnea, tachycardic, febrile greater than 11.5, lactic acid climbing from 2.13 - 3. Hypoxic. Started on doxycycline 1 day prior to admission by pulmonologist for bronchitis. - SDU - sepsis/pneumonia ordersets - FLU / Respiratory  viral panel - continue Dulera - DUonebs prn - Vanc Cef - Procalcitonin - IVF - Respiratory viral panel  Cholecystectomy: Lap converted open. Patient is postop approximately 2-1/2 weeks. Evaluated by Dr.Byerly at time of admission. CT scan without abnormality requiring further surgical intervention.  HTN: - resume BP meds in am - Norvasc  Chronic pain: - continue home oxy  GERD: - continue PPI  Hypothyriod: - continue synthroid   DVT prophylaxis: Hep  Code Status: full  Family Communication: wife  Disposition Plan: pending improvement  Consults called: none  Admission status: Inpt - SDU  Parley Pidcock J MD [facility]  If 7PM-7AM, please contact night-coverage [email or web address] Password [email or web address]  12/03/2016, 4:40 PM

## 2016-12-03 NOTE — ED Notes (Addendum)
Pt vomited.  Requesting pain medication and nausea medication.  NS paged Dr. Marily Memos to RN.

## 2016-12-03 NOTE — ED Notes (Signed)
Rocephin paused to await Phlebotomy for Blood Culture Draw prior to Abx Start.

## 2016-12-03 NOTE — ED Triage Notes (Signed)
Per EMS pt from home had gallbladder removal on [id number] without complications. Patient scar healing well no exudate, redness or heat noted. Patient states 2 days ago started having RUQ pain similar to pain prior to surgery. Patient has been nauseated with multiple episodes of green emesis and one episode of diarrhea. Upon EMS arrival patient SpO2 79%/RA pt has no lung disease history and does not wear oxygen. Patient denies cough, shortness of breath, chest pain, or blood in stools. Patient alert and oriented x4.

## 2016-12-03 NOTE — Progress Notes (Signed)
Called ED and spoke with Santiago Glad, RN.  Concerning for level of care pt. Needs with lactic acid going from 2.13 to 3.00 and no orders for repeat lab.  Also pt. Is on a venturi mask, with resp. 20-29, last temp. 101.  Rapid response also called to go check pt. As well.  Will await for return call.  Alphonzo Lemmings, RN

## 2016-12-03 NOTE — ED Notes (Signed)
Pt made aware of bed assignment 

## 2016-12-03 NOTE — ED Notes (Signed)
Spoke with [name], will trial pt off Venti mask to determine bed placement

## 2016-12-03 NOTE — Progress Notes (Signed)
Pharmacy Antibiotic Note  [name] is a 71 y.o. male admitted on 12/03/2016 with sepsis.  Pharmacy has been consulted for vancomycin dosing.  Patient's SCr elevated at 1.43 from baseline labs of 0.6-0.7 in December 2017.  Plan: Vancomycin 1250mg  IV x1, then 750mg  IV q12h Cefepime 1g IV q8h ordered by MD- appropriate Follow c/s, clinical progression Follow renal function closely to trend  Height: 6\' 3"  (190.5 cm) Weight: 185 lb (83.9 kg) IBW/kg (Calculated) : 84.5  Temp (24hrs), Avg:100.1 F (37.8 C), Min:98.6 F (37 C), Max:101.5 F (38.6 C)   Recent Labs Lab 12/03/16 1114 12/03/16 1128 12/03/16 1136 12/03/16 1437  WBC 7.5  --   --   --   CREATININE 1.43*  --  1.30*  --   LATICACIDVEN  --  2.13*  --  3.00*    Estimated Creatinine Clearance: 61 mL/min (by C-G formula based on SCr of 1.3 mg/dL (H)).    Allergies  Allergen Reactions  . Doxycycline Nausea And Vomiting  . Guaifenesin Nausea And Vomiting  . Morphine Sulfate Other (See Comments)  . Tapentadol Other (See Comments)    Antimicrobials this admission: vancomycin 1/3 >>  cefepime 1/3 >>   Dose adjustments this admission: n/a  Microbiology results: 1/3 BCx:  1/3 UCx:   1/3 Sputum:  1/3 Resp panel:     Thank you for allowing pharmacy to be a part of this patient's care.  Kymberlie Brazeau D. Rolondo Pierre, PharmD, BCPS Clinical Pharmacist Pager: [phone] 12/03/2016 5:23 PM

## 2016-12-03 NOTE — ED Notes (Signed)
Unable to get Tylenol out of pyxis.  Message sent to pharmacy to verify.

## 2016-12-03 NOTE — Progress Notes (Signed)
Patient ID: [name], male   DOB: [birth year], 71 y.o.   MRN: [id number]  Pt's wife called our office with concerns of n/v and abdominal pain after lap converted to open subtotal cholecystectomy. His daughter had a common duct [place] after a lap chole and that increased level of anxiety.     He is found in the ED to have bilateral pneumonia.    I came to evaluate abdomen.  He has some soreness in his RUQ, but not severe pain.  His abdomen is soft and non distended.   LFTs are essentially normal, normal white count.  Agree that it seems unlikely that he has abdominal complications from cholecystectomy at this point.  Please let us know if we can be of further assistance.

## 2016-12-03 NOTE — ED Notes (Signed)
Spoke with charge RN on 5W, floor concerned about placement. [facility] p[facility] to discuss

## 2016-12-03 NOTE — ED Notes (Signed)
Pt able to maintain o2sats on 6l [place] and able to go to assigned floor, Rapid Response nurse and 5w charge nurse made aware.

## 2016-12-03 NOTE — Progress Notes (Signed)
CRITICAL VALUE ALERT  Critical value received:  Lactic Acid 2.2  Date of notification: 12/03/2016  Time of notification:  2331  Critical value read back:Yes.    Nurse who received alert:  Lubertha South  MD notified (1st page):  Schorr,NP  Time of first page:  2344  MD notified (2nd page):  Time of second page:  Responding MD:  2348  Time MD responded:  Schorr,NP Placed order for 1 568mL bolus. Will continue to monitor and treat per MD orders.

## 2016-12-04 DIAGNOSIS — G8929 Other chronic pain: Secondary | ICD-10-CM

## 2016-12-04 LAB — CBC
HEMATOCRIT: 29.1 % — AB (ref 39.0–52.0)
HEMOGLOBIN: 9.3 g/dL — AB (ref 13.0–17.0)
MCH: 28.4 pg (ref 26.0–34.0)
MCHC: 32 g/dL (ref 30.0–36.0)
MCV: 88.7 fL (ref 78.0–100.0)
Platelets: 221 10*3/uL (ref 150–400)
RBC: 3.28 MIL/uL — AB (ref 4.22–5.81)
RDW: 14.2 % (ref 11.5–15.5)
WBC: 11 10*3/uL — AB (ref 4.0–10.5)

## 2016-12-04 LAB — RESPIRATORY PANEL BY PCR
ADENOVIRUS-RVPPCR: NOT DETECTED
BORDETELLA PERTUSSIS-RVPCR: NOT DETECTED
CHLAMYDOPHILA PNEUMONIAE-RVPPCR: NOT DETECTED
CORONAVIRUS 229E-RVPPCR: NOT DETECTED
CORONAVIRUS HKU1-RVPPCR: NOT DETECTED
CORONAVIRUS NL63-RVPPCR: DETECTED — AB
Coronavirus OC43: NOT DETECTED
Influenza A: NOT DETECTED
Influenza B: NOT DETECTED
MYCOPLASMA PNEUMONIAE-RVPPCR: NOT DETECTED
Metapneumovirus: NOT DETECTED
PARAINFLUENZA VIRUS 3-RVPPCR: NOT DETECTED
Parainfluenza Virus 1: NOT DETECTED
Parainfluenza Virus 2: NOT DETECTED
Parainfluenza Virus 4: NOT DETECTED
RHINOVIRUS / ENTEROVIRUS - RVPPCR: NOT DETECTED
Respiratory Syncytial Virus: NOT DETECTED

## 2016-12-04 LAB — BASIC METABOLIC PANEL
Anion gap: 7 (ref 5–15)
BUN: 19 mg/dL (ref 6–20)
CHLORIDE: 102 mmol/L (ref 101–111)
CO2: 25 mmol/L (ref 22–32)
Calcium: 7.9 mg/dL — ABNORMAL LOW (ref 8.9–10.3)
Creatinine, Ser: 1.01 mg/dL (ref 0.61–1.24)
GFR calc Af Amer: >60 mL/min (ref >60)
GLUCOSE: 115 mg/dL — AB (ref 65–99)
POTASSIUM: 3.4 mmol/L — AB (ref 3.5–5.1)
Sodium: 134 mmol/L — ABNORMAL LOW (ref 135–145)

## 2016-12-04 LAB — LACTIC ACID, PLASMA: Lactic Acid, Venous: 1.7 mmol/L (ref 0.5–1.9)

## 2016-12-04 LAB — HIV ANTIBODY (ROUTINE TESTING W REFLEX): HIV SCREEN 4TH GENERATION: NONREACTIVE

## 2016-12-04 LAB — MRSA PCR SCREENING: MRSA BY PCR: NEGATIVE

## 2016-12-04 LAB — URINE CULTURE

## 2016-12-04 MED ORDER — IPRATROPIUM-ALBUTEROL 0.5-2.5 (3) MG/3ML IN SOLN
3.0000 mL | Freq: Three times a day (TID) | RESPIRATORY_TRACT | Status: DC
  Administered 2016-12-04 – 2016-12-10 (×19): 3 mL via RESPIRATORY_TRACT
  Filled 2016-12-04 (×20): qty 3

## 2016-12-04 MED ORDER — OXYCODONE HCL 5 MG PO TABS
7.5000 mg | ORAL_TABLET | Freq: Four times a day (QID) | ORAL | Status: DC | PRN
  Administered 2016-12-04: 7.5 mg via ORAL
  Filled 2016-12-04: qty 2

## 2016-12-04 MED ORDER — DULOXETINE HCL 60 MG PO CPEP
90.0000 mg | ORAL_CAPSULE | Freq: Once | ORAL | Status: AC
  Administered 2016-12-04: 90 mg via ORAL
  Filled 2016-12-04: qty 1

## 2016-12-04 MED ORDER — VANCOMYCIN HCL IN DEXTROSE 1-5 GM/200ML-% IV SOLN
1000.0000 mg | Freq: Two times a day (BID) | INTRAVENOUS | Status: DC
  Administered 2016-12-04 – 2016-12-05 (×2): 1000 mg via INTRAVENOUS
  Filled 2016-12-04 (×3): qty 200

## 2016-12-04 MED ORDER — ORAL CARE MOUTH RINSE
15.0000 mL | Freq: Two times a day (BID) | OROMUCOSAL | Status: DC
  Administered 2016-12-04 – 2016-12-07 (×5): 15 mL via OROMUCOSAL

## 2016-12-04 MED ORDER — CHLORPROMAZINE HCL 25 MG PO TABS
25.0000 mg | ORAL_TABLET | Freq: Three times a day (TID) | ORAL | Status: DC | PRN
  Administered 2016-12-04 – 2016-12-10 (×9): 25 mg via ORAL
  Filled 2016-12-04 (×14): qty 1

## 2016-12-04 MED ORDER — PIPERACILLIN-TAZOBACTAM 3.375 G IVPB
3.3750 g | Freq: Three times a day (TID) | INTRAVENOUS | Status: DC
  Administered 2016-12-04 – 2016-12-10 (×19): 3.375 g via INTRAVENOUS
  Filled 2016-12-04 (×22): qty 50

## 2016-12-04 NOTE — Progress Notes (Signed)
Pharmacy Antibiotic Note  [name] is a 71 y.o. male admitted on 12/03/2016 with sepsis 2nd HCAP.  Pharmacy has been consulted for vancomycin and zosyn dosing.  sepsis 2nd HCAP, WBC 7.5> 11, creat 1.3>>1,normalized creat cl 69, AF, + resp virus Coronavirus; CXR 1/3: B LL consolidation, d/w multifocal PNA.   Plan: Increase Vancomycin to 1000 mg IV q12h Continue zosyn 3.375 gm IV q8h, 4 hr infusion Follow c/s, clinical progression Follow renal function closely to trend Vancomycin level as needed  Height: 6\' 3"  (190.5 cm) Weight: 185 lb (83.9 kg) IBW/kg (Calculated) : 84.5  Temp (24hrs), Avg:99.2 F (37.3 C), Min:97.8 F (36.6 C), Max:101 F (38.3 C)   Recent Labs Lab 12/03/16 1114 12/03/16 1128 12/03/16 1136 12/03/16 1437 12/03/16 2022 12/03/16 2233 12/04/16 0500  WBC 7.5  --   --   --   --   --  11.0*  CREATININE 1.43*  --  1.30*  --   --   --  1.01  LATICACIDVEN  --  2.13*  --  3.00* 1.8 2.2*  --     Estimated Creatinine Clearance: 78.5 mL/min (by C-G formula based on SCr of 1.01 mg/dL).    Allergies  Allergen Reactions  . Doxycycline Nausea And Vomiting  . Guaifenesin Nausea And Vomiting  . Morphine Sulfate Other (See Comments)  . Tapentadol Other (See Comments)   Antimicrobials this admission: vancomycin 1/3 >>  cefepime 1/3 >> 1/4 Zosyn 1/4>> Azith x 1 1/3   Dose adjustments this admission: n/a   Microbiology results: 1/3 BCx: ip 1/3 UCx:  ip 1/3 Sputum:  1/3 Resp panel:  + for coronavirus HIV neg Strep pneumo Uag neg  Eudelia Bunch, Pharm.D. BP:[phone] 12/04/2016 2:19 PM

## 2016-12-04 NOTE — Progress Notes (Signed)
Writer spoke with Dr. [facility]ron [facility] regarding patient's lethargy. Cymbalta, norvasc, and pain medication held. In addition he is recommending a step down bed and respiratory therapy and states he will order a repeat lactic. Respiratory therapy with patient. Wife at bedside and aware of plan. Rapid response RN aware of patient and will continue to follow up. Writer will continue to monitor. Wyonia Hough

## 2016-12-04 NOTE — Progress Notes (Signed)
Pt had 10 beats of SVT notified by tele tech, RN reviewed leads, pt non symptomatic, was just moving around in the bed. RN notified event to [facility] coverage NP K Baltazar Najjar.

## 2016-12-04 NOTE — Progress Notes (Signed)
PROGRESS NOTE                                                                                                                                                                                                             Patient Demographics:    [name], is a 71 y.o. male, DOB - 11/08/1946, [id number]  Admit date - 12/03/2016   Admitting Physician Waldemar Dickens, MD  Outpatient Primary MD for the patient is Reginia Naas, MD  LOS - 1    Chief Complaint  Patient presents with  . Abdominal Pain       Brief Narrative   [name] is a 72 y.o. male with medical history significant of anxiety, depression, GERD, HTN, hypothyroidism, squamous cell carcinoma of the esophagus presenting with three-day history of right upper quadrant pain. Worse with deep inspiration. Associated with cough and shortness of breath and generalized malaise, work up significant for HCAP.   Subjective:    [name] today has, No headache, No chest pain,  Reports shortness of breath, cough, no further nausea or vomiting since admission , reports abdominal pain is improving.   Assessment  & Plan :    Active Problems:   CAP (community acquired pneumonia)   HCAP (healthcare-associated pneumonia)   Sepsis ([place])   Acute respiratory failure ([place])   History of cholecystectomy   Hypertension   Chronic pain   GERD (gastroesophageal reflux disease)   Sepsis secondary to HCAP - Febrile, tachycardic, tachypneic with elevated lactic acid and acute hypoxic respiratory failure  - Recent hospitalization , as well as nausea and vomiting , continue with vancomycin, change cefepime to Zosyn for possible aspiration . - Respiratory panel growing coronavirus, so most likely viral component to respiratory failure  - Continue to trend lactic acids, continue with IV fluids  - Upgrade to stepdown  - Initially hypotensive, hold antihypertensive medication, improving with IV  fluids   acute hypoxic respiratory failure  - Continue with oxygen, secondary to above   Cholecystectomy:  -Lap converted open. Patient is postop approximately 2-1/2 weeks. Evaluated by Dr.Byerly at time of admission. Unlikely abdominal complication from cholecystectomy at this point  HTN: - Blood pressure on the lower side, continue to hold meds  Chronic pain: - continue home oxy  GERD: - continue PPI  Hypothyriod: - continue synthroid  Code Status : Full  Family Communication  : None at bedside  Disposition Plan  : pending further work up.  Consults  :  None  Procedures  : None  DVT Prophylaxis  :  Heparin - SCDs   Lab Results  Component Value Date   PLT 221 12/04/2016    Antibiotics  :    Anti-infectives    Start     Dose/Rate Route Frequency Ordered Stop   12/04/16 1400  piperacillin-tazobactam (ZOSYN) IVPB 3.375 g     3.375 g 12.5 mL/hr over 240 Minutes Intravenous Every 8 hours 12/04/16 0742     12/04/16 0500  vancomycin (VANCOCIN) IVPB 750 mg/150 ml premix     750 mg 150 mL/hr over 60 Minutes Intravenous Every 12 hours 12/03/16 1727     12/03/16 1700  ceFEPIme (MAXIPIME) 1 g in dextrose 5 % 50 mL IVPB  Status:  Discontinued     1 g 100 mL/hr over 30 Minutes Intravenous Every 8 hours 12/03/16 1637 12/04/16 0713   12/03/16 1700  vancomycin (VANCOCIN) 1,250 mg in sodium chloride 0.9 % 250 mL IVPB     1,250 mg 166.7 mL/hr over 90 Minutes Intravenous  Once 12/03/16 1653 12/03/16 2010   12/03/16 1215  cefTRIAXone (ROCEPHIN) 1 g in dextrose 5 % 50 mL IVPB     1 g 100 mL/hr over 30 Minutes Intravenous  Once 12/03/16 1209 12/03/16 1330   12/03/16 1215  azithromycin (ZITHROMAX) 500 mg in dextrose 5 % 250 mL IVPB     500 mg 250 mL/hr over 60 Minutes Intravenous  Once 12/03/16 1209 12/03/16 1440        Objective:   Vitals:   12/04/16 0917 12/04/16 0922 12/04/16 1025 12/04/16 1237  BP: (!) 95/59 116/68  126/65  Pulse:      Resp:      Temp:        TempSrc:      SpO2:   94%   Weight:      Height:        Wt Readings from Last 3 Encounters:  12/03/16 83.9 kg (185 lb)  12/02/16 83.6 kg (184 lb 6.4 oz)  11/06/16 90.9 kg (200 lb 6.4 oz)     Intake/Output Summary (Last 24 hours) at 12/04/16 1346 Last data filed at 12/04/16 0639  Gross per 24 hour  Intake          3049.67 ml  Output                0 ml  Net          3049.67 ml     Physical Exam  Awake Alert, Oriented X 3, In mild respiratory distress Supple Neck,No JVD, Symmetrical Chest wall movement,fair  air movement bilaterally, bibasilar rhonchi, no wheezing RRR,No Gallops,Rubs or new Murmurs, No Parasternal Heave +ve B.Sounds, Abd Soft, mild tenderness in right upper quadrant, No rebound - guarding or rigidity. No Cyanosis, Clubbing or edema, No new Rash or bruise      Data Review:    CBC  Recent Labs Lab 12/03/16 1114 12/03/16 1136 12/04/16 0500  WBC 7.5  --  11.0*  HGB 10.9* 11.2* 9.3*  HCT 33.3* 33.0* 29.1*  PLT 269  --  221  MCV 89.0  --  88.7  MCH 29.1  --  28.4  MCHC 32.7  --  32.0  RDW 14.1  --  14.2    Chemistries   Recent Labs Lab 12/03/16 1114 12/03/16  1136 12/04/16 0500  NA 135 133* 134*  K 3.9 4.5 3.4*  CL 99* 96* 102  CO2 27  --  25  GLUCOSE 105* 103* 115*  BUN 19 26* 19  CREATININE 1.43* 1.30* 1.01  CALCIUM 9.0  --  7.9*  AST 44*  --   --   ALT 38  --   --   ALKPHOS 28*  --   --   BILITOT 1.0  --   --    ------------------------------------------------------------------------------------------------------------------ No results for input(s): CHOL, HDL, LDLCALC, TRIG, CHOLHDL, LDLDIRECT in the last 72 hours.  No results found for: HGBA1C ------------------------------------------------------------------------------------------------------------------ No results for input(s): TSH, T4TOTAL, T3FREE, THYROIDAB in the last 72 hours.  Invalid input(s):  FREET3 ------------------------------------------------------------------------------------------------------------------ No results for input(s): VITAMINB12, FOLATE, FERRITIN, TIBC, IRON, RETICCTPCT in the last 72 hours.  Coagulation profile  Recent Labs Lab 12/03/16 1809  INR 1.24    No results for input(s): DDIMER in the last 72 hours.  Cardiac Enzymes  Recent Labs Lab 12/03/16 1809  TROPONINI <0.03   ------------------------------------------------------------------------------------------------------------------ No results found for: BNP  Inpatient Medications  Scheduled Meds: . DULoxetine  90 mg Oral Daily  . DULoxetine  90 mg Oral Once  . famotidine  20 mg Oral Daily  . heparin  5,000 Units Subcutaneous Q8H  . ipratropium-albuterol  3 mL Nebulization TID  . levothyroxine  150 mcg Oral QAC breakfast  . mouth rinse  15 mL Mouth Rinse BID  . mometasone-formoterol  2 puff Inhalation BID  . piperacillin-tazobactam (ZOSYN)  IV  3.375 g Intravenous Q8H  . sodium chloride flush  3 mL Intravenous Q12H  . vancomycin  750 mg Intravenous Q12H   Continuous Infusions: . sodium chloride 100 mL/hr at 12/04/16 0518   PRN Meds:.acetaminophen **OR** acetaminophen, chlorproMAZINE, fentaNYL (SUBLIMAZE) injection, oxyCODONE, promethazine, traZODone  Micro Results Recent Results (from the past 240 hour(s))  Respiratory Panel by PCR     Status: Abnormal   Collection Time: 12/04/16 12:04 AM  Result Value Ref Range Status   Adenovirus NOT DETECTED NOT DETECTED Final   Coronavirus 229E NOT DETECTED NOT DETECTED Final   Coronavirus HKU1 NOT DETECTED NOT DETECTED Final   Coronavirus NL63 DETECTED (A) NOT DETECTED Final   Coronavirus OC43 NOT DETECTED NOT DETECTED Final   Metapneumovirus NOT DETECTED NOT DETECTED Final   Rhinovirus / Enterovirus NOT DETECTED NOT DETECTED Final   Influenza A NOT DETECTED NOT DETECTED Final   Influenza B NOT DETECTED NOT DETECTED Final    Parainfluenza Virus 1 NOT DETECTED NOT DETECTED Final   Parainfluenza Virus 2 NOT DETECTED NOT DETECTED Final   Parainfluenza Virus 3 NOT DETECTED NOT DETECTED Final   Parainfluenza Virus 4 NOT DETECTED NOT DETECTED Final   Respiratory Syncytial Virus NOT DETECTED NOT DETECTED Final   Bordetella pertussis NOT DETECTED NOT DETECTED Final   Chlamydophila pneumoniae NOT DETECTED NOT DETECTED Final   Mycoplasma pneumoniae NOT DETECTED NOT DETECTED Final    Radiology Reports Dg Chest Port 1 View  Result Date: 12/03/2016 CLINICAL DATA:  Sudden onset shortness of breath yesterday. EXAM: PORTABLE CHEST 1 VIEW COMPARISON:  10/30/2016 FINDINGS: Consolidation noted in both lower lobes, right greater than left compatible with multifocal pneumonia. Suspect small right effusion. Heart is normal size. IMPRESSION: Bilateral lower lobe airspace consolidation, right greater than left compatible with multifocal pneumonia. Suspect small right effusion. Electronically Signed   By: Rolm Baptise M.D.   On: 12/03/2016 10:57    Raychel Dowler M.D on 12/04/2016 at  1:46 PM  Between 7am to 7pm - Pager - [phone]  After 7pm go to [email or web address] - password [facility]  [facility] -  Office  [phone]

## 2016-12-04 NOTE — Progress Notes (Addendum)
NURSING PROGRESS NOTE  Layden Fugett StoneMRN: [id number] Admission Data: 12/04/2015 at 10:15PM Attending Provider: Waldemar Dickens, MD PCP: Reginia Naas, MD Code status: Full  Allergies:  Allergies  Allergen Reactions  . Doxycycline Nausea And Vomiting  . Guaifenesin Nausea And Vomiting  . Morphine Sulfate Other (See Comments)  . Tapentadol Other (See Comments)   Past Medical History:  Past Medical History:  Diagnosis Date  . Anxiety state, unspecified   . Arthritis    osteo arthritis  . Chronic airway obstruction, not elsewhere classified   . Depressive disorder, not elsewhere classified   . GERD (gastroesophageal reflux disease)   . Hoarseness of voice   . Hypertension   . Hypothyroidism   . Osteoporosis   . Reflux gastritis   . S/P radiation therapy 06/14/2015 through 07/30/2015    Glottic larynx 6600 cGy in 33 sessions   . 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 Tobacco chew use    Past Surgical History:  Past Surgical History:  Procedure Laterality Date  . ABDOMINAL SURGERY  1971   terminal ileum removed, ileitis   . Biospy of the Right True Vocal Cord Right 04/23/2015  . CHOLECYSTECTOMY N/A 10/31/2016   Procedure: OPEN CHOLECYSTECTOMY;  Surgeon: Rolm Bookbinder, MD;  Location: [facility];  Service: General;  Laterality: N/A;  . MICROLARYNGOSCOPY Right 04/23/2015   Procedure: MICROLARYNGOSCOPY WITH BIOPSY RIGHT TRUE VOCAL CORD;  Surgeon: Izora Gala, MD;  Location: [place];  Service: ENT;  Laterality: Right;  . SHOULDER SURGERY    . TONSILLECTOMY     as child   [name] is a 71 y.o. male patient, arrived to floor in room 5W06 via stretcher, transferred from ED. Patient alert and oriented X 4. No acute distress noted. Complains of pain 4-5/10 RUQ abdominal pain.   Vital signs: Oral temperature 97.8 F (36.6 C), Blood pressure 112/63, Pulse  81, RR 20, SpO2 96 % on 6 liters of oxygen via nasal cannula.   Cardiac monitoring: Telemetry box 5W # 12 in place.Second verified by Juanna Cao, RN  IV access: Left AC and hand; condition patent and no redness. Left AC infusing normal saline at 174mL/hr  Skin: MSAD noted on scrotum, sacrum, and groin area. Skin tear noted on mid sacrum. Abdominal incision from a gallbladder surgery a few weeks ago. Second verified by [place]., RN    Patient's ID armband verified with patient and in place. Information packet given to patient. Fall risk assessed, SR up X2, patient able to verbalize understanding of risks associated with falls and to call nurse or staff to assist before getting out of bed. Patient oriented to room and equipment. Call bell within reach.

## 2016-12-04 NOTE — Progress Notes (Signed)
Patient very lethargic and drifting off mid conversation. When he is woken up he is alert and oriented. Holding pain medication at this time. Dr. Waldron Labs notified. Writer also notified provider of increased lactic overnight. Will await orders and continue to monitor. [name]

## 2016-12-04 NOTE — Progress Notes (Signed)
Pharmacy Antibiotic Note  [name] is a 71 y.o. male admitted on 12/03/2016 with pneumonia.  Pharmacy has been consulted to add Zosyn.  Plan: Zosyn 3.375g IV q8h (4 hour infusion).  Height: 6\' 3"  (190.5 cm) Weight: 185 lb (83.9 kg) IBW/kg (Calculated) : 84.5  Temp (24hrs), Avg:99.5 F (37.5 C), Min:97.8 F (36.6 C), Max:101.5 F (38.6 C)   Recent Labs Lab 12/03/16 1114 12/03/16 1128 12/03/16 1136 12/03/16 1437 12/03/16 2022 12/03/16 2233 12/04/16 0500  WBC 7.5  --   --   --   --   --  11.0*  CREATININE 1.43*  --  1.30*  --   --   --  1.01  LATICACIDVEN  --  2.13*  --  3.00* 1.8 2.2*  --     Estimated Creatinine Clearance: 78.5 mL/min (by C-G formula based on SCr of 1.01 mg/dL).    Allergies  Allergen Reactions  . Doxycycline Nausea And Vomiting  . Guaifenesin Nausea And Vomiting  . Morphine Sulfate Other (See Comments)  . Tapentadol Other (See Comments)    Thank you for allowing pharmacy to be a part of this patient's care.  Wynona Neat, PharmD, BCPS  12/04/2016 7:40 AM

## 2016-12-04 NOTE — Progress Notes (Signed)
Telemetry called and informed RN that patient had a burst of SVT with HR in the 160s. Patient was asymptomatic and vital signs afterwards are located below.Vital signs stable.Schorr,NP notified.    12/03/16 2346  Vitals  Temp 98.9 F (37.2 C)  Temp Source Oral  BP 121/76  MAP (mmHg) 86  BP Method Automatic  Pulse Rate 76  Pulse Rate Source Monitor  Resp (!) 22  Oxygen Therapy  SpO2 100 %  O2 Device Nasal Cannula  O2 Flow Rate (L/min) 6 L/min

## 2016-12-04 NOTE — Progress Notes (Signed)
Report given to Earlie Server, RN on [place].

## 2016-12-05 ENCOUNTER — Inpatient Hospital Stay (HOSPITAL_COMMUNITY): Payer: Medicare Other

## 2016-12-05 LAB — CBC
HCT: 25.2 % — ABNORMAL LOW (ref 39.0–52.0)
HEMOGLOBIN: 8.3 g/dL — AB (ref 13.0–17.0)
MCH: 29 pg (ref 26.0–34.0)
MCHC: 32.9 g/dL (ref 30.0–36.0)
MCV: 88.1 fL (ref 78.0–100.0)
PLATELETS: 217 10*3/uL (ref 150–400)
RBC: 2.86 MIL/uL — ABNORMAL LOW (ref 4.22–5.81)
RDW: 14 % (ref 11.5–15.5)
WBC: 11.5 10*3/uL — ABNORMAL HIGH (ref 4.0–10.5)

## 2016-12-05 LAB — BASIC METABOLIC PANEL
Anion gap: 8 (ref 5–15)
BUN: 11 mg/dL (ref 6–20)
CHLORIDE: 103 mmol/L (ref 101–111)
CO2: 23 mmol/L (ref 22–32)
CREATININE: 0.75 mg/dL (ref 0.61–1.24)
Calcium: 7.8 mg/dL — ABNORMAL LOW (ref 8.9–10.3)
GFR calc non Af Amer: >60 mL/min (ref >60)
Glucose, Bld: 118 mg/dL — ABNORMAL HIGH (ref 65–99)
Potassium: 3.5 mmol/L (ref 3.5–5.1)
SODIUM: 134 mmol/L — AB (ref 135–145)

## 2016-12-05 LAB — EXPECTORATED SPUTUM ASSESSMENT W GRAM STAIN, RFLX TO RESP C

## 2016-12-05 LAB — EXPECTORATED SPUTUM ASSESSMENT W REFEX TO RESP CULTURE

## 2016-12-05 LAB — MAGNESIUM: Magnesium: 1.8 mg/dL (ref 1.7–2.4)

## 2016-12-05 MED ORDER — POTASSIUM CHLORIDE CRYS ER 20 MEQ PO TBCR
40.0000 meq | EXTENDED_RELEASE_TABLET | Freq: Once | ORAL | Status: AC
  Administered 2016-12-05: 40 meq via ORAL
  Filled 2016-12-05: qty 2

## 2016-12-05 MED ORDER — OXYCODONE HCL 5 MG PO TABS
15.0000 mg | ORAL_TABLET | Freq: Four times a day (QID) | ORAL | Status: DC | PRN
  Administered 2016-12-05 – 2016-12-10 (×19): 15 mg via ORAL
  Filled 2016-12-05 (×19): qty 3

## 2016-12-05 NOTE — Progress Notes (Signed)
PROGRESS NOTE                                                                                                                                                                                                             Patient Demographics:    Mohmmad Thissell, is a 71 y.o. male, DOB - 18-Dec-1945, [id number]  Admit date - 12/03/2016   Admitting Physician Waldemar Dickens, MD  Outpatient Primary MD for the patient is Reginia Naas, MD  LOS - 2    Chief Complaint  Patient presents with  . Abdominal Pain       Brief Narrative   ISAEL MIKLAS is a 71 y.o. male with medical history significant of anxiety, depression, GERD, HTN, hypothyroidism, squamous cell carcinoma of the esophagus presenting with three-day history of right upper quadrant pain. Worse with deep inspiration. Associated with cough and shortness of breath and generalized malaise, work up significant for HCAP.   Subjective:    [name] today has, No headache, No chest pain,  Reports shortness of breath But is improving, cough, no further nausea or vomiting since admission.   Assessment  & Plan :    Active Problems:   CAP (community acquired pneumonia)   HCAP (healthcare-associated pneumonia)   Sepsis ([place])   Acute respiratory failure ([place])   History of cholecystectomy   Hypertension   Chronic pain   GERD (gastroesophageal reflux disease)   Sepsis secondary to HCAP - Febrile, tachycardic, tachypneic with elevated lactic acid and acute hypoxic respiratory failure  - Recent hospitalization , as well as nausea and vomiting , continue with vancomycin, changed cefepime to Zosyn for possible aspiration . - Respiratory panel growing coronavirus, so most likely viral component to respiratory failure  - lactic acid normalized. - remains in  stepdown  - Initially hypotensive, hold antihypertensive medication, improving with IV fluids   acute hypoxic respiratory failure  -  Continue with oxygen, secondary to above , improving, today in 2-3 L nasal cannula was on 6 L yesterday  Cholecystectomy:  -Lap converted open. Patient is postop approximately 2-1/2 weeks. Evaluated by Dr.Byerly at time of admission. Unlikely abdominal complication from cholecystectomy at this point  HTN: - Blood pressure on the lower side, continue to hold meds  Chronic pain: - continue home oxy, but will change to when necessary  GERD: -  continue PPI  Hypothyriod: - continue synthroid  Code Status : Full  Family Communication  : Wife at bedside 1/4  Disposition Plan  : pending further work up.  Consults  :  None  Procedures  : None  DVT Prophylaxis  :  Heparin - SCDs   Lab Results  Component Value Date   PLT 217 12/05/2016    Antibiotics  :    Anti-infectives    Start     Dose/Rate Route Frequency Ordered Stop   12/04/16 1800  vancomycin (VANCOCIN) IVPB 1000 mg/200 mL premix     1,000 mg 200 mL/hr over 60 Minutes Intravenous Every 12 hours 12/04/16 1417     12/04/16 1400  piperacillin-tazobactam (ZOSYN) IVPB 3.375 g     3.375 g 12.5 mL/hr over 240 Minutes Intravenous Every 8 hours 12/04/16 0742     12/04/16 0500  vancomycin (VANCOCIN) IVPB 750 mg/150 ml premix  Status:  Discontinued     750 mg 150 mL/hr over 60 Minutes Intravenous Every 12 hours 12/03/16 1727 12/04/16 1417   12/03/16 1700  ceFEPIme (MAXIPIME) 1 g in dextrose 5 % 50 mL IVPB  Status:  Discontinued     1 g 100 mL/hr over 30 Minutes Intravenous Every 8 hours 12/03/16 1637 12/04/16 0713   12/03/16 1700  vancomycin (VANCOCIN) 1,250 mg in sodium chloride 0.9 % 250 mL IVPB     1,250 mg 166.7 mL/hr over 90 Minutes Intravenous  Once 12/03/16 1653 12/03/16 2010   12/03/16 1215  cefTRIAXone (ROCEPHIN) 1 g in dextrose 5 % 50 mL IVPB     1 g 100 mL/hr over 30 Minutes Intravenous  Once 12/03/16 1209 12/03/16 1330   12/03/16 1215  azithromycin (ZITHROMAX) 500 mg in dextrose 5 % 250 mL IVPB     500  mg 250 mL/hr over 60 Minutes Intravenous  Once 12/03/16 1209 12/03/16 1440        Objective:   Vitals:   12/05/16 0500 12/05/16 0600 12/05/16 0734 12/05/16 0844  BP: 128/72 105/68 123/78   Pulse: 79 81    Resp: 19 20    Temp:   98.3 F (36.8 C)   TempSrc:   Oral   SpO2: 94% 93%  94%  Weight:      Height:        Wt Readings from Last 3 Encounters:  12/03/16 83.9 kg (185 lb)  12/02/16 83.6 kg (184 lb 6.4 oz)  11/06/16 90.9 kg (200 lb 6.4 oz)     Intake/Output Summary (Last 24 hours) at 12/05/16 1217 Last data filed at 12/05/16 1000  Gross per 24 hour  Intake             2915 ml  Output             1450 ml  Net             1465 ml     Physical Exam  Awake Alert, Oriented X 3,  Supple Neck,No JVD, Symmetrical Chest wall movement,fair  air movement bilaterally, bibasilar rhonchi, no wheezing RRR,No Gallops,Rubs or new Murmurs, No Parasternal Heave +ve B.Sounds, Abd Soft, mild tenderness in right upper quadrant, No rebound - guarding or rigidity. No Cyanosis, Clubbing or edema, No new Rash or bruise      Data Review:    CBC  Recent Labs Lab 12/03/16 1114 12/03/16 1136 12/04/16 0500 12/05/16 0619  WBC 7.5  --  11.0* 11.5*  HGB 10.9* 11.2* 9.3* 8.3*  HCT 33.3*  33.0* 29.1* 25.2*  PLT 269  --  221 217  MCV 89.0  --  88.7 88.1  MCH 29.1  --  28.4 29.0  MCHC 32.7  --  32.0 32.9  RDW 14.1  --  14.2 14.0    Chemistries   Recent Labs Lab 12/03/16 1114 12/03/16 1136 12/04/16 0500 12/05/16 0619  NA 135 133* 134* 134*  K 3.9 4.5 3.4* 3.5  CL 99* 96* 102 103  CO2 27  --  25 23  GLUCOSE 105* 103* 115* 118*  BUN 19 26* 19 11  CREATININE 1.43* 1.30* 1.01 0.75  CALCIUM 9.0  --  7.9* 7.8*  AST 44*  --   --   --   ALT 38  --   --   --   ALKPHOS 28*  --   --   --   BILITOT 1.0  --   --   --    ------------------------------------------------------------------------------------------------------------------ No results for input(s): CHOL, HDL, LDLCALC,  TRIG, CHOLHDL, LDLDIRECT in the last 72 hours.  No results found for: HGBA1C ------------------------------------------------------------------------------------------------------------------ No results for input(s): TSH, T4TOTAL, T3FREE, THYROIDAB in the last 72 hours.  Invalid input(s): FREET3 ------------------------------------------------------------------------------------------------------------------ No results for input(s): VITAMINB12, FOLATE, FERRITIN, TIBC, IRON, RETICCTPCT in the last 72 hours.  Coagulation profile  Recent Labs Lab 12/03/16 1809  INR 1.24    No results for input(s): DDIMER in the last 72 hours.  Cardiac Enzymes  Recent Labs Lab 12/03/16 1809  TROPONINI <0.03   ------------------------------------------------------------------------------------------------------------------ No results found for: BNP  Inpatient Medications  Scheduled Meds: . DULoxetine  90 mg Oral Daily  . famotidine  20 mg Oral Daily  . heparin  5,000 Units Subcutaneous Q8H  . ipratropium-albuterol  3 mL Nebulization TID  . levothyroxine  150 mcg Oral QAC breakfast  . mouth rinse  15 mL Mouth Rinse BID  . mometasone-formoterol  2 puff Inhalation BID  . piperacillin-tazobactam (ZOSYN)  IV  3.375 g Intravenous Q8H  . potassium chloride  40 mEq Oral Once  . sodium chloride flush  3 mL Intravenous Q12H  . vancomycin  1,000 mg Intravenous Q12H   Continuous Infusions: . sodium chloride 100 mL/hr at 12/05/16 0523   PRN Meds:.acetaminophen **OR** acetaminophen, chlorproMAZINE, fentaNYL (SUBLIMAZE) injection, oxyCODONE, promethazine, traZODone  Micro Results Recent Results (from the past 240 hour(s))  Culture, blood (routine x 2)     Status: None (Preliminary result)   Collection Time: 12/03/16 12:40 PM  Result Value Ref Range Status   Specimen Description BLOOD RIGHT ANTECUBITAL  Final   Special Requests BOTTLES DRAWN AEROBIC AND ANAEROBIC  5CC  Final   Culture NO GROWTH 1  DAY  Final   Report Status PENDING  Incomplete  Culture, blood (routine x 2)     Status: None (Preliminary result)   Collection Time: 12/03/16 12:49 PM  Result Value Ref Range Status   Specimen Description BLOOD LEFT HAND  Final   Special Requests IN PEDIATRIC BOTTLE  2CC  Final   Culture NO GROWTH 1 DAY  Final   Report Status PENDING  Incomplete  Urine culture     Status: Abnormal   Collection Time: 12/03/16  7:39 PM  Result Value Ref Range Status   Specimen Description URINE, CLEAN CATCH  Final   Special Requests NONE  Final   Culture MULTIPLE SPECIES PRESENT, SUGGEST RECOLLECTION (A)  Final   Report Status 12/04/2016 FINAL  Final  Respiratory Panel by PCR     Status: Abnormal  Collection Time: 12/04/16 12:04 AM  Result Value Ref Range Status   Adenovirus NOT DETECTED NOT DETECTED Final   Coronavirus 229E NOT DETECTED NOT DETECTED Final   Coronavirus HKU1 NOT DETECTED NOT DETECTED Final   Coronavirus NL63 DETECTED (A) NOT DETECTED Final   Coronavirus OC43 NOT DETECTED NOT DETECTED Final   Metapneumovirus NOT DETECTED NOT DETECTED Final   Rhinovirus / Enterovirus NOT DETECTED NOT DETECTED Final   Influenza A NOT DETECTED NOT DETECTED Final   Influenza B NOT DETECTED NOT DETECTED Final   Parainfluenza Virus 1 NOT DETECTED NOT DETECTED Final   Parainfluenza Virus 2 NOT DETECTED NOT DETECTED Final   Parainfluenza Virus 3 NOT DETECTED NOT DETECTED Final   Parainfluenza Virus 4 NOT DETECTED NOT DETECTED Final   Respiratory Syncytial Virus NOT DETECTED NOT DETECTED Final   Bordetella pertussis NOT DETECTED NOT DETECTED Final   Chlamydophila pneumoniae NOT DETECTED NOT DETECTED Final   Mycoplasma pneumoniae NOT DETECTED NOT DETECTED Final  MRSA PCR Screening     Status: None   Collection Time: 12/04/16  5:37 PM  Result Value Ref Range Status   MRSA by PCR NEGATIVE NEGATIVE Final    Comment:        The GeneXpert MRSA Assay (FDA approved for NASAL specimens only), is one component  of a comprehensive MRSA colonization surveillance program. It is not intended to diagnose MRSA infection nor to guide or monitor treatment for MRSA infections.   Culture, sputum-assessment     Status: None   Collection Time: 12/04/16  8:55 PM  Result Value Ref Range Status   Specimen Description EXPECTORATED SPUTUM  Final   Special Requests NONE  Final   Sputum evaluation THIS SPECIMEN IS ACCEPTABLE FOR SPUTUM CULTURE  Final   Report Status 12/05/2016 FINAL  Final  Culture, respiratory (NON-Expectorated)     Status: None (Preliminary result)   Collection Time: 12/04/16  8:55 PM  Result Value Ref Range Status   Specimen Description EXPECTORATED SPUTUM  Final   Special Requests NONE Reflexed from [id number]  Final   Gram Stain   Final    ABUNDANT WBC PRESENT,BOTH PMN AND MONONUCLEAR NO ORGANISMS SEEN    Culture PENDING  Incomplete   Report Status PENDING  Incomplete    Radiology Reports Dg Chest Port 1 View  Result Date: 12/05/2016 CLINICAL DATA:  Follow-up pneumonia EXAM: PORTABLE CHEST 1 VIEW COMPARISON:  12/03/2016 FINDINGS: Cardiac shadow is mildly enlarged but stable. The lungs are well aerated bilaterally. Persistent right basilar infiltrate is seen. Increasing left basilar infiltrate is noted as well. No other focal abnormality is seen. No bony abnormality is noted. IMPRESSION: Stable right basilar infiltrates. Increasing left basilar infiltrate. Electronically Signed   By: Inez Catalina M.D.   On: 12/05/2016 07:50   Dg Chest Port 1 View  Result Date: 12/03/2016 CLINICAL DATA:  Sudden onset shortness of breath yesterday. EXAM: PORTABLE CHEST 1 VIEW COMPARISON:  10/30/2016 FINDINGS: Consolidation noted in both lower lobes, right greater than left compatible with multifocal pneumonia. Suspect small right effusion. Heart is normal size. IMPRESSION: Bilateral lower lobe airspace consolidation, right greater than left compatible with multifocal pneumonia. Suspect small right effusion.  Electronically Signed   By: Rolm Baptise M.D.   On: 12/03/2016 10:57    Khamille Beynon M.D on 12/05/2016 at 12:17 PM  Between 7am to 7pm - Pager - [phone]  After 7pm go to [email or web address] - password [facility]  [facility] -  Office  [phone]

## 2016-12-06 LAB — BASIC METABOLIC PANEL
ANION GAP: 7 (ref 5–15)
BUN: 6 mg/dL (ref 6–20)
CO2: 25 mmol/L (ref 22–32)
Calcium: 8 mg/dL — ABNORMAL LOW (ref 8.9–10.3)
Chloride: 102 mmol/L (ref 101–111)
Creatinine, Ser: 0.75 mg/dL (ref 0.61–1.24)
GFR calc Af Amer: >60 mL/min (ref >60)
GFR calc non Af Amer: >60 mL/min (ref >60)
GLUCOSE: 95 mg/dL (ref 65–99)
POTASSIUM: 3.4 mmol/L — AB (ref 3.5–5.1)
Sodium: 134 mmol/L — ABNORMAL LOW (ref 135–145)

## 2016-12-06 LAB — CBC
HEMATOCRIT: 26.5 % — AB (ref 39.0–52.0)
HEMOGLOBIN: 8.6 g/dL — AB (ref 13.0–17.0)
MCH: 28.7 pg (ref 26.0–34.0)
MCHC: 32.5 g/dL (ref 30.0–36.0)
MCV: 88.3 fL (ref 78.0–100.0)
Platelets: 254 10*3/uL (ref 150–400)
RBC: 3 MIL/uL — AB (ref 4.22–5.81)
RDW: 14 % (ref 11.5–15.5)
WBC: 10 10*3/uL (ref 4.0–10.5)

## 2016-12-06 LAB — PHOSPHORUS: Phosphorus: 1.1 mg/dL — ABNORMAL LOW (ref 2.5–4.6)

## 2016-12-06 MED ORDER — POTASSIUM CHLORIDE CRYS ER 20 MEQ PO TBCR
40.0000 meq | EXTENDED_RELEASE_TABLET | Freq: Once | ORAL | Status: AC
  Administered 2016-12-06: 40 meq via ORAL
  Filled 2016-12-06: qty 2

## 2016-12-06 MED ORDER — AMLODIPINE BESYLATE 10 MG PO TABS
10.0000 mg | ORAL_TABLET | Freq: Every day | ORAL | Status: DC
  Administered 2016-12-06 – 2016-12-10 (×5): 10 mg via ORAL
  Filled 2016-12-06 (×6): qty 1

## 2016-12-06 MED ORDER — DEXTROSE 5 % IV SOLN
45.0000 mmol | Freq: Once | INTRAVENOUS | Status: AC
Start: 2016-12-06 — End: 2016-12-06
  Administered 2016-12-06: 45 mmol via INTRAVENOUS
  Filled 2016-12-06 (×2): qty 15

## 2016-12-06 NOTE — Progress Notes (Signed)
Patient is transferred to 5W 19 per wheelchair.  Message left with Mrs. Nellie Hoogland.  Patient is stable during this transfer.

## 2016-12-06 NOTE — Progress Notes (Addendum)
PROGRESS NOTE                                                                                                                                                                                                             Patient Demographics:    [name], is a 71 y.o. male, DOB - 08-12-1946, [id number]  Admit date - 12/03/2016   Admitting Physician Waldemar Dickens, MD  Outpatient Primary MD for the patient is Reginia Naas, MD  LOS - 3    Chief Complaint  Patient presents with  . Abdominal Pain       Brief Narrative   [name] is a 71 y.o. male with medical history significant of anxiety, depression, GERD, HTN, hypothyroidism, squamous cell carcinoma of the esophagus presenting with three-day history of right upper quadrant pain. Worse with deep inspiration. Associated with cough and shortness of breath and generalized malaise, work up significant for HCAP.   Subjective:    [name] today has, No headache, No chest pain,  Reports shortness of breath But is improving, cough, no further nausea or vomiting since admission.   Assessment  & Plan :    Active Problems:   CAP (community acquired pneumonia)   HCAP (healthcare-associated pneumonia)   Sepsis ([place])   Acute respiratory failure ([place])   History of cholecystectomy   Hypertension   Chronic pain   GERD (gastroesophageal reflux disease)   Sepsis secondary to HCAP - Febrile, tachycardic, tachypneic with elevated lactic acid and acute hypoxic respiratory failure  - Recent hospitalization , as well as nausea and vomiting , changed cefepime to Zosyn for possible aspiration .Start vancomycin 1/5 - Respiratory panel growing coronavirus, so most likely viral component to respiratory failure  - lactic acid normalized. - Initially hypotensive, held antihypertensive medication, improving with IV fluids   acute hypoxic respiratory failure  - Continue with oxygen, secondary to  above , improving, today in 2-3 L nasal cannula .  Cholecystectomy:  -Lap converted open. Patient is postop approximately 2-1/2 weeks. Evaluated by Dr.Byerly at time of admission. Unlikely abdominal complication from cholecystectomy at this point  HTN: - Blood pressure on the lower side on admission, currently improved, so resumed on amlodipine.  Chronic pain: - continue home oxy, but will change to when necessary  Hypophosphatemia/hypokalemia - Repleted  GERD: - continue PPI  Hypothyriod: - continue synthroid  Code Status : Full  Family Communication  : none at bedside   Disposition Plan  : Improving, will transfer  to medical floor  Consults  :  None  Procedures  : None  DVT Prophylaxis  :  Heparin - SCDs   Lab Results  Component Value Date   PLT 254 12/06/2016    Antibiotics  :    Anti-infectives    Start     Dose/Rate Route Frequency Ordered Stop   12/04/16 1800  vancomycin (VANCOCIN) IVPB 1000 mg/200 mL premix  Status:  Discontinued     1,000 mg 200 mL/hr over 60 Minutes Intravenous Every 12 hours 12/04/16 1417 12/05/16 1408   12/04/16 1400  piperacillin-tazobactam (ZOSYN) IVPB 3.375 g     3.375 g 12.5 mL/hr over 240 Minutes Intravenous Every 8 hours 12/04/16 0742     12/04/16 0500  vancomycin (VANCOCIN) IVPB 750 mg/150 ml premix  Status:  Discontinued     750 mg 150 mL/hr over 60 Minutes Intravenous Every 12 hours 12/03/16 1727 12/04/16 1417   12/03/16 1700  ceFEPIme (MAXIPIME) 1 g in dextrose 5 % 50 mL IVPB  Status:  Discontinued     1 g 100 mL/hr over 30 Minutes Intravenous Every 8 hours 12/03/16 1637 12/04/16 0713   12/03/16 1700  vancomycin (VANCOCIN) 1,250 mg in sodium chloride 0.9 % 250 mL IVPB     1,250 mg 166.7 mL/hr over 90 Minutes Intravenous  Once 12/03/16 1653 12/03/16 2010   12/03/16 1215  cefTRIAXone (ROCEPHIN) 1 g in dextrose 5 % 50 mL IVPB     1 g 100 mL/hr over 30 Minutes Intravenous  Once 12/03/16 1209 12/03/16 1330   12/03/16 1215   azithromycin (ZITHROMAX) 500 mg in dextrose 5 % 250 mL IVPB     500 mg 250 mL/hr over 60 Minutes Intravenous  Once 12/03/16 1209 12/03/16 1440        Objective:   Vitals:   12/06/16 0700 12/06/16 0800 12/06/16 0845 12/06/16 1000  BP: 127/81   (!) 142/87  Pulse: 73   86  Resp: 19   19  Temp: 98.4 F (36.9 C)     TempSrc: Oral Oral    SpO2: 92%  94% 92%  Weight:      Height:        Wt Readings from Last 3 Encounters:  12/03/16 83.9 kg (185 lb)  12/02/16 83.6 kg (184 lb 6.4 oz)  11/06/16 90.9 kg (200 lb 6.4 oz)     Intake/Output Summary (Last 24 hours) at 12/06/16 1127 Last data filed at 12/06/16 0936  Gross per 24 hour  Intake          4109.17 ml  Output             1150 ml  Net          2959.17 ml     Physical Exam  Awake Alert, Oriented X 3,  Supple Neck,No JVD, Symmetrical Chest wall movement,fair  air movement bilaterally, bibasilar rhonchi, no wheezing RRR,No Gallops,Rubs or new Murmurs, No Parasternal Heave +ve B.Sounds, Abd Soft,no  tenderness, No rebound - guarding or rigidity. No Cyanosis, Clubbing or edema, No new Rash or bruise      Data Review:    CBC  Recent Labs Lab 12/03/16 1114 12/03/16 1136 12/04/16 0500 12/05/16 0619 12/06/16 0334  WBC 7.5  --  11.0* 11.5* 10.0  HGB 10.9* 11.2* 9.3* 8.3* 8.6*  HCT 33.3* 33.0*  29.1* 25.2* 26.5*  PLT 269  --  221 217 254  MCV 89.0  --  88.7 88.1 88.3  MCH 29.1  --  28.4 29.0 28.7  MCHC 32.7  --  32.0 32.9 32.5  RDW 14.1  --  14.2 14.0 14.0    Chemistries   Recent Labs Lab 12/03/16 1114 12/03/16 1136 12/04/16 0500 12/05/16 0619 12/05/16 1223 12/06/16 0334  NA 135 133* 134* 134*  --  134*  K 3.9 4.5 3.4* 3.5  --  3.4*  CL 99* 96* 102 103  --  102  CO2 27  --  25 23  --  25  GLUCOSE 105* 103* 115* 118*  --  95  BUN 19 26* 19 11  --  6  CREATININE 1.43* 1.30* 1.01 0.75  --  0.75  CALCIUM 9.0  --  7.9* 7.8*  --  8.0*  MG  --   --   --   --  1.8  --   AST 44*  --   --   --   --   --     ALT 38  --   --   --   --   --   ALKPHOS 28*  --   --   --   --   --   BILITOT 1.0  --   --   --   --   --    ------------------------------------------------------------------------------------------------------------------ No results for input(s): CHOL, HDL, LDLCALC, TRIG, CHOLHDL, LDLDIRECT in the last 72 hours.  No results found for: HGBA1C ------------------------------------------------------------------------------------------------------------------ No results for input(s): TSH, T4TOTAL, T3FREE, THYROIDAB in the last 72 hours.  Invalid input(s): FREET3 ------------------------------------------------------------------------------------------------------------------ No results for input(s): VITAMINB12, FOLATE, FERRITIN, TIBC, IRON, RETICCTPCT in the last 72 hours.  Coagulation profile  Recent Labs Lab 12/03/16 1809  INR 1.24    No results for input(s): DDIMER in the last 72 hours.  Cardiac Enzymes  Recent Labs Lab 12/03/16 1809  TROPONINI <0.03   ------------------------------------------------------------------------------------------------------------------ No results found for: BNP  Inpatient Medications  Scheduled Meds: . DULoxetine  90 mg Oral Daily  . famotidine  20 mg Oral Daily  . heparin  5,000 Units Subcutaneous Q8H  . ipratropium-albuterol  3 mL Nebulization TID  . levothyroxine  150 mcg Oral QAC breakfast  . mouth rinse  15 mL Mouth Rinse BID  . mometasone-formoterol  2 puff Inhalation BID  . piperacillin-tazobactam (ZOSYN)  IV  3.375 g Intravenous Q8H  . potassium phosphate IVPB (mmol)  45 mmol Intravenous Once  . sodium chloride flush  3 mL Intravenous Q12H   Continuous Infusions: . sodium chloride 75 mL/hr at 12/06/16 0428   PRN Meds:.acetaminophen **OR** acetaminophen, chlorproMAZINE, fentaNYL (SUBLIMAZE) injection, oxyCODONE, promethazine, traZODone  Micro Results Recent Results (from the past 240 hour(s))  Culture, blood (routine x 2)      Status: None (Preliminary result)   Collection Time: 12/03/16 12:40 PM  Result Value Ref Range Status   Specimen Description BLOOD RIGHT ANTECUBITAL  Final   Special Requests BOTTLES DRAWN AEROBIC AND ANAEROBIC  5CC  Final   Culture NO GROWTH 2 DAYS  Final   Report Status PENDING  Incomplete  Culture, blood (routine x 2)     Status: None (Preliminary result)   Collection Time: 12/03/16 12:49 PM  Result Value Ref Range Status   Specimen Description BLOOD LEFT HAND  Final   Special Requests IN PEDIATRIC BOTTLE  2CC  Final   Culture NO GROWTH 2 DAYS  Final   Report Status PENDING  Incomplete  Urine culture     Status: Abnormal   Collection Time: 12/03/16  7:39 PM  Result Value Ref Range Status   Specimen Description URINE, CLEAN CATCH  Final   Special Requests NONE  Final   Culture MULTIPLE SPECIES PRESENT, SUGGEST RECOLLECTION (A)  Final   Report Status 12/04/2016 FINAL  Final  Respiratory Panel by PCR     Status: Abnormal   Collection Time: 12/04/16 12:04 AM  Result Value Ref Range Status   Adenovirus NOT DETECTED NOT DETECTED Final   Coronavirus 229E NOT DETECTED NOT DETECTED Final   Coronavirus HKU1 NOT DETECTED NOT DETECTED Final   Coronavirus NL63 DETECTED (A) NOT DETECTED Final   Coronavirus OC43 NOT DETECTED NOT DETECTED Final   Metapneumovirus NOT DETECTED NOT DETECTED Final   Rhinovirus / Enterovirus NOT DETECTED NOT DETECTED Final   Influenza A NOT DETECTED NOT DETECTED Final   Influenza B NOT DETECTED NOT DETECTED Final   Parainfluenza Virus 1 NOT DETECTED NOT DETECTED Final   Parainfluenza Virus 2 NOT DETECTED NOT DETECTED Final   Parainfluenza Virus 3 NOT DETECTED NOT DETECTED Final   Parainfluenza Virus 4 NOT DETECTED NOT DETECTED Final   Respiratory Syncytial Virus NOT DETECTED NOT DETECTED Final   Bordetella pertussis NOT DETECTED NOT DETECTED Final   Chlamydophila pneumoniae NOT DETECTED NOT DETECTED Final   Mycoplasma pneumoniae NOT DETECTED NOT DETECTED  Final  MRSA PCR Screening     Status: None   Collection Time: 12/04/16  5:37 PM  Result Value Ref Range Status   MRSA by PCR NEGATIVE NEGATIVE Final    Comment:        The GeneXpert MRSA Assay (FDA approved for NASAL specimens only), is one component of a comprehensive MRSA colonization surveillance program. It is not intended to diagnose MRSA infection nor to guide or monitor treatment for MRSA infections.   Culture, sputum-assessment     Status: None   Collection Time: 12/04/16  8:55 PM  Result Value Ref Range Status   Specimen Description EXPECTORATED SPUTUM  Final   Special Requests NONE  Final   Sputum evaluation THIS SPECIMEN IS ACCEPTABLE FOR SPUTUM CULTURE  Final   Report Status 12/05/2016 FINAL  Final  Culture, respiratory (NON-Expectorated)     Status: None (Preliminary result)   Collection Time: 12/04/16  8:55 PM  Result Value Ref Range Status   Specimen Description EXPECTORATED SPUTUM  Final   Special Requests NONE Reflexed from [id number]  Final   Gram Stain   Final    ABUNDANT WBC PRESENT,BOTH PMN AND MONONUCLEAR NO ORGANISMS SEEN    Culture PENDING  Incomplete   Report Status PENDING  Incomplete    Radiology Reports Dg Chest Port 1 View  Result Date: 12/05/2016 CLINICAL DATA:  Follow-up pneumonia EXAM: PORTABLE CHEST 1 VIEW COMPARISON:  12/03/2016 FINDINGS: Cardiac shadow is mildly enlarged but stable. The lungs are well aerated bilaterally. Persistent right basilar infiltrate is seen. Increasing left basilar infiltrate is noted as well. No other focal abnormality is seen. No bony abnormality is noted. IMPRESSION: Stable right basilar infiltrates. Increasing left basilar infiltrate. Electronically Signed   By: Inez Catalina M.D.   On: 12/05/2016 07:50   Dg Chest Port 1 View  Result Date: 12/03/2016 CLINICAL DATA:  Sudden onset shortness of breath yesterday. EXAM: PORTABLE CHEST 1 VIEW COMPARISON:  10/30/2016 FINDINGS: Consolidation noted in both lower lobes, right  greater than left compatible with multifocal pneumonia. Suspect small  right effusion. Heart is normal size. IMPRESSION: Bilateral lower lobe airspace consolidation, right greater than left compatible with multifocal pneumonia. Suspect small right effusion. Electronically Signed   By: Rolm Baptise M.D.   On: 12/03/2016 10:57    Kem Hensen M.D on 12/06/2016 at 11:27 AM  Between 7am to 7pm - Pager - [phone]  After 7pm go to [email or web address] - password [facility]  [facility] -  Office  [phone]

## 2016-12-06 NOTE — Evaluation (Signed)
Physical Therapy Evaluation Patient Details Name: [name] MRN: [id number] DOB: Nov 18, 1946 Today's Date: 12/06/2016   History of Present Illness  Pt is a 71 y/o male admitted secondary to abdominal pain, found to have HCAP/CAP and sepsis. 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  Clinical Impression  Pt presented supine in bed with HOB elevated, awake and willing to participate in therapy session. Prior to admission, pt reported that he was independent with all functional mobility and ADLs. Pt currently performing bed mobility with supervision, transfers with min guard and ambulation with min guard and no AD. Pt would continue to benefit from skilled physical therapy services at this time while admitted and after d/c to address his below listed limitations in order to improve his overall safety and independence with functional mobility.     Follow Up Recommendations Home health PT    Equipment Recommendations  None recommended by PT    Recommendations for Other Services       Precautions / Restrictions Restrictions Weight Bearing Restrictions: No      Mobility  Bed Mobility Overal bed mobility: Needs Assistance Bed Mobility: Supine to Sit     Supine to sit: Supervision;HOB elevated     General bed mobility comments: pt required increased time and effort, supervision for safety  Transfers Overall transfer level: Needs assistance Equipment used: None Transfers: Sit to/from Stand Sit to Stand: Min guard         General transfer comment: pt required increased time, no instability or LOB, min guard for safety  Ambulation/Gait Ambulation/Gait assistance: Min guard Ambulation Distance (Feet): 150 Feet Assistive device: None Gait Pattern/deviations: Step-through pattern;Decreased stride length;Trunk flexed Gait velocity: decreased Gait velocity interpretation: Below normal speed for age/gender General Gait Details:  pt ambulated on RA with SPO2 decreasing to 88% x2 but quickly recovering to >90% with standing rest break  Stairs            Wheelchair Mobility    Modified Rankin (Stroke Patients Only)       Balance Overall balance assessment: Needs assistance Sitting-balance support: Feet supported;No upper extremity supported Sitting balance-Leahy Scale: Good     Standing balance support: During functional activity;No upper extremity supported Standing balance-Leahy Scale: Fair                               Pertinent Vitals/Pain Pain Assessment: No/denies pain    Home Living Family/patient expects to be discharged to:: Private residence Living Arrangements: Spouse/significant other Available Help at Discharge: Family;Available 24 hours/day Type of Home: House Home Access: Stairs to enter Entrance Stairs-Rails: None Entrance Stairs-Number of Steps: 1 Home Layout: One level Home Equipment: Walker - 2 wheels;Bedside commode      Prior Function Level of Independence: Independent   Pt ambulated on RA with SPO2 decreasing to 88% x2 with quick recovery to >90% with standing rest break. All other VSS throughout.              Hand Dominance        Extremity/Trunk Assessment   Upper Extremity Assessment Upper Extremity Assessment: Overall WFL for tasks assessed    Lower Extremity Assessment Lower Extremity Assessment: Generalized weakness    Cervical / Trunk Assessment Cervical / Trunk Assessment: Kyphotic  Communication   Communication: No difficulties  Cognition Arousal/Alertness: Awake/alert Behavior During Therapy: WFL for tasks assessed/performed Overall Cognitive Status: Within Functional Limits for tasks  assessed                      General Comments      Exercises     Assessment/Plan    PT Assessment Patient needs continued PT services  PT Problem List Decreased strength;Decreased activity tolerance;Decreased balance;Decreased  mobility;Decreased coordination;Cardiopulmonary status limiting activity          PT Treatment Interventions DME instruction;Gait training;Stair training;Functional mobility training;Therapeutic activities;Therapeutic exercise;Balance training;Neuromuscular re-education;Patient/family education    PT Goals (Current goals can be found in the Care Plan section)  Acute Rehab PT Goals Patient Stated Goal: return home PT Goal Formulation: With patient Time For Goal Achievement: 12/20/16 Potential to Achieve Goals: Good    Frequency Min 3X/week   Barriers to discharge        Co-evaluation               End of Session Equipment Utilized During Treatment: Gait belt Activity Tolerance: Patient limited by fatigue Patient left: in chair;with call bell/phone within reach Nurse Communication: Mobility status         Time: [id number] PT Time Calculation (min) (ACUTE ONLY): 20 min   Charges:   PT Evaluation $PT Eval Low Complexity: 1 Procedure     PT G CodesClearnce Sorrel Alexius [name] 12/06/2016, 10:46 AM [name], PT, DPT [phone]

## 2016-12-07 LAB — CULTURE, RESPIRATORY W GRAM STAIN: Culture: NORMAL

## 2016-12-07 LAB — BASIC METABOLIC PANEL
Anion gap: 10 (ref 5–15)
CHLORIDE: 99 mmol/L — AB (ref 101–111)
CO2: 24 mmol/L (ref 22–32)
CREATININE: 0.71 mg/dL (ref 0.61–1.24)
Calcium: 8.2 mg/dL — ABNORMAL LOW (ref 8.9–10.3)
GFR calc Af Amer: >60 mL/min (ref >60)
GLUCOSE: 94 mg/dL (ref 65–99)
POTASSIUM: 3.4 mmol/L — AB (ref 3.5–5.1)
Sodium: 133 mmol/L — ABNORMAL LOW (ref 135–145)

## 2016-12-07 LAB — CBC
HCT: 28 % — ABNORMAL LOW (ref 39.0–52.0)
Hemoglobin: 9.1 g/dL — ABNORMAL LOW (ref 13.0–17.0)
MCH: 28.5 pg (ref 26.0–34.0)
MCHC: 32.5 g/dL (ref 30.0–36.0)
MCV: 87.8 fL (ref 78.0–100.0)
PLATELETS: 285 10*3/uL (ref 150–400)
RBC: 3.19 MIL/uL — AB (ref 4.22–5.81)
RDW: 14.1 % (ref 11.5–15.5)
WBC: 12.1 10*3/uL — ABNORMAL HIGH (ref 4.0–10.5)

## 2016-12-07 LAB — CULTURE, RESPIRATORY

## 2016-12-07 MED ORDER — POTASSIUM CHLORIDE CRYS ER 20 MEQ PO TBCR
40.0000 meq | EXTENDED_RELEASE_TABLET | Freq: Once | ORAL | Status: AC
  Administered 2016-12-07: 40 meq via ORAL
  Filled 2016-12-07: qty 2

## 2016-12-07 MED ORDER — K PHOS MONO-SOD PHOS DI & MONO 155-852-130 MG PO TABS
500.0000 mg | ORAL_TABLET | Freq: Three times a day (TID) | ORAL | Status: AC
  Administered 2016-12-07 – 2016-12-08 (×6): 500 mg via ORAL
  Filled 2016-12-07 (×6): qty 2

## 2016-12-07 MED ORDER — BISACODYL 10 MG RE SUPP
10.0000 mg | Freq: Once | RECTAL | Status: AC
  Administered 2016-12-07: 10 mg via RECTAL
  Filled 2016-12-07: qty 1

## 2016-12-07 NOTE — Progress Notes (Signed)
PROGRESS NOTE                                                                                                                                                                                                             Patient Demographics:    [name], is a 71 y.o. male, DOB - 1946/06/18, [id number]  Admit date - 12/03/2016   Admitting Physician Waldemar Dickens, MD  Outpatient Primary MD for the patient is Reginia Naas, MD  LOS - 4    Chief Complaint  Patient presents with  . Abdominal Pain       Brief Narrative   [name] is a 71 y.o. male with medical history significant of anxiety, depression, GERD, HTN, hypothyroidism, squamous cell carcinoma of the esophagus presenting with three-day history of right upper quadrant pain. Worse with deep inspiration. Associated with cough and shortness of breath and generalized malaise, work up significant for HCAP.   Subjective:    [name] today has, No headache, No chest pain,  Reports shortness of breath But is improving, cough, no further nausea or vomiting since admission.   Assessment  & Plan :    Active Problems:   CAP (community acquired pneumonia)   HCAP (healthcare-associated pneumonia)   Sepsis ([place])   Acute respiratory failure ([place])   History of cholecystectomy   Hypertension   Chronic pain   GERD (gastroesophageal reflux disease)   Sepsis secondary to HCAP - Febrile, tachycardic, tachypneic with elevated lactic acid and acute hypoxic respiratory failure  - Recent hospitalization , as well as nausea and vomiting , changed cefepime to Zosyn for possible aspiration .Started vancomycin 1/5, Stopped 1/7. - Respiratory panel growing coronavirus, so most likely viral component to respiratory failure  - lactic acid normalized. - Initially hypotensive, held antihypertensive medication, improving with IV fluids   acute hypoxic respiratory failure  - Continue with  oxygen, secondary to above , resolved, currently on room air  Cholecystectomy:  -Lap converted open. Patient is postop approximately 2-1/2 weeks. Evaluated by Dr.Byerly at time of admission. Unlikely abdominal complication from cholecystectomy at this point  HTN: - Blood pressure on the lower side on admission, currently improved, so resumed on amlodipine.  Chronic pain: - continue home oxy, but will change to when necessary  Hypophosphatemia/hypokalemia - Repleted  GERD: - continue PPI  Hypothyriod: - continue synthroid  Code Status : Full  Family Communication  : none at bedside   Disposition Plan  : Home in 1-2 days  Consults  :  None  Procedures  : None  DVT Prophylaxis  :  Heparin - SCDs   Lab Results  Component Value Date   PLT 285 12/07/2016    Antibiotics  :    Anti-infectives    Start     Dose/Rate Route Frequency Ordered Stop   12/04/16 1800  vancomycin (VANCOCIN) IVPB 1000 mg/200 mL premix  Status:  Discontinued     1,000 mg 200 mL/hr over 60 Minutes Intravenous Every 12 hours 12/04/16 1417 12/05/16 1408   12/04/16 1400  piperacillin-tazobactam (ZOSYN) IVPB 3.375 g     3.375 g 12.5 mL/hr over 240 Minutes Intravenous Every 8 hours 12/04/16 0742     12/04/16 0500  vancomycin (VANCOCIN) IVPB 750 mg/150 ml premix  Status:  Discontinued     750 mg 150 mL/hr over 60 Minutes Intravenous Every 12 hours 12/03/16 1727 12/04/16 1417   12/03/16 1700  ceFEPIme (MAXIPIME) 1 g in dextrose 5 % 50 mL IVPB  Status:  Discontinued     1 g 100 mL/hr over 30 Minutes Intravenous Every 8 hours 12/03/16 1637 12/04/16 0713   12/03/16 1700  vancomycin (VANCOCIN) 1,250 mg in sodium chloride 0.9 % 250 mL IVPB     1,250 mg 166.7 mL/hr over 90 Minutes Intravenous  Once 12/03/16 1653 12/03/16 2010   12/03/16 1215  cefTRIAXone (ROCEPHIN) 1 g in dextrose 5 % 50 mL IVPB     1 g 100 mL/hr over 30 Minutes Intravenous  Once 12/03/16 1209 12/03/16 1330   12/03/16 1215  azithromycin  (ZITHROMAX) 500 mg in dextrose 5 % 250 mL IVPB     500 mg 250 mL/hr over 60 Minutes Intravenous  Once 12/03/16 1209 12/03/16 1440        Objective:   Vitals:   12/06/16 2109 12/06/16 2120 12/07/16 0512 12/07/16 1017  BP: (!) 144/81  135/87   Pulse: 93 94 85   Resp: 18 18 18    Temp: 98.2 F (36.8 C)  98.2 F (36.8 C)   TempSrc: Oral  Oral   SpO2: 93% 94% 91% (!) 88%  Weight:      Height:        Wt Readings from Last 3 Encounters:  12/03/16 83.9 kg (185 lb)  12/02/16 83.6 kg (184 lb 6.4 oz)  11/06/16 90.9 kg (200 lb 6.4 oz)     Intake/Output Summary (Last 24 hours) at 12/07/16 1345 Last data filed at 12/07/16 0500  Gross per 24 hour  Intake             1155 ml  Output             1350 ml  Net             -195 ml     Physical Exam  Awake Alert, Oriented X 3,  Supple Neck,No JVD, Symmetrical Chest wall movement,fair  air movement bilaterally, bibasilar rhonchi, no wheezing RRR,No Gallops,Rubs or new Murmurs, No Parasternal Heave +ve B.Sounds, Abd Soft,no  tenderness, No rebound - guarding or rigidity. No Cyanosis, Clubbing or edema, No new Rash or bruise      Data Review:    CBC  Recent Labs Lab 12/03/16 1114 12/03/16 1136 12/04/16 0500 12/05/16 0619 12/06/16 0334 12/07/16 0519  WBC 7.5  --  11.0* 11.5* 10.0 12.1*  HGB 10.9* 11.2* 9.3* 8.3* 8.6* 9.1*  HCT 33.3* 33.0* 29.1* 25.2* 26.5* 28.0*  PLT 269  --  221 217 254 285  MCV 89.0  --  88.7 88.1 88.3 87.8  MCH 29.1  --  28.4 29.0 28.7 28.5  MCHC 32.7  --  32.0 32.9 32.5 32.5  RDW 14.1  --  14.2 14.0 14.0 14.1    Chemistries   Recent Labs Lab 12/03/16 1114 12/03/16 1136 12/04/16 0500 12/05/16 0619 12/05/16 1223 12/06/16 0334 12/07/16 0519  NA 135 133* 134* 134*  --  134* 133*  K 3.9 4.5 3.4* 3.5  --  3.4* 3.4*  CL 99* 96* 102 103  --  102 99*  CO2 27  --  25 23  --  25 24  GLUCOSE 105* 103* 115* 118*  --  95 94  BUN 19 26* 19 11  --  6 <5*  CREATININE 1.43* 1.30* 1.01 0.75  --  0.75  0.71  CALCIUM 9.0  --  7.9* 7.8*  --  8.0* 8.2*  MG  --   --   --   --  1.8  --   --   AST 44*  --   --   --   --   --   --   ALT 38  --   --   --   --   --   --   ALKPHOS 28*  --   --   --   --   --   --   BILITOT 1.0  --   --   --   --   --   --    ------------------------------------------------------------------------------------------------------------------ No results for input(s): CHOL, HDL, LDLCALC, TRIG, CHOLHDL, LDLDIRECT in the last 72 hours.  No results found for: HGBA1C ------------------------------------------------------------------------------------------------------------------ No results for input(s): TSH, T4TOTAL, T3FREE, THYROIDAB in the last 72 hours.  Invalid input(s): FREET3 ------------------------------------------------------------------------------------------------------------------ No results for input(s): VITAMINB12, FOLATE, FERRITIN, TIBC, IRON, RETICCTPCT in the last 72 hours.  Coagulation profile  Recent Labs Lab 12/03/16 1809  INR 1.24    No results for input(s): DDIMER in the last 72 hours.  Cardiac Enzymes  Recent Labs Lab 12/03/16 1809  TROPONINI <0.03   ------------------------------------------------------------------------------------------------------------------ No results found for: BNP  Inpatient Medications  Scheduled Meds: . amLODipine  10 mg Oral Daily  . DULoxetine  90 mg Oral Daily  . famotidine  20 mg Oral Daily  . heparin  5,000 Units Subcutaneous Q8H  . ipratropium-albuterol  3 mL Nebulization TID  . levothyroxine  150 mcg Oral QAC breakfast  . mouth rinse  15 mL Mouth Rinse BID  . mometasone-formoterol  2 puff Inhalation BID  . phosphorus  500 mg Oral TID  . piperacillin-tazobactam (ZOSYN)  IV  3.375 g Intravenous Q8H  . potassium chloride  40 mEq Oral Once  . sodium chloride flush  3 mL Intravenous Q12H   Continuous Infusions: . sodium chloride 75 mL/hr at 12/06/16 0428   PRN Meds:.acetaminophen **OR**  acetaminophen, chlorproMAZINE, fentaNYL (SUBLIMAZE) injection, oxyCODONE, promethazine, traZODone  Micro Results Recent Results (from the past 240 hour(s))  Culture, blood (routine x 2)     Status: None (Preliminary result)   Collection Time: 12/03/16 12:40 PM  Result Value Ref Range Status   Specimen Description BLOOD RIGHT ANTECUBITAL  Final   Special Requests BOTTLES DRAWN AEROBIC AND ANAEROBIC  5CC  Final   Culture NO GROWTH 3 DAYS  Final   Report Status PENDING  Incomplete  Culture, blood (routine x 2)     Status: None (Preliminary result)   Collection Time: 12/03/16 12:49 PM  Result Value Ref Range Status   Specimen Description BLOOD LEFT HAND  Final   Special Requests IN PEDIATRIC BOTTLE  2CC  Final   Culture NO GROWTH 3 DAYS  Final   Report Status PENDING  Incomplete  Urine culture     Status: Abnormal   Collection Time: 12/03/16  7:39 PM  Result Value Ref Range Status   Specimen Description URINE, CLEAN CATCH  Final   Special Requests NONE  Final   Culture MULTIPLE SPECIES PRESENT, SUGGEST RECOLLECTION (A)  Final   Report Status 12/04/2016 FINAL  Final  Respiratory Panel by PCR     Status: Abnormal   Collection Time: 12/04/16 12:04 AM  Result Value Ref Range Status   Adenovirus NOT DETECTED NOT DETECTED Final   Coronavirus 229E NOT DETECTED NOT DETECTED Final   Coronavirus HKU1 NOT DETECTED NOT DETECTED Final   Coronavirus NL63 DETECTED (A) NOT DETECTED Final   Coronavirus OC43 NOT DETECTED NOT DETECTED Final   Metapneumovirus NOT DETECTED NOT DETECTED Final   Rhinovirus / Enterovirus NOT DETECTED NOT DETECTED Final   Influenza A NOT DETECTED NOT DETECTED Final   Influenza B NOT DETECTED NOT DETECTED Final   Parainfluenza Virus 1 NOT DETECTED NOT DETECTED Final   Parainfluenza Virus 2 NOT DETECTED NOT DETECTED Final   Parainfluenza Virus 3 NOT DETECTED NOT DETECTED Final   Parainfluenza Virus 4 NOT DETECTED NOT DETECTED Final   Respiratory Syncytial Virus NOT  DETECTED NOT DETECTED Final   Bordetella pertussis NOT DETECTED NOT DETECTED Final   Chlamydophila pneumoniae NOT DETECTED NOT DETECTED Final   Mycoplasma pneumoniae NOT DETECTED NOT DETECTED Final  MRSA PCR Screening     Status: None   Collection Time: 12/04/16  5:37 PM  Result Value Ref Range Status   MRSA by PCR NEGATIVE NEGATIVE Final    Comment:        The GeneXpert MRSA Assay (FDA approved for NASAL specimens only), is one component of a comprehensive MRSA colonization surveillance program. It is not intended to diagnose MRSA infection nor to guide or monitor treatment for MRSA infections.   Culture, sputum-assessment     Status: None   Collection Time: 12/04/16  8:55 PM  Result Value Ref Range Status   Specimen Description EXPECTORATED SPUTUM  Final   Special Requests NONE  Final   Sputum evaluation THIS SPECIMEN IS ACCEPTABLE FOR SPUTUM CULTURE  Final   Report Status 12/05/2016 FINAL  Final  Culture, respiratory (NON-Expectorated)     Status: None   Collection Time: 12/04/16  8:55 PM  Result Value Ref Range Status   Specimen Description EXPECTORATED SPUTUM  Final   Special Requests NONE Reflexed from [id number]  Final   Gram Stain   Final    ABUNDANT WBC PRESENT,BOTH PMN AND MONONUCLEAR NO ORGANISMS SEEN    Culture Consistent with normal respiratory flora.  Final   Report Status 12/07/2016 FINAL  Final    Radiology Reports Dg Chest Port 1 View  Result Date: 12/05/2016 CLINICAL DATA:  Follow-up pneumonia EXAM: PORTABLE CHEST 1 VIEW COMPARISON:  12/03/2016 FINDINGS: Cardiac shadow is mildly enlarged but stable. The lungs are well aerated bilaterally. Persistent right basilar infiltrate is seen. Increasing left basilar infiltrate is noted as well. No other focal abnormality is seen. No bony abnormality is noted. IMPRESSION: Stable right basilar infiltrates. Increasing left basilar infiltrate. Electronically Signed  By: Inez Catalina M.D.   On: 12/05/2016 07:50   Dg Chest  Port 1 View  Result Date: 12/03/2016 CLINICAL DATA:  Sudden onset shortness of breath yesterday. EXAM: PORTABLE CHEST 1 VIEW COMPARISON:  10/30/2016 FINDINGS: Consolidation noted in both lower lobes, right greater than left compatible with multifocal pneumonia. Suspect small right effusion. Heart is normal size. IMPRESSION: Bilateral lower lobe airspace consolidation, right greater than left compatible with multifocal pneumonia. Suspect small right effusion. Electronically Signed   By: Rolm Baptise M.D.   On: 12/03/2016 10:57    Alecsander Hattabaugh M.D on 12/07/2016 at 1:45 PM  Between 7am to 7pm - Pager - [phone]  After 7pm go to [email or web address] - password [facility]  [facility] -  Office  [phone]

## 2016-12-07 NOTE — Progress Notes (Signed)
Pharmacy Antibiotic Note  [name] is a 71 y.o. male admitted who continues on Zosyn for aspiration PNA coverage. Renal function remains stable, SCr 0.71, CrCl~80-100 ml/min. Dose remains appropriate.   Plan: 1. Continue Zosyn 3.375 g IV every 8 hours 2. Pharmacy will sign off as no further dose adjustments are expected at this time  Height: 6\' 3"  (190.5 cm) Weight: 185 lb (83.9 kg) IBW/kg (Calculated) : 84.5  Temp (24hrs), Avg:98.3 F (36.8 C), Min:98.2 F (36.8 C), Max:98.6 F (37 C)   Recent Labs Lab 12/03/16 1114 12/03/16 1128 12/03/16 1136 12/03/16 1437 12/03/16 2022 12/03/16 2233 12/04/16 0500 12/04/16 1449 12/05/16 0619 12/06/16 0334 12/07/16 0519  WBC 7.5  --   --   --   --   --  11.0*  --  11.5* 10.0 12.1*  CREATININE 1.43*  --  1.30*  --   --   --  1.01  --  0.75 0.75 0.71  LATICACIDVEN  --  2.13*  --  3.00* 1.8 2.2*  --  1.7  --   --   --     Estimated Creatinine Clearance: 99 mL/min (by C-G formula based on SCr of 0.71 mg/dL).    Allergies  Allergen Reactions  . Doxycycline Nausea And Vomiting  . Guaifenesin Nausea And Vomiting  . Morphine Sulfate Other (See Comments)  . Tapentadol Other (See Comments)   Thank you for allowing pharmacy to be a part of this patient's care.  Alycia Rossetti, PharmD, BCPS Clinical Pharmacist Pager: [phone] 12/07/2016 1:45 PM

## 2016-12-08 ENCOUNTER — Inpatient Hospital Stay (HOSPITAL_COMMUNITY): Payer: Medicare Other

## 2016-12-08 ENCOUNTER — Encounter (HOSPITAL_COMMUNITY): Payer: Self-pay | Admitting: Radiology

## 2016-12-08 LAB — CULTURE, BLOOD (ROUTINE X 2)
Culture: NO GROWTH
Culture: NO GROWTH

## 2016-12-08 LAB — PROCALCITONIN: Procalcitonin: 3.05 ng/mL

## 2016-12-08 LAB — CBC
HEMATOCRIT: 27.7 % — AB (ref 39.0–52.0)
Hemoglobin: 8.9 g/dL — ABNORMAL LOW (ref 13.0–17.0)
MCH: 28.1 pg (ref 26.0–34.0)
MCHC: 32.1 g/dL (ref 30.0–36.0)
MCV: 87.4 fL (ref 78.0–100.0)
Platelets: 314 10*3/uL (ref 150–400)
RBC: 3.17 MIL/uL — ABNORMAL LOW (ref 4.22–5.81)
RDW: 14.2 % (ref 11.5–15.5)
WBC: 11 10*3/uL — AB (ref 4.0–10.5)

## 2016-12-08 LAB — BASIC METABOLIC PANEL
ANION GAP: 9 (ref 5–15)
BUN: <5 mg/dL — ABNORMAL LOW (ref 6–20)
CALCIUM: 7.8 mg/dL — AB (ref 8.9–10.3)
CO2: 26 mmol/L (ref 22–32)
Chloride: 101 mmol/L (ref 101–111)
Creatinine, Ser: 0.77 mg/dL (ref 0.61–1.24)
GFR calc Af Amer: >60 mL/min (ref >60)
GFR calc non Af Amer: >60 mL/min (ref >60)
GLUCOSE: 98 mg/dL (ref 65–99)
POTASSIUM: 3.2 mmol/L — AB (ref 3.5–5.1)
Sodium: 136 mmol/L (ref 135–145)

## 2016-12-08 LAB — BLOOD GAS, ARTERIAL
Acid-Base Excess: 1.6 mmol/L (ref 0.0–2.0)
Bicarbonate: 25.8 mmol/L (ref 20.0–28.0)
DRAWN BY: 441371
O2 Content: 4 L/min
O2 Saturation: 94.8 %
PATIENT TEMPERATURE: 99.3
pCO2 arterial: 42.6 mmHg (ref 32.0–48.0)
pH, Arterial: 7.403 (ref 7.350–7.450)
pO2, Arterial: 73.2 mmHg — ABNORMAL LOW (ref 83.0–108.0)

## 2016-12-08 MED ORDER — ENSURE ENLIVE PO LIQD
237.0000 mL | Freq: Two times a day (BID) | ORAL | Status: DC
  Administered 2016-12-08 – 2016-12-10 (×5): 237 mL via ORAL

## 2016-12-08 MED ORDER — POTASSIUM CHLORIDE CRYS ER 20 MEQ PO TBCR
40.0000 meq | EXTENDED_RELEASE_TABLET | Freq: Two times a day (BID) | ORAL | Status: AC
Start: 2016-12-08 — End: 2016-12-08
  Administered 2016-12-08 (×2): 40 meq via ORAL
  Filled 2016-12-08 (×2): qty 2

## 2016-12-08 MED ORDER — VANCOMYCIN HCL IN DEXTROSE 1-5 GM/200ML-% IV SOLN
1000.0000 mg | Freq: Two times a day (BID) | INTRAVENOUS | Status: DC
  Administered 2016-12-08 – 2016-12-10 (×4): 1000 mg via INTRAVENOUS
  Filled 2016-12-08 (×6): qty 200

## 2016-12-08 MED ORDER — IOPAMIDOL (ISOVUE-370) INJECTION 76%
INTRAVENOUS | Status: AC
  Administered 2016-12-08: 100 mL
  Filled 2016-12-08: qty 100

## 2016-12-08 NOTE — Progress Notes (Signed)
Pharmacy Antibiotic Note  [name] is a 71 y.o. male admitted who continues on Zosyn for aspiration PNA coverage. Added IV Vancomycin per pharmacy no specific indication given.  Currently on Zosyn for sepsis secondary to HCAP/ aspiration PNA + resp virus Coronavirus. Tc 98.1, Tm 101 last night.  WBC 11, SCr 0.77 stable, CrCl 79 ml/min (using SCr 1.0 due to age)     Plan: Start Vancomycin 1000 mg IV q12h for VT 15-20 Monitor Vancomycin trough at steady state.  Continue Zosyn 3.375 g IV every 8 hours   Height: 6\' 3"  (190.5 cm) Weight: 185 lb (83.9 kg) IBW/kg (Calculated) : 84.5  Temp (24hrs), Avg:99.1 F (37.3 C), Min:98.1 F (36.7 C), Max:101 F (38.3 C)   Recent Labs Lab 12/03/16 1128  12/03/16 1437 12/03/16 2022 12/03/16 2233 12/04/16 0500 12/04/16 1449 12/05/16 0619 12/06/16 0334 12/07/16 0519 12/08/16 0835  WBC  --   --   --   --   --  11.0*  --  11.5* 10.0 12.1* 11.0*  CREATININE  --   < >  --   --   --  1.01  --  0.75 0.75 0.71 0.77  LATICACIDVEN 2.13*  --  3.00* 1.8 2.2*  --  1.7  --   --   --   --   < > = values in this interval not displayed.  Estimated Creatinine Clearance: 99 mL/min (by C-G formula based on SCr of 0.77 mg/dL).    Allergies  Allergen Reactions  . Doxycycline Nausea And Vomiting  . Guaifenesin Nausea And Vomiting  . Morphine Sulfate Other (See Comments)  . Tapentadol Other (See Comments)   Thank you for allowing pharmacy to be a part of this patient's care.  Nicole Cella, RPh Clinical Pharmacist Pager: [phone] 12/08/2016 4:59 PM

## 2016-12-08 NOTE — Progress Notes (Signed)
RT explained to patient that there were new orders for CPT to do precussors. Pt stated he would like to try FLutter again because he has no problem doing it and coughing. It just hurts him to cough. RT explained idea of coughing with pillow to help and pt states he has already tried and it still hurts to cough. Pt has strong cough but not able to cough up anything at this time. Pt had excellent effort,

## 2016-12-08 NOTE — Progress Notes (Signed)
PROGRESS NOTE                                                                                                                                                                                                             Patient Demographics:    [name], is a 71 y.o. male, DOB - 11-Jun-1946, [id number]  Admit date - 12/03/2016   Admitting Physician Waldemar Dickens, MD  Outpatient Primary MD for the patient is Reginia Naas, MD  LOS - 5    Chief Complaint  Patient presents with  . Abdominal Pain       Brief Narrative   [name] is a 71 y.o. male with medical history significant of anxiety, depression, GERD, HTN, hypothyroidism, squamous cell carcinoma of the esophagus presenting with three-day history of right upper quadrant pain. Worse with deep inspiration. Associated with cough and shortness of breath and generalized malaise, work up significant for HCAP.   Subjective:    [name] today has, No headache, No chest pain,  Reports shortness of breath  is improving, cough, no further nausea or vomiting since admission. He had fever 101 overnight, hypoxic 83-85% on room air, started back on 4 L nasal cannula.   Assessment  & Plan :    Active Problems:   CAP (community acquired pneumonia)   HCAP (healthcare-associated pneumonia)   Sepsis ([place])   Acute respiratory failure ([place])   History of cholecystectomy   Hypertension   Chronic pain   GERD (gastroesophageal reflux disease)   Sepsis secondary to HCAP - Febrile, tachycardic, tachypneic with elevated lactic acid and acute hypoxic respiratory failure  - Recent hospitalization , as well as nausea and vomiting , changed cefepime to Zosyn for possible aspiration .Started vancomycin 1/5, Stopped 1/7. - Respiratory panel growing coronavirus, so most likely viral component to respiratory failure  - lactic acid normalized. - Initially hypotensive, held antihypertensive  medication, improving with IV fluids - Agent with fever 101 overnight, repeat chest x-ray showing no change in pneumonia, will check pro-calcitonin.   acute hypoxic respiratory failure  - Continue with oxygen, secondary to above , Had no oxygen requirement yesterday, but overnight was hypoxic, back on 4 L nasal cannula, continue with nebs, flutter valve and pulmonary toilet.  Cholecystectomy:  -Lap converted open. Patient is postop approximately 2-1/2 weeks. Evaluated by Dr.Byerly  at time of admission. Unlikely abdominal complication from cholecystectomy at this point  HTN: - Blood pressure on the lower side on admission, currently improved, so resumed on amlodipine.  Chronic pain: - continue home oxy, but will change to when necessary  Hypophosphatemia/hypokalemia - Repleted  GERD: - continue PPI  Hypothyriod: - continue synthroid  Code Status : Full  Family Communication  : none at bedside , left wife a voicemail  Disposition Plan  : Home in 1-2 days  Consults  :  None  Procedures  : None  DVT Prophylaxis  :  Heparin - SCDs   Lab Results  Component Value Date   PLT 314 12/08/2016    Antibiotics  :    Anti-infectives    Start     Dose/Rate Route Frequency Ordered Stop   12/04/16 1800  vancomycin (VANCOCIN) IVPB 1000 mg/200 mL premix  Status:  Discontinued     1,000 mg 200 mL/hr over 60 Minutes Intravenous Every 12 hours 12/04/16 1417 12/05/16 1408   12/04/16 1400  piperacillin-tazobactam (ZOSYN) IVPB 3.375 g     3.375 g 12.5 mL/hr over 240 Minutes Intravenous Every 8 hours 12/04/16 0742     12/04/16 0500  vancomycin (VANCOCIN) IVPB 750 mg/150 ml premix  Status:  Discontinued     750 mg 150 mL/hr over 60 Minutes Intravenous Every 12 hours 12/03/16 1727 12/04/16 1417   12/03/16 1700  ceFEPIme (MAXIPIME) 1 g in dextrose 5 % 50 mL IVPB  Status:  Discontinued     1 g 100 mL/hr over 30 Minutes Intravenous Every 8 hours 12/03/16 1637 12/04/16 0713   12/03/16 1700   vancomycin (VANCOCIN) 1,250 mg in sodium chloride 0.9 % 250 mL IVPB     1,250 mg 166.7 mL/hr over 90 Minutes Intravenous  Once 12/03/16 1653 12/03/16 2010   12/03/16 1215  cefTRIAXone (ROCEPHIN) 1 g in dextrose 5 % 50 mL IVPB     1 g 100 mL/hr over 30 Minutes Intravenous  Once 12/03/16 1209 12/03/16 1330   12/03/16 1215  azithromycin (ZITHROMAX) 500 mg in dextrose 5 % 250 mL IVPB     500 mg 250 mL/hr over 60 Minutes Intravenous  Once 12/03/16 1209 12/03/16 1440        Objective:   Vitals:   12/08/16 0753 12/08/16 0808 12/08/16 1030 12/08/16 1123  BP:      Pulse: 88 94  97  Resp: 18 20  18   Temp:   99 F (37.2 C)   TempSrc:   Oral   SpO2: (!) 85% 91% 95% 92%  Weight:      Height:        Wt Readings from Last 3 Encounters:  12/03/16 83.9 kg (185 lb)  12/02/16 83.6 kg (184 lb 6.4 oz)  11/06/16 90.9 kg (200 lb 6.4 oz)     Intake/Output Summary (Last 24 hours) at 12/08/16 1301 Last data filed at 12/08/16 1108  Gross per 24 hour  Intake          1383.75 ml  Output             1625 ml  Net          -241.25 ml     Physical Exam  Awake Alert, Oriented X 3,  Supple Neck,No JVD, Symmetrical Chest wall movement,fair  air movement bilaterally, bibasilar rhonchi, no wheezing RRR,No Gallops,Rubs or new Murmurs, No Parasternal Heave +ve B.Sounds, Abd Soft,no  tenderness, No rebound - guarding or rigidity. No Cyanosis,  Clubbing or edema, No new Rash or bruise      Data Review:    CBC  Recent Labs Lab 12/04/16 0500 12/05/16 0619 12/06/16 0334 12/07/16 0519 12/08/16 0835  WBC 11.0* 11.5* 10.0 12.1* 11.0*  HGB 9.3* 8.3* 8.6* 9.1* 8.9*  HCT 29.1* 25.2* 26.5* 28.0* 27.7*  PLT 221 217 254 285 314  MCV 88.7 88.1 88.3 87.8 87.4  MCH 28.4 29.0 28.7 28.5 28.1  MCHC 32.0 32.9 32.5 32.5 32.1  RDW 14.2 14.0 14.0 14.1 14.2    Chemistries   Recent Labs Lab 12/03/16 1114  12/04/16 0500 12/05/16 0619 12/05/16 1223 12/06/16 0334 12/07/16 0519 12/08/16 0835  NA 135   < > 134* 134*  --  134* 133* 136  K 3.9  < > 3.4* 3.5  --  3.4* 3.4* 3.2*  CL 99*  < > 102 103  --  102 99* 101  CO2 27  --  25 23  --  25 24 26   GLUCOSE 105*  < > 115* 118*  --  95 94 98  BUN 19  < > 19 11  --  6 <5* <5*  CREATININE 1.43*  < > 1.01 0.75  --  0.75 0.71 0.77  CALCIUM 9.0  --  7.9* 7.8*  --  8.0* 8.2* 7.8*  MG  --   --   --   --  1.8  --   --   --   AST 44*  --   --   --   --   --   --   --   ALT 38  --   --   --   --   --   --   --   ALKPHOS 28*  --   --   --   --   --   --   --   BILITOT 1.0  --   --   --   --   --   --   --   < > = values in this interval not displayed. ------------------------------------------------------------------------------------------------------------------ No results for input(s): CHOL, HDL, LDLCALC, TRIG, CHOLHDL, LDLDIRECT in the last 72 hours.  No results found for: HGBA1C ------------------------------------------------------------------------------------------------------------------ No results for input(s): TSH, T4TOTAL, T3FREE, THYROIDAB in the last 72 hours.  Invalid input(s): FREET3 ------------------------------------------------------------------------------------------------------------------ No results for input(s): VITAMINB12, FOLATE, FERRITIN, TIBC, IRON, RETICCTPCT in the last 72 hours.  Coagulation profile  Recent Labs Lab 12/03/16 1809  INR 1.24    No results for input(s): DDIMER in the last 72 hours.  Cardiac Enzymes  Recent Labs Lab 12/03/16 1809  TROPONINI <0.03   ------------------------------------------------------------------------------------------------------------------ No results found for: BNP  Inpatient Medications  Scheduled Meds: . amLODipine  10 mg Oral Daily  . DULoxetine  90 mg Oral Daily  . famotidine  20 mg Oral Daily  . feeding supplement (ENSURE ENLIVE)  237 mL Oral BID BM  . heparin  5,000 Units Subcutaneous Q8H  . ipratropium-albuterol  3 mL Nebulization TID  . levothyroxine   150 mcg Oral QAC breakfast  . mouth rinse  15 mL Mouth Rinse BID  . mometasone-formoterol  2 puff Inhalation BID  . phosphorus  500 mg Oral TID  . piperacillin-tazobactam (ZOSYN)  IV  3.375 g Intravenous Q8H  . potassium chloride  40 mEq Oral BID  . sodium chloride flush  3 mL Intravenous Q12H   Continuous Infusions: . sodium chloride 75 mL/hr at 12/08/16 1108   PRN Meds:.acetaminophen **OR** acetaminophen, chlorproMAZINE, fentaNYL (  SUBLIMAZE) injection, oxyCODONE, promethazine, traZODone  Micro Results Recent Results (from the past 240 hour(s))  Culture, blood (routine x 2)     Status: None (Preliminary result)   Collection Time: 12/03/16 12:40 PM  Result Value Ref Range Status   Specimen Description BLOOD RIGHT ANTECUBITAL  Final   Special Requests BOTTLES DRAWN AEROBIC AND ANAEROBIC  5CC  Final   Culture NO GROWTH 4 DAYS  Final   Report Status PENDING  Incomplete  Culture, blood (routine x 2)     Status: None (Preliminary result)   Collection Time: 12/03/16 12:49 PM  Result Value Ref Range Status   Specimen Description BLOOD LEFT HAND  Final   Special Requests IN PEDIATRIC BOTTLE  2CC  Final   Culture NO GROWTH 4 DAYS  Final   Report Status PENDING  Incomplete  Urine culture     Status: Abnormal   Collection Time: 12/03/16  7:39 PM  Result Value Ref Range Status   Specimen Description URINE, CLEAN CATCH  Final   Special Requests NONE  Final   Culture MULTIPLE SPECIES PRESENT, SUGGEST RECOLLECTION (A)  Final   Report Status 12/04/2016 FINAL  Final  Respiratory Panel by PCR     Status: Abnormal   Collection Time: 12/04/16 12:04 AM  Result Value Ref Range Status   Adenovirus NOT DETECTED NOT DETECTED Final   Coronavirus 229E NOT DETECTED NOT DETECTED Final   Coronavirus HKU1 NOT DETECTED NOT DETECTED Final   Coronavirus NL63 DETECTED (A) NOT DETECTED Final   Coronavirus OC43 NOT DETECTED NOT DETECTED Final   Metapneumovirus NOT DETECTED NOT DETECTED Final   Rhinovirus /  Enterovirus NOT DETECTED NOT DETECTED Final   Influenza A NOT DETECTED NOT DETECTED Final   Influenza B NOT DETECTED NOT DETECTED Final   Parainfluenza Virus 1 NOT DETECTED NOT DETECTED Final   Parainfluenza Virus 2 NOT DETECTED NOT DETECTED Final   Parainfluenza Virus 3 NOT DETECTED NOT DETECTED Final   Parainfluenza Virus 4 NOT DETECTED NOT DETECTED Final   Respiratory Syncytial Virus NOT DETECTED NOT DETECTED Final   Bordetella pertussis NOT DETECTED NOT DETECTED Final   Chlamydophila pneumoniae NOT DETECTED NOT DETECTED Final   Mycoplasma pneumoniae NOT DETECTED NOT DETECTED Final  MRSA PCR Screening     Status: None   Collection Time: 12/04/16  5:37 PM  Result Value Ref Range Status   MRSA by PCR NEGATIVE NEGATIVE Final    Comment:        The GeneXpert MRSA Assay (FDA approved for NASAL specimens only), is one component of a comprehensive MRSA colonization surveillance program. It is not intended to diagnose MRSA infection nor to guide or monitor treatment for MRSA infections.   Culture, sputum-assessment     Status: None   Collection Time: 12/04/16  8:55 PM  Result Value Ref Range Status   Specimen Description EXPECTORATED SPUTUM  Final   Special Requests NONE  Final   Sputum evaluation THIS SPECIMEN IS ACCEPTABLE FOR SPUTUM CULTURE  Final   Report Status 12/05/2016 FINAL  Final  Culture, respiratory (NON-Expectorated)     Status: None   Collection Time: 12/04/16  8:55 PM  Result Value Ref Range Status   Specimen Description EXPECTORATED SPUTUM  Final   Special Requests NONE Reflexed from [id number]  Final   Gram Stain   Final    ABUNDANT WBC PRESENT,BOTH PMN AND MONONUCLEAR NO ORGANISMS SEEN    Culture Consistent with normal respiratory flora.  Final   Report Status 12/07/2016  FINAL  Final    Radiology Reports Dg Chest Port 1 View  Result Date: 12/08/2016 CLINICAL DATA:  Pneumonia, fever. EXAM: PORTABLE CHEST 1 VIEW COMPARISON:  Radiograph December 05, 2016. FINDINGS:  Stable cardiomediastinal silhouette no pneumothorax is noted. Stable bibasilar opacities, right greater than left, concerning for pneumonia or atelectasis. Bony thorax is unremarkable. IMPRESSION: Stable bibasilar opacities as described above. Electronically Signed   By: Marijo Conception, M.D.   On: 12/08/2016 07:53   Dg Chest Port 1 View  Result Date: 12/05/2016 CLINICAL DATA:  Follow-up pneumonia EXAM: PORTABLE CHEST 1 VIEW COMPARISON:  12/03/2016 FINDINGS: Cardiac shadow is mildly enlarged but stable. The lungs are well aerated bilaterally. Persistent right basilar infiltrate is seen. Increasing left basilar infiltrate is noted as well. No other focal abnormality is seen. No bony abnormality is noted. IMPRESSION: Stable right basilar infiltrates. Increasing left basilar infiltrate. Electronically Signed   By: Inez Catalina M.D.   On: 12/05/2016 07:50   Dg Chest Port 1 View  Result Date: 12/03/2016 CLINICAL DATA:  Sudden onset shortness of breath yesterday. EXAM: PORTABLE CHEST 1 VIEW COMPARISON:  10/30/2016 FINDINGS: Consolidation noted in both lower lobes, right greater than left compatible with multifocal pneumonia. Suspect small right effusion. Heart is normal size. IMPRESSION: Bilateral lower lobe airspace consolidation, right greater than left compatible with multifocal pneumonia. Suspect small right effusion. Electronically Signed   By: Rolm Baptise M.D.   On: 12/03/2016 10:57    Laquinton Bihm M.D on 12/08/2016 at 1:01 PM  Between 7am to 7pm - Pager - [phone]  After 7pm go to [email or web address] - password [facility]  [facility] -  Office  [phone]

## 2016-12-08 NOTE — Progress Notes (Signed)
Physical Therapy Treatment Patient Details Name: [name] MRN: [id number] DOB: [birth year] Today's Date: 12/08/2016    History of Present Illness Pt is a 71 y/o male admitted secondary to abdominal pain, found to have HCAP/CAP and sepsis. 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    PT Comments    Pt performed decreased activity and required increased supplemental O2 during session.  RN aware and monitoring closely.  Will continue to push mobility during hospitalization to improve overall lung function.    Follow Up Recommendations  Home health PT     Equipment Recommendations  None recommended by PT    Recommendations for Other Services       Precautions / Restrictions Restrictions Weight Bearing Restrictions: No    Mobility  Bed Mobility Overal bed mobility: Modified Independent Bed Mobility: Supine to Sit;Sit to Supine     Supine to sit: Modified independent (Device/Increase time) Sit to supine: Modified independent (Device/Increase time)   General bed mobility comments: good technique with increased time and effort.    Transfers Overall transfer level: Needs assistance Equipment used: Rolling walker (2 wheeled) Transfers: Sit to/from Stand Sit to Stand: Supervision         General transfer comment: Cues for safety with RW.    Ambulation/Gait Ambulation/Gait assistance: Supervision Ambulation Distance (Feet): 65 Feet (+ 70 ft.  ) Assistive device: Rolling walker (2 wheeled) Gait Pattern/deviations: Step-through pattern;Decreased stride length;Trunk flexed Gait velocity: decreased   General Gait Details: PTA into to assess patient on RA, removed O2 and patient destaurated to 79% on RA, reapplied 4L and only able to return to 86%, required 6L with increased time to return to 91%,  Pt began to ambulate and decreased to 79% on 6L via nasal cannula.  Pt required placement of venti mask on 8L at 40% but only able  to recover to 92%, RN them switched to 10L @ 45% and O2 sats improved to 95%.  From there PTA continued gait back to room with patient desaturating to 89% and wuickly recovering to 94% on 10 L at 45% venti mask.     Stairs            Wheelchair Mobility    Modified Rankin (Stroke Patients Only)       Balance Overall balance assessment: Needs assistance   Sitting balance-Leahy Scale: Normal       Standing balance-Leahy Scale: Good                      Cognition Arousal/Alertness: Awake/alert Behavior During Therapy: WFL for tasks assessed/performed Overall Cognitive Status: Within Functional Limits for tasks assessed                      Exercises      General Comments        Pertinent Vitals/Pain Pain Assessment: No/denies pain    Home Living                      Prior Function            PT Goals (current goals can now be found in the care plan section) Acute Rehab PT Goals Patient Stated Goal: return home Potential to Achieve Goals: Good Progress towards PT goals: Progressing toward goals    Frequency    Min 3X/week      PT Plan Current plan remains appropriate    Co-evaluation  End of Session Equipment Utilized During Treatment: Gait belt Activity Tolerance: Patient limited by fatigue Patient left: in chair;with call bell/phone within reach     Time: [id number] PT Time Calculation (min) (ACUTE ONLY): 33 min  Charges:  $Gait Training: 23-37 mins                    G Codes:      Cristela Blue 2017-01-06, 4:51 PM  Governor Rooks, PTA pager [phone]

## 2016-12-08 NOTE — Progress Notes (Signed)
1600 CPT not done per pt request and due to pt recently vomiting.

## 2016-12-08 NOTE — Progress Notes (Signed)
RT found pt on room air, sat 83-85%.  Rt gave neb on O2 & MDI treatment, sat recovered to 91%.  RT will placed pt on nasal cannula 4 Lpm & notify RN.

## 2016-12-08 NOTE — Consult Note (Signed)
   [facility] CM Inpatient Consult   12/08/2016  [name] September 10, 1946 [id number]   Patient assess for re-admission within the past 30 days with the Medicare ACO Registry.  Chart review reveals the patient is [name] a 71 y.o.malewith medical history significant of anxiety, depression, GERD, HTN, hypothyroidism, squamous cell carcinoma of the esophagus presenting with three-day history, prior to admission,  of right upper quadrant pain . Worse with deep inspiration. Associated with cough and shortness of breath and generalized malaise, work up significant for HCAP. No current THN needs noted.  Spoke with inpatient RNCM regarding patient being eligible.  Can continue to follow up for community needs, await disposition assessment.  Please contact:  Natividad Brood, RN BSN [place] Liaison  [phone] business mobile phone Toll free office [phone]

## 2016-12-09 ENCOUNTER — Inpatient Hospital Stay (HOSPITAL_COMMUNITY): Payer: Medicare Other

## 2016-12-09 LAB — CBC
HEMATOCRIT: 25.2 % — AB (ref 39.0–52.0)
Hemoglobin: 8.1 g/dL — ABNORMAL LOW (ref 13.0–17.0)
MCH: 28.2 pg (ref 26.0–34.0)
MCHC: 32.1 g/dL (ref 30.0–36.0)
MCV: 87.8 fL (ref 78.0–100.0)
PLATELETS: 354 10*3/uL (ref 150–400)
RBC: 2.87 MIL/uL — ABNORMAL LOW (ref 4.22–5.81)
RDW: 14.4 % (ref 11.5–15.5)
WBC: 8.2 10*3/uL (ref 4.0–10.5)

## 2016-12-09 LAB — BASIC METABOLIC PANEL
ANION GAP: 7 (ref 5–15)
BUN: <5 mg/dL — ABNORMAL LOW (ref 6–20)
CALCIUM: 7.9 mg/dL — AB (ref 8.9–10.3)
CO2: 26 mmol/L (ref 22–32)
Chloride: 102 mmol/L (ref 101–111)
Creatinine, Ser: 0.69 mg/dL (ref 0.61–1.24)
Glucose, Bld: 110 mg/dL — ABNORMAL HIGH (ref 65–99)
Potassium: 3 mmol/L — ABNORMAL LOW (ref 3.5–5.1)
Sodium: 135 mmol/L (ref 135–145)

## 2016-12-09 LAB — PROCALCITONIN: Procalcitonin: 2.5 ng/mL

## 2016-12-09 MED ORDER — RISAQUAD PO CAPS
2.0000 | ORAL_CAPSULE | Freq: Every day | ORAL | Status: DC
  Administered 2016-12-09 – 2016-12-10 (×2): 2 via ORAL
  Filled 2016-12-09 (×2): qty 2

## 2016-12-09 MED ORDER — POTASSIUM CHLORIDE CRYS ER 20 MEQ PO TBCR
40.0000 meq | EXTENDED_RELEASE_TABLET | Freq: Two times a day (BID) | ORAL | Status: AC
  Administered 2016-12-09 (×2): 40 meq via ORAL
  Filled 2016-12-09 (×2): qty 2

## 2016-12-09 NOTE — Care Management Important Message (Signed)
Important Message  Patient Details  Name: [name] MRN: [id number] Date of Birth: 10-09-1946   Medicare Important Message Given:  Yes    Nathen May 12/09/2016, 10:32 AM

## 2016-12-09 NOTE — Progress Notes (Signed)
PROGRESS NOTE                                                                                                                                                                                                             Patient Demographics:    [name], is a 71 y.o. male, DOB - 12-20-1945, [id number]  Admit date - 12/03/2016   Admitting Physician Waldemar Dickens, MD  Outpatient Primary MD for the patient is Reginia Naas, MD  LOS - 6    Chief Complaint  Patient presents with  . Abdominal Pain       Brief Narrative   [name] is a 71 y.o. male with medical history significant of anxiety, depression, GERD, HTN, hypothyroidism, squamous cell carcinoma of the esophagus presenting with three-day history of right upper quadrant pain. Worse with deep inspiration. Associated with cough and shortness of breath and generalized malaise, work up significant for HCAP, Patient continues to improve, but on 1/7 he had an episode of fever, respiratory distress and hypoxia, CT was obtained, no evidence of PE, there is fluid at the end of the esophagus, currently appears to be improving after adding vancomycin, GI consulted for possible esophageal stricture.   Subjective:    [name] today has, No headache, No chest pain,  Reports he is feeling a lot better today than yesterday, dyspnea has improved, cough has subsided , afebrile in the last 24 hours .    Assessment  & Plan :    Active Problems:   CAP (community acquired pneumonia)   HCAP (healthcare-associated pneumonia)   Sepsis ([place])   Acute respiratory failure ([place])   History of cholecystectomy   Hypertension   Chronic pain   GERD (gastroesophageal reflux disease)   Sepsis secondary to HCAP - Febrile, tachycardic, tachypneic with elevated lactic acid and acute hypoxic respiratory failure  - Recent hospitalization , as well as nausea and vomiting , changed cefepime to Zosyn for  possible aspiration .Started vancomycin 1/5, Stopped 1/7, resumed and vancomycin 1/8 given fever and worsening respiratory failure. - Respiratory panel growing coronavirus, so most likely viral component to respiratory failure  - Patient with fever 1011/7, will tachypneic in respiratory distress, CTA chest was obtained, no evidence of PE, significant multilobar pneumonia, fluid at the end of the esophagus, patient appears to be improving,. -  CTA chest showing fluid filling slightly distended esophagus, given the fact patient with suspicious recurrent aspiration pneumonia will obtain esophageal gram to rule out obstruction as an recurrent vomiting, equal GI consulted for further evaluation   acute hypoxic respiratory failure  - Initially weaned off oxygen, but with worsening respiratory failure back on oxygen, improving today, back to 4 L nasal cannula from Ventimask yesterday .  Cholecystectomy:  -Lap converted open. Patient is postop approximately 2-1/2 weeks. Evaluated by Dr.Byerly at time of admission. Unlikely abdominal complication from cholecystectomy at this point  HTN: - Blood pressure on the lower side on admission, currently improved, so resumed on amlodipine.  Chronic pain: - continue home oxy, but will change to when necessary  Hypophosphatemia/hypokalemia - Repleted  GERD: - continue PPI  Hypothyriod: - continue synthroid  Trace pericardial effusion on CT chest - We'll obtain 2-D echo  Code Status : Full  Family Communication  : wife at bedside  Disposition Plan  : Home when stable  Consults  :  None  Procedures  : None  DVT Prophylaxis  :  Heparin - SCDs   Lab Results  Component Value Date   PLT 354 12/09/2016    Antibiotics  :    Anti-infectives    Start     Dose/Rate Route Frequency Ordered Stop   12/08/16 1830  vancomycin (VANCOCIN) IVPB 1000 mg/200 mL premix     1,000 mg 200 mL/hr over 60 Minutes Intravenous Every 12 hours 12/08/16 1724      12/04/16 1800  vancomycin (VANCOCIN) IVPB 1000 mg/200 mL premix  Status:  Discontinued     1,000 mg 200 mL/hr over 60 Minutes Intravenous Every 12 hours 12/04/16 1417 12/05/16 1408   12/04/16 1400  piperacillin-tazobactam (ZOSYN) IVPB 3.375 g     3.375 g 12.5 mL/hr over 240 Minutes Intravenous Every 8 hours 12/04/16 0742     12/04/16 0500  vancomycin (VANCOCIN) IVPB 750 mg/150 ml premix  Status:  Discontinued     750 mg 150 mL/hr over 60 Minutes Intravenous Every 12 hours 12/03/16 1727 12/04/16 1417   12/03/16 1700  ceFEPIme (MAXIPIME) 1 g in dextrose 5 % 50 mL IVPB  Status:  Discontinued     1 g 100 mL/hr over 30 Minutes Intravenous Every 8 hours 12/03/16 1637 12/04/16 0713   12/03/16 1700  vancomycin (VANCOCIN) 1,250 mg in sodium chloride 0.9 % 250 mL IVPB     1,250 mg 166.7 mL/hr over 90 Minutes Intravenous  Once 12/03/16 1653 12/03/16 2010   12/03/16 1215  cefTRIAXone (ROCEPHIN) 1 g in dextrose 5 % 50 mL IVPB     1 g 100 mL/hr over 30 Minutes Intravenous  Once 12/03/16 1209 12/03/16 1330   12/03/16 1215  azithromycin (ZITHROMAX) 500 mg in dextrose 5 % 250 mL IVPB     500 mg 250 mL/hr over 60 Minutes Intravenous  Once 12/03/16 1209 12/03/16 1440        Objective:   Vitals:   12/09/16 0554 12/09/16 0926 12/09/16 0929 12/09/16 1021  BP: 117/71   117/69  Pulse: 81     Resp: 18     Temp: 98.2 F (36.8 C)     TempSrc: Oral     SpO2: 94% 94% 96%   Weight:      Height:        Wt Readings from Last 3 Encounters:  12/03/16 83.9 kg (185 lb)  12/02/16 83.6 kg (184 lb 6.4 oz)  11/06/16 90.9 kg (200  lb 6.4 oz)     Intake/Output Summary (Last 24 hours) at 12/09/16 1246 Last data filed at 12/09/16 0718  Gross per 24 hour  Intake          2461.25 ml  Output              850 ml  Net          1611.25 ml     Physical Exam  Awake Alert, Oriented X 3,  Supple Neck,No JVD, Symmetrical Chest wall movement,fair  air movement bilaterally, bibasilar rhonchi, no wheezing RRR,No  Gallops,Rubs or new Murmurs, No Parasternal Heave +ve B.Sounds, Abd Soft,no  tenderness, No rebound - guarding or rigidity. No Cyanosis, Clubbing or edema, No new Rash or bruise      Data Review:    CBC  Recent Labs Lab 12/05/16 0619 12/06/16 0334 12/07/16 0519 12/08/16 0835 12/09/16 0723  WBC 11.5* 10.0 12.1* 11.0* 8.2  HGB 8.3* 8.6* 9.1* 8.9* 8.1*  HCT 25.2* 26.5* 28.0* 27.7* 25.2*  PLT 217 254 285 314 354  MCV 88.1 88.3 87.8 87.4 87.8  MCH 29.0 28.7 28.5 28.1 28.2  MCHC 32.9 32.5 32.5 32.1 32.1  RDW 14.0 14.0 14.1 14.2 14.4    Chemistries   Recent Labs Lab 12/03/16 1114  12/05/16 0619 12/05/16 1223 12/06/16 0334 12/07/16 0519 12/08/16 0835 12/09/16 0723  NA 135  < > 134*  --  134* 133* 136 135  K 3.9  < > 3.5  --  3.4* 3.4* 3.2* 3.0*  CL 99*  < > 103  --  102 99* 101 102  CO2 27  < > 23  --  25 24 26 26   GLUCOSE 105*  < > 118*  --  95 94 98 110*  BUN 19  < > 11  --  6 <5* <5* <5*  CREATININE 1.43*  < > 0.75  --  0.75 0.71 0.77 0.69  CALCIUM 9.0  < > 7.8*  --  8.0* 8.2* 7.8* 7.9*  MG  --   --   --  1.8  --   --   --   --   AST 44*  --   --   --   --   --   --   --   ALT 38  --   --   --   --   --   --   --   ALKPHOS 28*  --   --   --   --   --   --   --   BILITOT 1.0  --   --   --   --   --   --   --   < > = values in this interval not displayed. ------------------------------------------------------------------------------------------------------------------ No results for input(s): CHOL, HDL, LDLCALC, TRIG, CHOLHDL, LDLDIRECT in the last 72 hours.  No results found for: HGBA1C ------------------------------------------------------------------------------------------------------------------ No results for input(s): TSH, T4TOTAL, T3FREE, THYROIDAB in the last 72 hours.  Invalid input(s): FREET3 ------------------------------------------------------------------------------------------------------------------ No results for input(s): VITAMINB12, FOLATE,  FERRITIN, TIBC, IRON, RETICCTPCT in the last 72 hours.  Coagulation profile  Recent Labs Lab 12/03/16 1809  INR 1.24    No results for input(s): DDIMER in the last 72 hours.  Cardiac Enzymes  Recent Labs Lab 12/03/16 1809  TROPONINI <0.03   ------------------------------------------------------------------------------------------------------------------ No results found for: BNP  Inpatient Medications  Scheduled Meds: . acidophilus  2 capsule Oral Daily  . amLODipine  10 mg Oral Daily  .  DULoxetine  90 mg Oral Daily  . famotidine  20 mg Oral Daily  . feeding supplement (ENSURE ENLIVE)  237 mL Oral BID BM  . heparin  5,000 Units Subcutaneous Q8H  . ipratropium-albuterol  3 mL Nebulization TID  . levothyroxine  150 mcg Oral QAC breakfast  . mouth rinse  15 mL Mouth Rinse BID  . mometasone-formoterol  2 puff Inhalation BID  . piperacillin-tazobactam (ZOSYN)  IV  3.375 g Intravenous Q8H  . potassium chloride  40 mEq Oral BID  . sodium chloride flush  3 mL Intravenous Q12H  . vancomycin  1,000 mg Intravenous Q12H   Continuous Infusions: . sodium chloride 75 mL/hr at 12/09/16 0223   PRN Meds:.acetaminophen **OR** acetaminophen, chlorproMAZINE, fentaNYL (SUBLIMAZE) injection, oxyCODONE, promethazine, traZODone  Micro Results Recent Results (from the past 240 hour(s))  Culture, blood (routine x 2)     Status: None   Collection Time: 12/03/16 12:40 PM  Result Value Ref Range Status   Specimen Description BLOOD RIGHT ANTECUBITAL  Final   Special Requests BOTTLES DRAWN AEROBIC AND ANAEROBIC  5CC  Final   Culture NO GROWTH 5 DAYS  Final   Report Status 12/08/2016 FINAL  Final  Culture, blood (routine x 2)     Status: None   Collection Time: 12/03/16 12:49 PM  Result Value Ref Range Status   Specimen Description BLOOD LEFT HAND  Final   Special Requests IN PEDIATRIC BOTTLE  2CC  Final   Culture NO GROWTH 5 DAYS  Final   Report Status 12/08/2016 FINAL  Final  Urine  culture     Status: Abnormal   Collection Time: 12/03/16  7:39 PM  Result Value Ref Range Status   Specimen Description URINE, CLEAN CATCH  Final   Special Requests NONE  Final   Culture MULTIPLE SPECIES PRESENT, SUGGEST RECOLLECTION (A)  Final   Report Status 12/04/2016 FINAL  Final  Respiratory Panel by PCR     Status: Abnormal   Collection Time: 12/04/16 12:04 AM  Result Value Ref Range Status   Adenovirus NOT DETECTED NOT DETECTED Final   Coronavirus 229E NOT DETECTED NOT DETECTED Final   Coronavirus HKU1 NOT DETECTED NOT DETECTED Final   Coronavirus NL63 DETECTED (A) NOT DETECTED Final   Coronavirus OC43 NOT DETECTED NOT DETECTED Final   Metapneumovirus NOT DETECTED NOT DETECTED Final   Rhinovirus / Enterovirus NOT DETECTED NOT DETECTED Final   Influenza A NOT DETECTED NOT DETECTED Final   Influenza B NOT DETECTED NOT DETECTED Final   Parainfluenza Virus 1 NOT DETECTED NOT DETECTED Final   Parainfluenza Virus 2 NOT DETECTED NOT DETECTED Final   Parainfluenza Virus 3 NOT DETECTED NOT DETECTED Final   Parainfluenza Virus 4 NOT DETECTED NOT DETECTED Final   Respiratory Syncytial Virus NOT DETECTED NOT DETECTED Final   Bordetella pertussis NOT DETECTED NOT DETECTED Final   Chlamydophila pneumoniae NOT DETECTED NOT DETECTED Final   Mycoplasma pneumoniae NOT DETECTED NOT DETECTED Final  MRSA PCR Screening     Status: None   Collection Time: 12/04/16  5:37 PM  Result Value Ref Range Status   MRSA by PCR NEGATIVE NEGATIVE Final    Comment:        The GeneXpert MRSA Assay (FDA approved for NASAL specimens only), is one component of a comprehensive MRSA colonization surveillance program. It is not intended to diagnose MRSA infection nor to guide or monitor treatment for MRSA infections.   Culture, sputum-assessment     Status: None  Collection Time: 12/04/16  8:55 PM  Result Value Ref Range Status   Specimen Description EXPECTORATED SPUTUM  Final   Special Requests NONE   Final   Sputum evaluation THIS SPECIMEN IS ACCEPTABLE FOR SPUTUM CULTURE  Final   Report Status 12/05/2016 FINAL  Final  Culture, respiratory (NON-Expectorated)     Status: None   Collection Time: 12/04/16  8:55 PM  Result Value Ref Range Status   Specimen Description EXPECTORATED SPUTUM  Final   Special Requests NONE Reflexed from [id number]  Final   Gram Stain   Final    ABUNDANT WBC PRESENT,BOTH PMN AND MONONUCLEAR NO ORGANISMS SEEN    Culture Consistent with normal respiratory flora.  Final   Report Status 12/07/2016 FINAL  Final    Radiology Reports Ct Angio Chest Pe W Or Wo Contrast  Result Date: 12/08/2016 CLINICAL DATA:  Difficulty breathing history of pneumonia EXAM: CT ANGIOGRAPHY CHEST WITH CONTRAST TECHNIQUE: Multidetector CT imaging of the chest was performed using the standard protocol during bolus administration of intravenous contrast. Multiplanar CT image reconstructions and MIPs were obtained to evaluate the vascular anatomy. CONTRAST:  100 mL Isovue 370 intravenous COMPARISON:  Chest x-ray 12/08/2016, CT chest 03/13/2016 FINDINGS: Cardiovascular: Satisfactory opacification of the pulmonary arteries to the segmental level. No evidence of pulmonary embolism. Heart size upper normal. Trace pericardial effusion. Ectatic ascending thoracic aorta, measuring up to 3.7 cm in maximum diameter. Atherosclerotic calcifications are present. No dissection is seen. Mediastinum/Nodes: Trachea within normal limits. Mild mediastinal adenopathy, a lymph node anterior to the left bronchus measures 11 mm. Subcarinal lymph node measures 2 cm. Mild fluid-filled esophagus. Small hiatal hernia. Lungs/Pleura: Small bilateral pleural effusions. Extensive consolidation and ground-glass density in the right lower lobe with moderate ground-glass density and consolidation in the left lower lobe. Scattered foci of ground-glass density in the lingula, posterior aspect of the right middle lobe, and the posterior  aspect of the right upper lobe. Previously noted left lower lobe nodule is obscured. A small right apical lung nodule is grossly unchanged. A calcified nodule in the right cardio phrenic angle is unchanged. No pneumothorax Upper Abdomen: Calcified splenic granuloma. 4.1 cm exophytic cyst upper pole left kidney. Musculoskeletal: Multilevel degenerative changes. No acute or suspicious bone lesion. Review of the MIP images confirms the above findings. IMPRESSION: 1. No CT evidence for acute pulmonary embolus or aortic dissection. 2. Small bilateral pleural effusions with multifocal consolidations and ground-glass densities compatible with multifocal pneumonia. There is mild mediastinal adenopathy which is probably reactive. 3. Mild fluid filled slightly distended esophagus which may be related to a small distal hiatal hernia, nonemergent barium swallow could be performed if there are symptoms suggestive of obstruction. 4. Trace pericardial effusion Electronically Signed   By: Donavan Foil M.D.   On: 12/08/2016 20:18   Dg Chest Port 1 View  Result Date: 12/08/2016 CLINICAL DATA:  Pneumonia, fever. EXAM: PORTABLE CHEST 1 VIEW COMPARISON:  Radiograph December 05, 2016. FINDINGS: Stable cardiomediastinal silhouette no pneumothorax is noted. Stable bibasilar opacities, right greater than left, concerning for pneumonia or atelectasis. Bony thorax is unremarkable. IMPRESSION: Stable bibasilar opacities as described above. Electronically Signed   By: Marijo Conception, M.D.   On: 12/08/2016 07:53   Dg Chest Port 1 View  Result Date: 12/05/2016 CLINICAL DATA:  Follow-up pneumonia EXAM: PORTABLE CHEST 1 VIEW COMPARISON:  12/03/2016 FINDINGS: Cardiac shadow is mildly enlarged but stable. The lungs are well aerated bilaterally. Persistent right basilar infiltrate is seen. Increasing left basilar infiltrate  is noted as well. No other focal abnormality is seen. No bony abnormality is noted. IMPRESSION: Stable right basilar  infiltrates. Increasing left basilar infiltrate. Electronically Signed   By: Inez Catalina M.D.   On: 12/05/2016 07:50   Dg Chest Port 1 View  Result Date: 12/03/2016 CLINICAL DATA:  Sudden onset shortness of breath yesterday. EXAM: PORTABLE CHEST 1 VIEW COMPARISON:  10/30/2016 FINDINGS: Consolidation noted in both lower lobes, right greater than left compatible with multifocal pneumonia. Suspect small right effusion. Heart is normal size. IMPRESSION: Bilateral lower lobe airspace consolidation, right greater than left compatible with multifocal pneumonia. Suspect small right effusion. Electronically Signed   By: Rolm Baptise M.D.   On: 12/03/2016 10:57    Clemencia Helzer M.D on 12/09/2016 at 12:46 PM  Between 7am to 7pm - Pager - [phone]  After 7pm go to [email or web address] - password [facility]  [facility] -  Office  [phone]

## 2016-12-09 NOTE — Progress Notes (Signed)
Patient stated he did not wish to do CPT at this time, due to patient being sore. He stated he was fine and the coughing hurt to much. RT stated if he changed his mind we would come back, RT will continue to monitor.

## 2016-12-09 NOTE — Progress Notes (Signed)
Physical Therapy Treatment Patient Details Name: [name] MRN: [id number] DOB: [birth year] Today's Date: 12/09/2016    History of Present Illness Pt is a 71 y/o male admitted secondary to abdominal pain, found to have HCAP/CAP and sepsis. 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    PT Comments   Pt performed better today, requiring less O2 during ambulation as compared to yesterday and did not require as many rest breaks today. Pt would benefit from stair training next session and continued gait training for endurance.    Follow Up Recommendations  Home health PT     Equipment Recommendations  None recommended by PT    Recommendations for Other Services       Precautions / Restrictions Precautions Precautions: None Precaution Comments: watch O2 sats Restrictions Weight Bearing Restrictions: No    Mobility  Bed Mobility Overal bed mobility: Modified Independent Bed Mobility: Supine to Sit     Supine to sit: Modified independent (Device/Increase time)     General bed mobility comments: improved speed to complete transfer.  Transfers Overall transfer level: Needs assistance Equipment used: Rolling walker (2 wheeled) Transfers: Sit to/from Stand Sit to Stand: Min guard         General transfer comment: for safety   Ambulation/Gait Ambulation/Gait assistance: Min guard Ambulation Distance (Feet): 65 Feet (+ 75 feet) Assistive device: Rolling walker (2 wheeled) Gait Pattern/deviations: Step-through pattern;Decreased stride length;Trunk flexed Gait velocity: decreased   General Gait Details: checked O2 prior to gait: 93 on 4L of O2 [place], increased to 6L for ambulation based on decreased O2 sats from previous session. During first gait trial, pt required one standing rest break due to O2 stats dropping to 88, recovered to 93. Pt required one sitting rest break due to O2 dropping to 87, recovered to 94. During  second trial, pt able to maintain O2 sats of 91-94 but requested to return to room due to fatigue.    Stairs            Wheelchair Mobility    Modified Rankin (Stroke Patients Only)       Balance Overall balance assessment: Needs assistance Sitting-balance support: Feet supported;No upper extremity supported Sitting balance-Leahy Scale: Normal     Standing balance support: During functional activity;No upper extremity supported Standing balance-Leahy Scale: Good Standing balance comment: min guard for balance during perianal care. Pt able to stand without rolling walker to wipe his bottom.                     Cognition Arousal/Alertness: Awake/alert Behavior During Therapy: WFL for tasks assessed/performed Overall Cognitive Status: Within Functional Limits for tasks assessed                      Exercises      General Comments        Pertinent Vitals/Pain Pain Assessment: No/denies pain    Home Living                      Prior Function            PT Goals (current goals can now be found in the care plan section) Acute Rehab PT Goals Patient Stated Goal: return home PT Goal Formulation: With patient Time For Goal Achievement: 12/20/16 Potential to Achieve Goals: Good Progress towards PT goals: Progressing toward goals    Frequency    Min 3X/week  PT Plan Current plan remains appropriate    Co-evaluation             End of Session Equipment Utilized During Treatment: Gait belt;Oxygen Activity Tolerance: Patient limited by fatigue Patient left: in chair;with call bell/phone within reach;with family/visitor present     Time: [id number] PT Time Calculation (min) (ACUTE ONLY): 29 min  Charges:  $Gait Training: 23-37 mins                    G Codes:      Cristela Blue 12/25/16, 11:40 AM  Governor Rooks, PTA pager [phone]

## 2016-12-09 NOTE — Progress Notes (Signed)
Pt asleep, resting comfortably, no distress. Will attempt CPT at next scheduled time.

## 2016-12-09 NOTE — Consult Note (Signed)
Referring Provider: Dr. Emeline Gins Primary Care Physician:  Reginia Naas, MD Primary Gastroenterologist:  Dr. Michail Sermon   Reason for Consultation: Rule out Esophageal stricture  HPI: [name] is a 71 y.o. 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 Patient underwent CT angiogram of chest which showed no evidence of PE but showed mild fluid-filled slightly distended esophagus could be related to small hiatal hernia. GI is consulted for further evaluation.  Patient seen and examined. Asian's main concern at this time is difficulty breathing, cough and congestion. Patient had some nausea earlier which is resolved now. Denied any vomiting. Denied dysphagia or odynophagia. Occasional acid reflux. Patient has on and off sharp right upper quadrant pain since his gallbladder surgery which is also improving per patient. Having one to 2 loose stools per day since cholecystectomy. Denied blood in the stool or black stool  Patient underwent laparoscopic converted to open subtotal cholecystectomy and lysis of adhesions as well as well wedge biopsy of the liver lesion on December 1 by Dr. Donne Hazel. - Biopsy negative for malignancy. Showed calcified nodule.   PAST GI: ----------- - History of Crohn's disease of ileum status post ileocecectomy in 1970 - Colonoscopy 09/2015 showed diminutive rectal polyps which were hyperplastic. Otherwise no abnormality was documented.  Past Medical History:  Diagnosis Date  . Anxiety state, unspecified   . Arthritis    osteo arthritis  . Chronic airway obstruction, not elsewhere classified   . Depressive disorder, not elsewhere classified   . GERD (gastroesophageal reflux disease)   . Hoarseness of voice   . Hypertension   . Hypothyroidism   . Osteoporosis   .  Reflux gastritis   . S/P radiation therapy 06/14/2015 through 07/30/2015    Glottic larynx 6600 cGy in 33 sessions   . Squamous cell carcinoma of right vocal cord ([place]) 04/23/2015  . Tobacco chew use     Past Surgical History:  Procedure Laterality Date  . ABDOMINAL SURGERY  1971   terminal ileum removed, ileitis   . Biospy of the Right True Vocal Cord Right 04/23/2015  . CHOLECYSTECTOMY N/A 10/31/2016   Procedure: OPEN CHOLECYSTECTOMY;  Surgeon: Rolm Bookbinder, MD;  Location: [place] [facility];  Service: General;  Laterality: N/A;  . MICROLARYNGOSCOPY Right 04/23/2015   Procedure: MICROLARYNGOSCOPY WITH BIOPSY RIGHT TRUE VOCAL CORD;  Surgeon: Izora Gala, MD;  Location: [place];  Service: ENT;  Laterality: Right;  . SHOULDER SURGERY    . TONSILLECTOMY     as child    Prior to Admission medications   Medication Sig Start Date End Date Taking? Authorizing Provider  albuterol (VENTOLIN HFA) 108 (90 BASE) MCG/ACT inhaler Inhale 2 puffs into the lungs every 6 (six) hours as needed. 05/05/13  Yes Elsie Stain, MD  amLODipine (NORVASC) 10 MG tablet Take 10 mg by mouth daily. 06/06/16  Yes Historical Provider, MD  DULoxetine (CYMBALTA) 30 MG capsule Take 3 capsules by mouth daily.   Yes Historical Provider, MD  levothyroxine (SYNTHROID, LEVOTHROID) 150 MCG tablet Take 150 mcg by mouth daily.     Yes Historical Provider, MD  mometasone-formoterol (DULERA) 100-5 MCG/ACT AERO Take 2 puffs first thing in am and then another 2 puffs about 12 hours later. 08/11/16  Yes Javier Glazier, MD  nicotine polacrilex (NICORETTE) 4 MG gum Take 4  mg by mouth as needed for smoking cessation.   Yes Historical Provider, MD  ondansetron (ZOFRAN) 8 MG tablet Take 8 mg by mouth every 8 (eight) hours as needed for nausea. 09/18/16  Yes Historical Provider, MD  oxyCODONE (ROXICODONE) 15 MG immediate release tablet Take 0.5 tablets (7.5 mg  total) by mouth 4 (four) times daily. 11/06/16  Yes Robbie Lis, MD  ranitidine (ZANTAC) 150 MG capsule One at bedtime Patient taking differently: Take 150 mg by mouth every evening. One at bedtime 12/08/13  Yes Tanda Rockers, MD  testosterone cypionate (DEPOTESTOTERONE CYPIONATE) 200 MG/ML injection Inject 0.5 mLs into the muscle once a week.   Yes Historical Provider, MD  azithromycin (ZITHROMAX) 250 MG tablet 2 today then one daily Patient not taking: Reported on 12/03/2016 12/02/16   Deneise Lever, MD  Zoledronic Acid (ZOMETA IV) Inject into the vein every 3 (three) months.    Historical Provider, MD    Scheduled Meds: . acidophilus  2 capsule Oral Daily  . amLODipine  10 mg Oral Daily  . DULoxetine  90 mg Oral Daily  . famotidine  20 mg Oral Daily  . feeding supplement (ENSURE ENLIVE)  237 mL Oral BID BM  . heparin  5,000 Units Subcutaneous Q8H  . ipratropium-albuterol  3 mL Nebulization TID  . levothyroxine  150 mcg Oral QAC breakfast  . mouth rinse  15 mL Mouth Rinse BID  . mometasone-formoterol  2 puff Inhalation BID  . piperacillin-tazobactam (ZOSYN)  IV  3.375 g Intravenous Q8H  . potassium chloride  40 mEq Oral BID  . sodium chloride flush  3 mL Intravenous Q12H  . vancomycin  1,000 mg Intravenous Q12H   Continuous Infusions: . sodium chloride 75 mL/hr at 12/09/16 0223   PRN Meds:.acetaminophen **OR** acetaminophen, chlorproMAZINE, fentaNYL (SUBLIMAZE) injection, oxyCODONE, promethazine, traZODone  Allergies as of 12/03/2016 - Review Complete 12/03/2016  Allergen Reaction Noted  . Doxycycline Nausea And Vomiting   . Guaifenesin Nausea And Vomiting   . Morphine sulfate Other (See Comments) 09/18/2014  . Tapentadol Other (See Comments) 11/03/2012    Family History  Problem Relation Age of Onset  . Prostate cancer Father   . Lung cancer Father   . Allergies Sister     Social History   Social History  . Marital status: Married    Spouse name: N/A  . Number of  children: N/A  . Years of education: N/A   Occupational History  . Not on file.   Social History Main Topics  . Smoking status: Former Smoker    Packs/day: 2.00    Years: 43.00    Types: Cigarettes    Quit date: 12/01/2001  . Smokeless tobacco: Never Used     Comment: 2ppd x 43 years.  Still uses nicotine gum. Quit smoking 13 years ago  . Alcohol use No  . Drug use: No  . Sexual activity: Not on file   Other Topics Concern  . Not on file   Social History Narrative   Originally from [place]. Moved to [place] with job relocation in 1996. Married. Previously also lived in [place]. No international travel. 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 Currently has a dog. No bird exposure. No mold exposure. No hot tub exposure. Does wood working with domestic woods.     Review of Systems:  Review of Systems  Constitutional: Positive for chills, fever and malaise/fatigue.  HENT: Positive for congestion. Negative for ear discharge  and ear pain.   Eyes: Negative for pain and discharge.  Respiratory: Positive for cough, sputum production and shortness of breath.   Cardiovascular: Negative for chest pain and palpitations.  Gastrointestinal: Positive for nausea. Negative for blood in stool, melena and vomiting.  Genitourinary: Negative for frequency and hematuria.  Musculoskeletal: Positive for joint pain.  Skin: Negative for rash.  Neurological: Positive for weakness. Negative for focal weakness and seizures.  Endo/Heme/Allergies: Does not bruise/bleed easily.  Psychiatric/Behavioral: Negative for hallucinations and suicidal ideas.    Physical Exam: Vital signs: Vitals:   12/09/16 0554 12/09/16 1021  BP: 117/71 117/69  Pulse: 81   Resp: 18   Temp: 98.2 F (36.8 C)    Last BM Date: 12/09/16 General:   Alert,  Well-developed, well-nourished, pleasant and cooperative in NAD Head: NS, AT. Oxygen via nasal cannula  Eyes: EOMI. anicteric sclera Oral mucosa moist. No  oral lesion. Lungs:  Coarse breath sounds with significant crackles and rhonchi bilaterally Heart:  Regular rate and rhythm; no murmurs, clicks, rubs,  or gallops. Abdomen: Soft, nontender, mild distended, bowel sounds present. No peritoneal signs. LE : no edema  Psych : Alert oriented 3. Mood and affect normal. Rectal:  Deferred  GI:  Lab Results:  Recent Labs  12/07/16 0519 12/08/16 0835 12/09/16 0723  WBC 12.1* 11.0* 8.2  HGB 9.1* 8.9* 8.1*  HCT 28.0* 27.7* 25.2*  PLT 285 314 354   BMET  Recent Labs  12/07/16 0519 12/08/16 0835 12/09/16 0723  NA 133* 136 135  K 3.4* 3.2* 3.0*  CL 99* 101 102  CO2 24 26 26   GLUCOSE 94 98 110*  BUN <5* <5* <5*  CREATININE 0.71 0.77 0.69  CALCIUM 8.2* 7.8* 7.9*   LFT No results for input(s): PROT, ALBUMIN, AST, ALT, ALKPHOS, BILITOT, BILIDIR, IBILI in the last 72 hours. PT/INR No results for input(s): LABPROT, INR in the last 72 hours.   Studies/Results: Ct Angio Chest Pe W Or Wo Contrast  Result Date: 12/08/2016 CLINICAL DATA:  Difficulty breathing history of pneumonia EXAM: CT ANGIOGRAPHY CHEST WITH CONTRAST TECHNIQUE: Multidetector CT imaging of the chest was performed using the standard protocol during bolus administration of intravenous contrast. Multiplanar CT image reconstructions and MIPs were obtained to evaluate the vascular anatomy. CONTRAST:  100 mL Isovue 370 intravenous COMPARISON:  Chest x-ray 12/08/2016, CT chest 03/13/2016 FINDINGS: Cardiovascular: Satisfactory opacification of the pulmonary arteries to the segmental level. No evidence of pulmonary embolism. Heart size upper normal. Trace pericardial effusion. Ectatic ascending thoracic aorta, measuring up to 3.7 cm in maximum diameter. Atherosclerotic calcifications are present. No dissection is seen. Mediastinum/Nodes: Trachea within normal limits. Mild mediastinal adenopathy, a lymph node anterior to the left bronchus measures 11 mm. Subcarinal lymph node measures 2  cm. Mild fluid-filled esophagus. Small hiatal hernia. Lungs/Pleura: Small bilateral pleural effusions. Extensive consolidation and ground-glass density in the right lower lobe with moderate ground-glass density and consolidation in the left lower lobe. Scattered foci of ground-glass density in the lingula, posterior aspect of the right middle lobe, and the posterior aspect of the right upper lobe. Previously noted left lower lobe nodule is obscured. A small right apical lung nodule is grossly unchanged. A calcified nodule in the right cardio phrenic angle is unchanged. No pneumothorax Upper Abdomen: Calcified splenic granuloma. 4.1 cm exophytic cyst upper pole left kidney. Musculoskeletal: Multilevel degenerative changes. No acute or suspicious bone lesion. Review of the MIP images confirms the above findings. IMPRESSION: 1. No CT evidence for acute  pulmonary embolus or aortic dissection. 2. Small bilateral pleural effusions with multifocal consolidations and ground-glass densities compatible with multifocal pneumonia. There is mild mediastinal adenopathy which is probably reactive. 3. Mild fluid filled slightly distended esophagus which may be related to a small distal hiatal hernia, nonemergent barium swallow could be performed if there are symptoms suggestive of obstruction. 4. Trace pericardial effusion Electronically Signed   By: Donavan Foil M.D.   On: 12/08/2016 20:18   Dg Chest Port 1 View  Result Date: 12/08/2016 CLINICAL DATA:  Pneumonia, fever. EXAM: PORTABLE CHEST 1 VIEW COMPARISON:  Radiograph December 05, 2016. FINDINGS: Stable cardiomediastinal silhouette no pneumothorax is noted. Stable bibasilar opacities, right greater than left, concerning for pneumonia or atelectasis. Bony thorax is unremarkable. IMPRESSION: Stable bibasilar opacities as described above. Electronically Signed   By: Marijo Conception, M.D.   On: 12/08/2016 07:53    Impression/Plan: - Abnormal CT scan showing mild fluid-filled  slightly distended esophagus. Negative follow-up barium swallow for any stricture.  - Nausea without any vomiting. Resolved - Hypoxic respiratory failure. - Sepsis with healthcare acquired pneumonia. - On and off right upper quadrant pain since cholecystectomy. Improving - Status post laparoscopic converted to open subtotal cholecystectomy and lysis of adhesions as well as well wedge biopsy of the liver lesion on December 1 by Dr. Donne Hazel. - Biopsy negative for malignancy. Showed calcified nodule. - Abnormal LFTs. Mild elevated AST noted on admission.  Recommendations ------------------------- - Barium swallow report reviewed. Negative for stricture. Showed esophagitis dysmotility and small sliding hiatal hernia. - We will get right upper quadrant ultrasound for on and off right upper quadrant pain and nausea  - Monitor LFTs. We will get hepatitis panel. - Diet as tolerated. - No plan for inpatient endoscopic workup given his respiratory status - GI will follow    LOS: 6 days   Otis Brace  MD, FACP 12/09/2016, 2:34 PM  Pager [phone] If no answer or after 5 PM call [phone]

## 2016-12-10 ENCOUNTER — Inpatient Hospital Stay (HOSPITAL_COMMUNITY): Payer: Medicare Other

## 2016-12-10 DIAGNOSIS — J189 Pneumonia, unspecified organism: Secondary | ICD-10-CM

## 2016-12-10 DIAGNOSIS — J9601 Acute respiratory failure with hypoxia: Secondary | ICD-10-CM | POA: Diagnosis present

## 2016-12-10 DIAGNOSIS — Z9049 Acquired absence of other specified parts of digestive tract: Secondary | ICD-10-CM

## 2016-12-10 DIAGNOSIS — A419 Sepsis, unspecified organism: Secondary | ICD-10-CM

## 2016-12-10 DIAGNOSIS — K219 Gastro-esophageal reflux disease without esophagitis: Secondary | ICD-10-CM

## 2016-12-10 LAB — HEPATIC FUNCTION PANEL
ALT: 126 U/L — AB (ref 17–63)
AST: 135 U/L — AB (ref 15–41)
Albumin: 2.4 g/dL — ABNORMAL LOW (ref 3.5–5.0)
Alkaline Phosphatase: 28 U/L — ABNORMAL LOW (ref 38–126)
BILIRUBIN DIRECT: 0.1 mg/dL (ref 0.1–0.5)
BILIRUBIN TOTAL: 0.5 mg/dL (ref 0.3–1.2)
Indirect Bilirubin: 0.4 mg/dL (ref 0.3–0.9)
Total Protein: 6.3 g/dL — ABNORMAL LOW (ref 6.5–8.1)

## 2016-12-10 LAB — PROCALCITONIN: Procalcitonin: 0.86 ng/mL

## 2016-12-10 LAB — CBC
HEMATOCRIT: 28.5 % — AB (ref 39.0–52.0)
HEMOGLOBIN: 9 g/dL — AB (ref 13.0–17.0)
MCH: 28.1 pg (ref 26.0–34.0)
MCHC: 31.6 g/dL (ref 30.0–36.0)
MCV: 89.1 fL (ref 78.0–100.0)
Platelets: 460 10*3/uL — ABNORMAL HIGH (ref 150–400)
RBC: 3.2 MIL/uL — ABNORMAL LOW (ref 4.22–5.81)
RDW: 14.7 % (ref 11.5–15.5)
WBC: 7.4 10*3/uL (ref 4.0–10.5)

## 2016-12-10 LAB — BASIC METABOLIC PANEL
ANION GAP: 6 (ref 5–15)
BUN: <5 mg/dL — ABNORMAL LOW (ref 6–20)
CO2: 28 mmol/L (ref 22–32)
Calcium: 8.5 mg/dL — ABNORMAL LOW (ref 8.9–10.3)
Chloride: 105 mmol/L (ref 101–111)
Creatinine, Ser: 0.73 mg/dL (ref 0.61–1.24)
GFR calc Af Amer: >60 mL/min (ref >60)
GLUCOSE: 98 mg/dL (ref 65–99)
POTASSIUM: 3.5 mmol/L (ref 3.5–5.1)
Sodium: 139 mmol/L (ref 135–145)

## 2016-12-10 LAB — HEPATITIS PANEL, ACUTE
HCV Ab: <0.1 s/co ratio (ref 0.0–0.9)
HEP B S AG: NEGATIVE
Hep A IgM: NEGATIVE
Hep B C IgM: NEGATIVE

## 2016-12-10 MED ORDER — ENSURE ENLIVE PO LIQD: 237.0000 mL | Freq: Two times a day (BID) | ORAL | 12 refills | Status: DC

## 2016-12-10 NOTE — Discharge Instructions (Signed)
Community-Acquired Pneumonia, Adult °Introduction °Pneumonia is an infection of the lungs. One type of pneumonia can happen while a person is in a hospital. A different type can happen when a person is not in a hospital (community-acquired pneumonia). It is easy for this kind to spread from person to person. It can spread to you if you breathe near an infected person who coughs or sneezes. Some symptoms include: °· A dry cough. °· A wet (productive) cough. °· Fever. °· Sweating. °· Chest pain. °Follow these instructions at home: °· Take over-the-counter and prescription medicines only as told by your doctor. °¨ Only take cough medicine if you are losing sleep. °¨ If you were prescribed an antibiotic medicine, take it as told by your doctor. Do not stop taking the antibiotic even if you start to feel better. °· Sleep with your head and neck raised (elevated). You can do this by putting a few pillows under your head, or you can sleep in a recliner. °· Do not use tobacco products. These include cigarettes, chewing tobacco, and e-cigarettes. If you need help quitting, ask your doctor. °· Drink enough water to keep your pee (urine) clear or pale yellow. °A shot (vaccine) can help prevent pneumonia. Shots are often suggested for: °· People older than 73 years of age. °· People older than 73 years of age: °¨ Who are having cancer treatment. °¨ Who have long-term (chronic) lung disease. °¨ Who have problems with their body's defense system (immune system). °You may also prevent pneumonia if you take these actions: °· Get the flu (influenza) shot every year. °· Go to the dentist as often as told. °· Wash your hands often. If soap and water are not available, use hand sanitizer. °Contact a doctor if: °· You have a fever. °· You lose sleep because your cough medicine does not help. °Get help right away if: °· You are short of breath and it gets worse. °· You have more chest pain. °· Your sickness gets worse. This is very  serious if: °¨ You are an older adult. °¨ Your body's defense system is weak. °· You cough up blood. °This information is not intended to replace advice given to you by your health care provider. Make sure you discuss any questions you have with your health care provider. °Document Released: 05/05/2008 Document Revised: 04/24/2016 Document Reviewed: 03/14/2015 °© 2017 [employer]vier ° °

## 2016-12-10 NOTE — Progress Notes (Signed)
Patient discharge teaching given, including activity, diet, follow-up appoints, and medications. Patient verbalized understanding of all discharge instructions. IV access was d/c'd. Vitals are stable. Skin is intact except as charted in most recent assessments. Pt to be escorted out by NT, to be driven home by family. 

## 2016-12-10 NOTE — Progress Notes (Signed)
SATURATION QUALIFICATIONS: (This note is used to comply with regulatory documentation for home oxygen)  Patient Saturations on Room Air at Rest = 89%  Patient Saturations on Room Air while Ambulating = 88%  Patient Saturations on 2Liters of oxygen while Ambulating = 93%  Please briefly explain why patient needs home oxygen: 

## 2016-12-10 NOTE — Discharge Summary (Signed)
Physician Discharge Summary  SMIT TRIBLE [id number] DOB: 03-May-1946 DOA: 12/03/2016  PCP: Reginia Naas, MD  Admit date: 12/03/2016 Discharge date: 12/10/2016  Admitted From: Home Disposition: Home with home health  Recommendations for Outpatient Follow-up:  1. Follow up with PCP in 1-2 weeks. 2. Patient is being discharged on home O2 (2 L via nasal cannula continuous). If he persistently requires home O2 and remains hypoxic recommend outpatient 2-D echo to evaluate his heart function. 3. Please check LFTs during outpatient follow-up. [place] and follow-up chest x-ray in 4 weeks to ensure resolution of pneumonia. 4. Follow-up with GI Dr. Michail Sermon in 4 weeks.  Home Health: RN and PT Equipment/Devices: Home O2 (2 L via nasal cannula continuously)   Discharge Condition:Stable CODE STATUS: Full code Diet recommendation: Regular    Discharge Diagnoses:  Principal Problem:   Acute respiratory failure with hypoxia ([place])   Active Problems:   Sepsis ([place])   HCAP (healthcare-associated pneumonia)   History of cholecystectomy   Hypertension   Chronic pain   GERD (gastroesophageal reflux disease)   Transaminitis  Brief narrative/history of present illness Please refer to admission H&P for details, in brief, 71 year old male with history of anxiety, depression, GERD, hypertension hypothyroidism and, cell carcinoma of the esophagus presented with three-day history of right upper quadrant pain worsened with deep inspiration associated cough and shortness of breath. He also had generalized malaise. Patient had laproscopic cholecystectomy (converted to open) about 2 and half weeks prior to admission. He saw his pulmonologist the day before admission who prescribed Zithromax for bronchitis. Patient had multiple episodes of bilious vomiting over the past 1 day. EMS was called and was found to be hypoxic in the 80s. In the ED he was septic with bilateral lower lobe pneumonia and sepsis  pathway initiated, started on empiric antibiotic for healthcare associated pneumonia.  Patient showed clinical improvement but on 1/7 was again febrile with respiratory distress and became hypoxemic. CT angiogram of the chest was negative for PE but showed some fluid at the end of the esophagus.  Hospital course Sepsis with acute hypoxic respiratory failure secondary to healthcare associated pneumonia. Treated with empiric vancomycin and cefepime. Has received 8 days of antibiotics. Patient has been afebrile for past 48 hours. Respiratory viral panel growing coronavirus. CT angiogram of the chest was negative for PE but showed multifocal pneumonia and some fluid in the distal esophagus. A barium swallow was done given concern for aspiration but was negative for stricture and showed esophageal dysmotility and small sliding hiatal hernia. Sepsis has resolved. Patient requiring 2 L via nasal cannula continuously both at rest and on ambulation. Patient has already completed total 8 days of antibiotics since admission. -Symptoms are better. He can be discharged home with home health RN and PT. He will follow-up with his PCP in 1 week and if still hypoxic and requiring oxygen he may need a 2-D echo to evaluate his heart function.  Acute respiratory failure with hypoxia As outlined above. Discharged on 2 L via nasal cannula continuously.  Nausea and vomiting with recent cholecystectomy and transaminitis Patient had laproscopic cholecystectomy converted to open about 3 weeks back. Patient was seen by Dr. Barry Dienes during admission and unlikely abdominal complication from cholecystectomy. Nausea and vomiting has resolved. Abdominal pain has also resolved. He is tolerating diet and denies dysphagia. Ultrasound abdomen done was unremarkable. Hepatitis panel negative. Follow-up with surgery as scheduled.  Transaminitis Normal total bilirubin and alkaline phosphatase. Hepatitis panel and repeat ultrasound  abdomen unremarkable. GI  recommends outpatient follow-up on his LFTs. (Recommend follow-up with Dr. Michail Sermon in 2-4 weeks)  Hypophosphatemia/hypokalemia Repleated  Chronic pain Continue home oxycodone   GERD with hardly hernia Continue PPI  Essential hypertension Continue amlodipine.  Pertinent calorie malnutrition Added supplement  Generalized weakness Seen by PT and recommends home health.   CODE STATUS: Full code  Family communication: Wife at bedside    Consults: [facility]  Procedures CT angiogram of the chest Ultrasound abdomen      Discharge Instructions   Allergies as of 12/10/2016      Reactions   Doxycycline Nausea And Vomiting   Guaifenesin Nausea And Vomiting   Morphine Sulfate Other (See Comments)   Tapentadol Other (See Comments)      Medication List    STOP taking these medications   azithromycin 250 MG tablet Commonly known as:  ZITHROMAX     TAKE these medications   albuterol 108 (90 Base) MCG/ACT inhaler Commonly known as:  VENTOLIN HFA Inhale 2 puffs into the lungs every 6 (six) hours as needed.   amLODipine 10 MG tablet Commonly known as:  NORVASC Take 10 mg by mouth daily.   DULoxetine 30 MG capsule Commonly known as:  CYMBALTA Take 3 capsules by mouth daily.   feeding supplement (ENSURE ENLIVE) Liqd Take 237 mLs by mouth 2 (two) times daily between meals. Start taking on:  12/11/2016   levothyroxine 150 MCG tablet Commonly known as:  SYNTHROID, LEVOTHROID Take 150 mcg by mouth daily.   mometasone-formoterol 100-5 MCG/ACT Aero Commonly known as:  DULERA Take 2 puffs first thing in am and then another 2 puffs about 12 hours later.   nicotine polacrilex 4 MG gum Commonly known as:  NICORETTE Take 4 mg by mouth as needed for smoking cessation.   ondansetron 8 MG tablet Commonly known as:  ZOFRAN Take 8 mg by mouth every 8 (eight) hours as needed for nausea.   oxyCODONE 15 MG immediate release tablet Commonly known  as:  ROXICODONE Take 0.5 tablets (7.5 mg total) by mouth 4 (four) times daily.   ranitidine 150 MG capsule Commonly known as:  ZANTAC One at bedtime What changed:  how much to take  how to take this  when to take this  additional instructions   testosterone cypionate 200 MG/ML injection Commonly known as:  DEPOTESTOSTERONE CYPIONATE Inject 0.5 mLs into the muscle once a week.   ZOMETA IV Inject into the vein every 3 (three) months.            Durable Medical Equipment        Start     Ordered   12/10/16 1600  For home use only DME oxygen  Once    Comments:  Oxygen saturation 88% on room air both at rest and ambulation. Improves to >93% on 2 L via nasal cannula both at rest and ambulation. Needs continuous home O2 (2 L via nasal cannula).  Question Answer Comment  Mode or (Route) Nasal cannula   Liters per Minute 2   Frequency Continuous (stationary and portable oxygen unit needed)   Oxygen conserving device No   Oxygen delivery system Gas      12/10/16 [facility] Follow up.   Why:  Oxygen to be delivered to room prior to discharge Contact information: [address] [place] [address] [phone]        [facility] Follow up.  Why:  For home health care. They will contact you in 1-2 days after you get home to schedule your first home visit. Contact information: [place] [address] [phone]        Reginia Naas, MD. Schedule an appointment as soon as possible for a visit in 1 week(s).   Specialty:  Family Medicine Contact information: [place] [address] [phone]          Allergies  Allergen Reactions  . Doxycycline Nausea And Vomiting  . Guaifenesin Nausea And Vomiting  . Morphine Sulfate Other (See Comments)  . Tapentadol Other (See Comments)      Procedures/Studies: Ct Angio  Chest Pe W Or Wo Contrast  Result Date: 12/08/2016 CLINICAL DATA:  Difficulty breathing history of pneumonia EXAM: CT ANGIOGRAPHY CHEST WITH CONTRAST TECHNIQUE: Multidetector CT imaging of the chest was performed using the standard protocol during bolus administration of intravenous contrast. Multiplanar CT image reconstructions and MIPs were obtained to evaluate the vascular anatomy. CONTRAST:  100 mL Isovue 370 intravenous COMPARISON:  Chest x-ray 12/08/2016, CT chest 03/13/2016 FINDINGS: Cardiovascular: Satisfactory opacification of the pulmonary arteries to the segmental level. No evidence of pulmonary embolism. Heart size upper normal. Trace pericardial effusion. Ectatic ascending thoracic aorta, measuring up to 3.7 cm in maximum diameter. Atherosclerotic calcifications are present. No dissection is seen. Mediastinum/Nodes: Trachea within normal limits. Mild mediastinal adenopathy, a lymph node anterior to the left bronchus measures 11 mm. Subcarinal lymph node measures 2 cm. Mild fluid-filled esophagus. Small hiatal hernia. Lungs/Pleura: Small bilateral pleural effusions. Extensive consolidation and ground-glass density in the right lower lobe with moderate ground-glass density and consolidation in the left lower lobe. Scattered foci of ground-glass density in the lingula, posterior aspect of the right middle lobe, and the posterior aspect of the right upper lobe. Previously noted left lower lobe nodule is obscured. A small right apical lung nodule is grossly unchanged. A calcified nodule in the right cardio phrenic angle is unchanged. No pneumothorax Upper Abdomen: Calcified splenic granuloma. 4.1 cm exophytic cyst upper pole left kidney. Musculoskeletal: Multilevel degenerative changes. No acute or suspicious bone lesion. Review of the MIP images confirms the above findings. IMPRESSION: 1. No CT evidence for acute pulmonary embolus or aortic dissection. 2. Small bilateral pleural effusions with multifocal  consolidations and ground-glass densities compatible with multifocal pneumonia. There is mild mediastinal adenopathy which is probably reactive. 3. Mild fluid filled slightly distended esophagus which may be related to a small distal hiatal hernia, nonemergent barium swallow could be performed if there are symptoms suggestive of obstruction. 4. Trace pericardial effusion Electronically Signed   By: Donavan Foil M.D.   On: 12/08/2016 20:18   Dg Esophagus  Result Date: 12/09/2016 CLINICAL DATA:  Vomiting. Recurrent aspiration pneumonia. Hiatal hernia. EXAM: ESOPHOGRAM/BARIUM SWALLOW TECHNIQUE: Single contrast examination was performed using thin barium or water soluble. FLUOROSCOPY TIME:  Fluoroscopy Time:  48 seconds. Radiation Exposure Index (if provided by the fluoroscopic device): 4.6 mGy Number of Acquired Spot Images: 0 COMPARISON:  Chest CT from yesterday FINDINGS: Ill patient which had to be imaged semi recumbent in the LPO position. Oblique pharyngeal imaging showed no aspiration or obstructive process. No stricture noted within the esophagus. No detected mucosal lesion on this single contrast exam. Small sliding hiatal hernia. There is nonspecific esophageal dysmotility disorder evidenced by frequent tertiary contractions and intermittent stasis. The stasis may be exacerbated by patient positioning. No diverticula or dominant pain complaint to implicate diffuse  esophageal spasm. IMPRESSION: 1. Small sliding hiatal hernia. 2. Negative for stricture. 3. Nonspecific esophageal dysmotility with frequent stasis and tertiary contractions. Electronically Signed   By: Monte Fantasia M.D.   On: 12/09/2016 15:33   Dg Chest Port 1 View  Result Date: 12/08/2016 CLINICAL DATA:  Pneumonia, fever. EXAM: PORTABLE CHEST 1 VIEW COMPARISON:  Radiograph December 05, 2016. FINDINGS: Stable cardiomediastinal silhouette no pneumothorax is noted. Stable bibasilar opacities, right greater than left, concerning for pneumonia or  atelectasis. Bony thorax is unremarkable. IMPRESSION: Stable bibasilar opacities as described above. Electronically Signed   By: Marijo Conception, M.D.   On: 12/08/2016 07:53   Dg Chest Port 1 View  Result Date: 12/05/2016 CLINICAL DATA:  Follow-up pneumonia EXAM: PORTABLE CHEST 1 VIEW COMPARISON:  12/03/2016 FINDINGS: Cardiac shadow is mildly enlarged but stable. The lungs are well aerated bilaterally. Persistent right basilar infiltrate is seen. Increasing left basilar infiltrate is noted as well. No other focal abnormality is seen. No bony abnormality is noted. IMPRESSION: Stable right basilar infiltrates. Increasing left basilar infiltrate. Electronically Signed   By: Inez Catalina M.D.   On: 12/05/2016 07:50   Dg Chest Port 1 View  Result Date: 12/03/2016 CLINICAL DATA:  Sudden onset shortness of breath yesterday. EXAM: PORTABLE CHEST 1 VIEW COMPARISON:  10/30/2016 FINDINGS: Consolidation noted in both lower lobes, right greater than left compatible with multifocal pneumonia. Suspect small right effusion. Heart is normal size. IMPRESSION: Bilateral lower lobe airspace consolidation, right greater than left compatible with multifocal pneumonia. Suspect small right effusion. Electronically Signed   By: Rolm Baptise M.D.   On: 12/03/2016 10:57   US Abdomen Limited Ruq  Result Date: 12/10/2016 CLINICAL DATA:  Abdominal pain after cholecystectomy in November of 2017. EXAM: US ABDOMEN LIMITED - RIGHT UPPER QUADRANT COMPARISON:  Ultrasound abdomen 10/30/2016. CT abdomen and pelvis 10/30/2016. FINDINGS: Gallbladder: Gallbladder is surgically absent. No fluid collection in the gallbladder fossa. Common bile duct: Diameter: 7 mm, normal Liver: No focal lesion identified. Within normal limits in parenchymal echogenicity. IMPRESSION: Surgical absence of the gallbladder. No bile duct dilatation. No fluid collection demonstrated in the gallbladder fossa. Electronically Signed   By: Lucienne Capers M.D.   On:  12/10/2016 00:07       Subjective: Feels better overall. No nausea or vomiting. Tolerating diet.  Discharge Exam: Vitals:   12/10/16 0953 12/10/16 1350  BP: 139/82 (!) 145/82  Pulse:  93  Resp:  20  Temp:  99.1 F (37.3 C)   Vitals:   12/10/16 0507 12/10/16 0953 12/10/16 1350 12/10/16 1409  BP: (!) 151/79 139/82 (!) 145/82   Pulse: 84  93   Resp: 20  20   Temp: 98.9 F (37.2 C)  99.1 F (37.3 C)   TempSrc: Oral  Oral   SpO2: 95%  92% 95%  Weight:      Height:        General: Elderly male appears fatigued, not in distress HEENT: Moist mucosa, supple neck Chest: Few scattered rhonchi, no wheezing or crackles CVS: RRR, S1/S2 +, no rubs, no gallops GI: Soft, NT, ND, bowel sounds +, abdominal surgical site appears clean Musculoskeletal: Warm, no edema CNS: Alert and oriented    The results of significant diagnostics from this hospitalization (including imaging, microbiology, ancillary and laboratory) are listed below for reference.     Microbiology: Recent Results (from the past 240 hour(s))  Culture, blood (routine x 2)     Status: None   Collection Time: 12/03/16  12:40 PM  Result Value Ref Range Status   Specimen Description BLOOD RIGHT ANTECUBITAL  Final   Special Requests BOTTLES DRAWN AEROBIC AND ANAEROBIC  5CC  Final   Culture NO GROWTH 5 DAYS  Final   Report Status 12/08/2016 FINAL  Final  Culture, blood (routine x 2)     Status: None   Collection Time: 12/03/16 12:49 PM  Result Value Ref Range Status   Specimen Description BLOOD LEFT HAND  Final   Special Requests IN PEDIATRIC BOTTLE  2CC  Final   Culture NO GROWTH 5 DAYS  Final   Report Status 12/08/2016 FINAL  Final  Urine culture     Status: Abnormal   Collection Time: 12/03/16  7:39 PM  Result Value Ref Range Status   Specimen Description URINE, CLEAN CATCH  Final   Special Requests NONE  Final   Culture MULTIPLE SPECIES PRESENT, SUGGEST RECOLLECTION (A)  Final   Report Status 12/04/2016 FINAL   Final  Respiratory Panel by PCR     Status: Abnormal   Collection Time: 12/04/16 12:04 AM  Result Value Ref Range Status   Adenovirus NOT DETECTED NOT DETECTED Final   Coronavirus 229E NOT DETECTED NOT DETECTED Final   Coronavirus HKU1 NOT DETECTED NOT DETECTED Final   Coronavirus NL63 DETECTED (A) NOT DETECTED Final   Coronavirus OC43 NOT DETECTED NOT DETECTED Final   Metapneumovirus NOT DETECTED NOT DETECTED Final   Rhinovirus / Enterovirus NOT DETECTED NOT DETECTED Final   Influenza A NOT DETECTED NOT DETECTED Final   Influenza B NOT DETECTED NOT DETECTED Final   Parainfluenza Virus 1 NOT DETECTED NOT DETECTED Final   Parainfluenza Virus 2 NOT DETECTED NOT DETECTED Final   Parainfluenza Virus 3 NOT DETECTED NOT DETECTED Final   Parainfluenza Virus 4 NOT DETECTED NOT DETECTED Final   Respiratory Syncytial Virus NOT DETECTED NOT DETECTED Final   Bordetella pertussis NOT DETECTED NOT DETECTED Final   Chlamydophila pneumoniae NOT DETECTED NOT DETECTED Final   Mycoplasma pneumoniae NOT DETECTED NOT DETECTED Final  MRSA PCR Screening     Status: None   Collection Time: 12/04/16  5:37 PM  Result Value Ref Range Status   MRSA by PCR NEGATIVE NEGATIVE Final    Comment:        The GeneXpert MRSA Assay (FDA approved for NASAL specimens only), is one component of a comprehensive MRSA colonization surveillance program. It is not intended to diagnose MRSA infection nor to guide or monitor treatment for MRSA infections.   Culture, sputum-assessment     Status: None   Collection Time: 12/04/16  8:55 PM  Result Value Ref Range Status   Specimen Description EXPECTORATED SPUTUM  Final   Special Requests NONE  Final   Sputum evaluation THIS SPECIMEN IS ACCEPTABLE FOR SPUTUM CULTURE  Final   Report Status 12/05/2016 FINAL  Final  Culture, respiratory (NON-Expectorated)     Status: None   Collection Time: 12/04/16  8:55 PM  Result Value Ref Range Status   Specimen Description EXPECTORATED  SPUTUM  Final   Special Requests NONE Reflexed from [id number]  Final   Gram Stain   Final    ABUNDANT WBC PRESENT,BOTH PMN AND MONONUCLEAR NO ORGANISMS SEEN    Culture Consistent with normal respiratory flora.  Final   Report Status 12/07/2016 FINAL  Final     Labs: BNP (last 3 results) No results for input(s): BNP in the last 8760 hours. Basic Metabolic Panel:  Recent Labs Lab 12/05/16 1223 12/06/16 0334  12/07/16 0519 12/08/16 0835 12/09/16 0723 12/10/16 0912  NA  --  134* 133* 136 135 139  K  --  3.4* 3.4* 3.2* 3.0* 3.5  CL  --  102 99* 101 102 105  CO2  --  25 24 26 26 28   GLUCOSE  --  95 94 98 110* 98  BUN  --  6 <5* <5* <5* <5*  CREATININE  --  0.75 0.71 0.77 0.69 0.73  CALCIUM  --  8.0* 8.2* 7.8* 7.9* 8.5*  MG 1.8  --   --   --   --   --   PHOS  --  1.1*  --   --   --   --    Liver Function Tests:  Recent Labs Lab 12/10/16 0912  AST 135*  ALT 126*  ALKPHOS 28*  BILITOT 0.5  PROT 6.3*  ALBUMIN 2.4*   No results for input(s): LIPASE, AMYLASE in the last 168 hours. No results for input(s): AMMONIA in the last 168 hours. CBC:  Recent Labs Lab 12/06/16 0334 12/07/16 0519 12/08/16 0835 12/09/16 0723 12/10/16 0912  WBC 10.0 12.1* 11.0* 8.2 7.4  HGB 8.6* 9.1* 8.9* 8.1* 9.0*  HCT 26.5* 28.0* 27.7* 25.2* 28.5*  MCV 88.3 87.8 87.4 87.8 89.1  PLT 254 285 314 354 460*   Cardiac Enzymes:  Recent Labs Lab 12/03/16 1809  TROPONINI <0.03   BNP: Invalid input(s): POCBNP CBG: No results for input(s): GLUCAP in the last 168 hours. D-Dimer No results for input(s): DDIMER in the last 72 hours. Hgb A1c No results for input(s): HGBA1C in the last 72 hours. Lipid Profile No results for input(s): CHOL, HDL, LDLCALC, TRIG, CHOLHDL, LDLDIRECT in the last 72 hours. Thyroid function studies No results for input(s): TSH, T4TOTAL, T3FREE, THYROIDAB in the last 72 hours.  Invalid input(s): FREET3 Anemia work up No results for input(s): VITAMINB12, FOLATE,  FERRITIN, TIBC, IRON, RETICCTPCT in the last 72 hours. Urinalysis    Component Value Date/Time   COLORURINE AMBER (A) 12/03/2016 1938   APPEARANCEUR CLEAR 12/03/2016 1938   LABSPEC 1.027 12/03/2016 1938   PHURINE 5.0 12/03/2016 1938   GLUCOSEU NEGATIVE 12/03/2016 1938   HGBUR NEGATIVE 12/03/2016 1938   BILIRUBINUR SMALL (A) 12/03/2016 1938   KETONESUR 5 (A) 12/03/2016 1938   PROTEINUR 30 (A) 12/03/2016 1938   UROBILINOGEN 0.2 01/15/2013 1043   NITRITE NEGATIVE 12/03/2016 1938   LEUKOCYTESUR NEGATIVE 12/03/2016 1938   Sepsis Labs Invalid input(s): PROCALCITONIN,  WBC,  LACTICIDVEN Microbiology Recent Results (from the past 240 hour(s))  Culture, blood (routine x 2)     Status: None   Collection Time: 12/03/16 12:40 PM  Result Value Ref Range Status   Specimen Description BLOOD RIGHT ANTECUBITAL  Final   Special Requests BOTTLES DRAWN AEROBIC AND ANAEROBIC  5CC  Final   Culture NO GROWTH 5 DAYS  Final   Report Status 12/08/2016 FINAL  Final  Culture, blood (routine x 2)     Status: None   Collection Time: 12/03/16 12:49 PM  Result Value Ref Range Status   Specimen Description BLOOD LEFT HAND  Final   Special Requests IN PEDIATRIC BOTTLE  [facility]  Final   Culture NO GROWTH 5 DAYS  Final   Report Status 12/08/2016 FINAL  Final  Urine culture     Status: Abnormal   Collection Time: 12/03/16  7:39 PM  Result Value Ref Range Status   Specimen Description URINE, CLEAN CATCH  Final   Special Requests NONE  Final   Culture MULTIPLE SPECIES PRESENT, SUGGEST RECOLLECTION (A)  Final   Report Status 12/04/2016 FINAL  Final  Respiratory Panel by PCR     Status: Abnormal   Collection Time: 12/04/16 12:04 AM  Result Value Ref Range Status   Adenovirus NOT DETECTED NOT DETECTED Final   Coronavirus 229E NOT DETECTED NOT DETECTED Final   Coronavirus HKU1 NOT DETECTED NOT DETECTED Final   Coronavirus NL63 DETECTED (A) NOT DETECTED Final   Coronavirus OC43 NOT DETECTED NOT DETECTED Final    Metapneumovirus NOT DETECTED NOT DETECTED Final   Rhinovirus / Enterovirus NOT DETECTED NOT DETECTED Final   Influenza A NOT DETECTED NOT DETECTED Final   Influenza B NOT DETECTED NOT DETECTED Final   Parainfluenza Virus 1 NOT DETECTED NOT DETECTED Final   Parainfluenza Virus 2 NOT DETECTED NOT DETECTED Final   Parainfluenza Virus 3 NOT DETECTED NOT DETECTED Final   Parainfluenza Virus 4 NOT DETECTED NOT DETECTED Final   Respiratory Syncytial Virus NOT DETECTED NOT DETECTED Final   Bordetella pertussis NOT DETECTED NOT DETECTED Final   Chlamydophila pneumoniae NOT DETECTED NOT DETECTED Final   Mycoplasma pneumoniae NOT DETECTED NOT DETECTED Final  MRSA PCR Screening     Status: None   Collection Time: 12/04/16  5:37 PM  Result Value Ref Range Status   MRSA by PCR NEGATIVE NEGATIVE Final    Comment:        The GeneXpert MRSA Assay (FDA approved for NASAL specimens only), is one component of a comprehensive MRSA colonization surveillance program. It is not intended to diagnose MRSA infection nor to guide or monitor treatment for MRSA infections.   Culture, sputum-assessment     Status: None   Collection Time: 12/04/16  8:55 PM  Result Value Ref Range Status   Specimen Description EXPECTORATED SPUTUM  Final   Special Requests NONE  Final   Sputum evaluation THIS SPECIMEN IS ACCEPTABLE FOR SPUTUM CULTURE  Final   Report Status 12/05/2016 FINAL  Final  Culture, respiratory (NON-Expectorated)     Status: None   Collection Time: 12/04/16  8:55 PM  Result Value Ref Range Status   Specimen Description EXPECTORATED SPUTUM  Final   Special Requests NONE Reflexed from [id number]  Final   Gram Stain   Final    ABUNDANT WBC PRESENT,BOTH PMN AND MONONUCLEAR NO ORGANISMS SEEN    Culture Consistent with normal respiratory flora.  Final   Report Status 12/07/2016 FINAL  Final     Time coordinating discharge: Over 30 minutes  SIGNED:   Louellen Molder, MD  [facility] 12/10/2016, 4:02 PM Pager   If 7PM-7AM, please contact night-coverage [email or web address] Password [email or web address]

## 2016-12-10 NOTE — Progress Notes (Signed)
PT Cancellation Note  Patient Details Name: [name] MRN: [id number] DOB: 1946/07/03   Cancelled Treatment:    Reason Eval/Treat Not Completed: Other (comment) (Pt ready to d/c home refused stair training and reports he has already ambulated with nursing.  )   Cristela Blue 12/10/2016, 4:10 PM Governor Rooks, PTA pager [phone]

## 2016-12-10 NOTE — Progress Notes (Signed)
[facility] Progress Note  KADRIEN VANORDEN 71 y.o. 1945/12/25  CC:  Rule out esophageal stricture. Abnormal LFTs.   Subjective: Patient feeling better. Nausea and vomiting has resolved. Having 2-3 loose stools now. Abdominal pain is also improving. Tolerating diet. Denied any dysphagia.  ROS : Negative for nausea and vomiting. Negative for dysphagia   Objective: Vital signs in last 24 hours: Vitals:   12/10/16 0507 12/10/16 0953  BP: (!) 151/79 139/82  Pulse: 84   Resp: 20   Temp: 98.9 F (37.2 C)     Physical Exam:  General:   Alert,  Well-developed, well-nourished, pleasant and cooperative in NAD Head: NS, AT. Oxygen via nasal cannula  Eyes: EOMI. anicteric sclera Oral mucosa moist. No oral lesion. Lungs:  Coarse breath sounds with significant crackles and rhonchi bilaterally Heart:  Regular rate and rhythm; no murmurs, clicks, rubs,  or gallops. Abdomen: Soft, nontender, mild distended, bowel sounds present. No peritoneal signs. LE : no edema  Psych : Alert oriented 3. Mood and affect normal.   Lab Results:  Recent Labs  12/09/16 0723 12/10/16 0912  NA 135 139  K 3.0* 3.5  CL 102 105  CO2 26 28  GLUCOSE 110* 98  BUN <5* <5*  CREATININE 0.69 0.73  CALCIUM 7.9* 8.5*    Recent Labs  12/10/16 0912  AST 135*  ALT 126*  ALKPHOS 28*  BILITOT 0.5  PROT 6.3*  ALBUMIN 2.4*    Recent Labs  12/09/16 0723 12/10/16 0912  WBC 8.2 7.4  HGB 8.1* 9.0*  HCT 25.2* 28.5*  MCV 87.8 89.1  PLT 354 460*   No results for input(s): LABPROT, INR in the last 72 hours.    Assessment/Plan: - Abnormal CT scan showing mild fluid-filled slightly distended esophagus. Negative follow-up barium swallow for any stricture.  - Abnormal LFTs. Hepatocellular pattern. Repeat ultrasound unremarkable. Hepatitis panel negative. Liver biopsy 10/31/2016 showed calcified fibrotic nodule. - Nausea without any vomiting. Resolved - Hypoxic respiratory failure. - Sepsis with  healthcare acquired pneumonia. - On and off right upper quadrant pain since cholecystectomy. Improving - Status post laparoscopic converted to open subtotal cholecystectomy and lysis of adhesions as well as well wedge biopsy of the liver lesion on December 1 by Dr. Donne Hazel. - Biopsy negative for malignancy. Showed calcified nodule.   Recommendations ------------------------- - Patient's GI symptoms are improving. Nausea vomiting resolved. - Ultrasound unremarkable for any acute pathology. Hepatitis panel negative. Normal alkaline phosphatase and bilirubin. - Recommend outpatient workup for abnormal LFTs. - GI will sign off. Call us back if needed - Follow-up with Dr. Michail Sermon in 2-4 weeks after discharge.  Otis Brace MD, [place] 12/10/2016, 11:32 AM  Pager [phone]  If no answer or after 5 PM call [phone]

## 2016-12-11 ENCOUNTER — Telehealth: Payer: Self-pay | Admitting: Pulmonary Disease

## 2016-12-11 NOTE — Telephone Encounter (Signed)
Spoke with pt's wife and advised of appt time with [name] tomorrow at 8:45.  She verbalized understanding.  Nothing further needed.

## 2016-12-11 NOTE — Telephone Encounter (Signed)
Spoke with pt's wife. Pt was discharged from hospital after a week long stay with respiratory issues with pneumonia.  She states that pt continues to run a low grade temp (99) and had lots of chest congestion and coughing.  They are afraid that pt is relapsing and would like to be seen or other recommendations.  No available appts with [name] of [facility] today or tomorrow.  Please advise.

## 2016-12-11 NOTE — Telephone Encounter (Signed)
I could see him tomorrow at 8:45 am if you open up a spot. Otherwise, he should go to his PCP or an Urgent Care/Emergency Department to be seen if he is significantly concerned. Please let me know his decision. Thanks.

## 2016-12-12 ENCOUNTER — Encounter: Payer: Self-pay | Admitting: Pulmonary Disease

## 2016-12-12 ENCOUNTER — Ambulatory Visit (INDEPENDENT_AMBULATORY_CARE_PROVIDER_SITE_OTHER): Payer: Medicare Other | Admitting: Pulmonary Disease

## 2016-12-12 VITALS — BP 140/76 | HR 84 | Temp 98.2°F | Ht 75.0 in | Wt 193.8 lb

## 2016-12-12 DIAGNOSIS — R0600 Dyspnea, unspecified: Secondary | ICD-10-CM

## 2016-12-12 NOTE — Progress Notes (Signed)
Subjective:    Patient ID: [name], male    DOB: 1946/11/12, 71 y.o.   MRN: [id number]  C.C.:  Follow-up for Bilateral Lung Nodules, COPD, & GERD.  HPI Bilateral Lung Nodules:  Patient referred to me for lung nodules on his low-dose Chest CT imaging.   COPD:  Currently on Dulera. He has been on [facility] for years. Previously was on Advair. He reports he rarely if ever has to use his rescue inhaler. He denies any coughing and only rare wheezing.  He reports he gets bronchitis 1-2 times per year but has been less frequent in the last few years.   GERD:  He reports he takes Zantac nightly. Sometimes he does take a second dose in the daytime. He reports lately he has been waking up with morning brash water taste. Previously was on Protonix.   Review of Systems No chest pain or pressure. Rare chest tightness. No feer, chills, or sweats. No headaches or near syncope.   Allergies  Allergen Reactions  . Doxycycline Nausea And Vomiting  . Guaifenesin Nausea And Vomiting  . Morphine Sulfate Other (See Comments)  . Tapentadol Other (See Comments)    Current Outpatient Prescriptions on File Prior to Visit  Medication Sig Dispense Refill  . albuterol (VENTOLIN HFA) 108 (90 BASE) MCG/ACT inhaler Inhale 2 puffs into the lungs every 6 (six) hours as needed. 1 Inhaler 3  . amLODipine (NORVASC) 10 MG tablet Take 10 mg by mouth daily.  0  . DULoxetine (CYMBALTA) 30 MG capsule Take 3 capsules by mouth daily.    [name] Kitchen levothyroxine (SYNTHROID, LEVOTHROID) 150 MCG tablet Take 150 mcg by mouth daily.      . mometasone-formoterol (DULERA) 100-5 MCG/ACT AERO Take 2 puffs first thing in am and then another 2 puffs about 12 hours later. 3 Inhaler 1  . nicotine polacrilex (NICORETTE) 4 MG gum Take 4 mg by mouth as needed for smoking cessation.    . ondansetron (ZOFRAN) 8 MG tablet Take 8 mg by mouth every 8 (eight) hours as needed for nausea.  0  . oxyCODONE (ROXICODONE) 15 MG immediate release tablet Take  0.5 tablets (7.5 mg total) by mouth 4 (four) times daily. 15 tablet 0  . ranitidine (ZANTAC) 150 MG capsule One at bedtime (Patient taking differently: Take 150 mg by mouth every evening. One at bedtime) 30 capsule 11  . testosterone cypionate (DEPOTESTOTERONE CYPIONATE) 200 MG/ML injection Inject 0.5 mLs into the muscle once a week.    . Zoledronic Acid (ZOMETA IV) Inject into the vein every 3 (three) months.     No current facility-administered medications on file prior to visit.     Past Medical History:  Diagnosis Date  . Anxiety state, unspecified   . Arthritis    osteo arthritis  . Chronic airway obstruction, not elsewhere classified   . Depressive disorder, not elsewhere classified   . GERD (gastroesophageal reflux disease)   . Hoarseness of voice   . Hypertension   . Hypothyroidism   . Osteoporosis   . Reflux gastritis   . S/P radiation therapy 06/14/2015 through 07/30/2015    Glottic larynx 6600 cGy in 33 sessions   . 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 Tobacco chew use     Past Surgical History:  Procedure Laterality Date  . ABDOMINAL SURGERY  1971   terminal ileum removed, ileitis   . Biospy of the Right True Vocal Cord Right 04/23/2015  . CHOLECYSTECTOMY  N/A 10/31/2016   Procedure: OPEN CHOLECYSTECTOMY;  Surgeon: Rolm Bookbinder, MD;  Location: [facility];  Service: General;  Laterality: N/A;  . MICROLARYNGOSCOPY Right 04/23/2015   Procedure: MICROLARYNGOSCOPY WITH BIOPSY RIGHT TRUE VOCAL CORD;  Surgeon: Izora Gala, MD;  Location: [facility];  Service: ENT;  Laterality: Right;  . SHOULDER SURGERY    . TONSILLECTOMY     as child    Family History  Problem Relation Age of Onset  . Prostate cancer Father   . Lung cancer Father   . Allergies Sister     Social History   Social History  . Marital status: Married    Spouse name: N/A  . Number of  children: N/A  . Years of education: N/A   Social History Main Topics  . Smoking status: Former Smoker    Packs/day: 2.00    Years: 43.00    Types: Cigarettes    Quit date: 12/01/2001  . Smokeless tobacco: Never Used     Comment: 2ppd x 43 years.  Still uses nicotine gum. Quit smoking 13 years ago  . Alcohol use No  . Drug use: No  . Sexual activity: Not Asked   Other Topics Concern  . None   Social History Narrative   Originally from [place]. Moved to  with job relocation in 1996. Married. Previously also lived in [place]. No international travel. 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 Currently has a dog. No bird exposure. No mold exposure. No hot tub exposure. Does wood working with domestic woods.       Objective:   Physical Exam BP 140/76 (BP Location: Right Arm, Cuff Size: Normal)   Pulse 84   Temp 98.2 F (36.8 C) (Oral)   Ht 6\' 3"  (1.905 m)   Wt 193 lb 12.8 oz (87.9 kg)   SpO2 94%   BMI 24.22 kg/m  General:  Awake. Alert. No acute distress. Sitting watching TV. Family at bedside.  Integument:  Warm & dry. No rash on exposed skin. No bruising. HEENT:  Moist mucus membranes. No oral ulcers. No scleral injection or icterus. Endotracheal tube in place. PERRL. Cardiovascular:  Regular rate. No edema. No appreciable JVD.  Pulmonary:  Good aeration & clear to auscultation bilaterally. Symmetric chest wall expansion. No accessory muscle use. Abdomen: Soft. Normal bowel sounds. Nondistended. Grossly nontender. Musculoskeletal:  Normal bulk and tone. Hand grip strength 5/5 bilaterally. No joint deformity or effusion appreciated.  PFT 09/08/14: FVC 4.85 L (98%) FEV1 3.46 L (94%) FEV1/FVC 0.71 FEF 25-75 2.30 L (83%) negative bronchodilator response  IMAGING LD CT CHEST W/O 03/13/16 (personally reviewed by me): Multiple pulmonary nodules bilaterally unchanged in size. Largest nodule 1.3 cm posterior aspect left lower lobe. Nodule reportedly stable going  back to 03/28/05. Calcified nodules noted. Mild diffuse bronchial wall thickening & mild centrilobular/paraseptal emphysema. No pleural effusion. Calcified granulomas noted in the liver and spleen. No pleural thickening appreciated either. No pathologic mediastinal adenopathy. Recommended repeat CT imaging in 12 months. LLL lung nodule is indeed stable going back to 2006 on my review.   LD CHEST CT W/O 08/22/15 (per radiologist): Multiple pulmonary nodules bilaterally with largest in the posterior left lower lobe measuring 1.3 cm & stable compared to recent prior exam from 08/15/14. Nodule reportedly present and virtually unchanged compared with studies going back to 2007. Largest noncalcified pulmonary nodule in the medial aspect left lower lobe measuring 6 mm. Calcified nodules noted. Cylindrical) varicose bronchiectasis with  thickening of. Bronchovascular interstitium and extensive peri-bronchovascular micro-nodularities with architectural distortion in the inferior segment of the lingula. Mild diffuse bronchial wall thickening and mild centrilobular and paraseptal emphysema. No pleural effusion. Recommended for short-term follow-up with imaging in 6 months.  LABS 10/22/10 IgE:  <1.5    Assessment & Plan:  71 y.o. male with Underlying COPD and bilateral lung nodules. Patient showed up late for his appointment today and refused to wait to be worked in to be seen.   Sonia Baller Ashok Cordia, M.D. [facility] Pager:  [phone] After 3pm or if no response, call [phone] 10:27 AM 12/12/16

## 2016-12-15 DIAGNOSIS — R918 Other nonspecific abnormal finding of lung field: Secondary | ICD-10-CM | POA: Diagnosis not present

## 2016-12-15 DIAGNOSIS — Z434 Encounter for attention to other artificial openings of digestive tract: Secondary | ICD-10-CM | POA: Diagnosis not present

## 2016-12-15 DIAGNOSIS — M150 Primary generalized (osteo)arthritis: Secondary | ICD-10-CM | POA: Diagnosis not present

## 2016-12-15 DIAGNOSIS — Z48815 Encounter for surgical aftercare following surgery on the digestive system: Secondary | ICD-10-CM | POA: Diagnosis not present

## 2016-12-15 DIAGNOSIS — J449 Chronic obstructive pulmonary disease, unspecified: Secondary | ICD-10-CM | POA: Diagnosis not present

## 2016-12-15 DIAGNOSIS — G894 Chronic pain syndrome: Secondary | ICD-10-CM | POA: Diagnosis not present

## 2016-12-15 DIAGNOSIS — D638 Anemia in other chronic diseases classified elsewhere: Secondary | ICD-10-CM | POA: Diagnosis not present

## 2016-12-15 DIAGNOSIS — Z79891 Long term (current) use of opiate analgesic: Secondary | ICD-10-CM | POA: Diagnosis not present

## 2016-12-16 DIAGNOSIS — R740 Nonspecific elevation of levels of transaminase and lactic acid dehydrogenase [LDH]: Secondary | ICD-10-CM | POA: Diagnosis not present

## 2016-12-16 DIAGNOSIS — R0902 Hypoxemia: Secondary | ICD-10-CM | POA: Diagnosis not present

## 2016-12-16 DIAGNOSIS — J9601 Acute respiratory failure with hypoxia: Secondary | ICD-10-CM | POA: Diagnosis not present

## 2016-12-16 DIAGNOSIS — J189 Pneumonia, unspecified organism: Secondary | ICD-10-CM | POA: Diagnosis not present

## 2016-12-19 DIAGNOSIS — R918 Other nonspecific abnormal finding of lung field: Secondary | ICD-10-CM | POA: Diagnosis not present

## 2016-12-19 DIAGNOSIS — J449 Chronic obstructive pulmonary disease, unspecified: Secondary | ICD-10-CM | POA: Diagnosis not present

## 2016-12-19 DIAGNOSIS — D638 Anemia in other chronic diseases classified elsewhere: Secondary | ICD-10-CM | POA: Diagnosis not present

## 2016-12-19 DIAGNOSIS — M150 Primary generalized (osteo)arthritis: Secondary | ICD-10-CM | POA: Diagnosis not present

## 2016-12-19 DIAGNOSIS — Z434 Encounter for attention to other artificial openings of digestive tract: Secondary | ICD-10-CM | POA: Diagnosis not present

## 2016-12-19 DIAGNOSIS — Z48815 Encounter for surgical aftercare following surgery on the digestive system: Secondary | ICD-10-CM | POA: Diagnosis not present

## 2016-12-22 DIAGNOSIS — R918 Other nonspecific abnormal finding of lung field: Secondary | ICD-10-CM | POA: Diagnosis not present

## 2016-12-22 DIAGNOSIS — Z48815 Encounter for surgical aftercare following surgery on the digestive system: Secondary | ICD-10-CM | POA: Diagnosis not present

## 2016-12-22 DIAGNOSIS — Z434 Encounter for attention to other artificial openings of digestive tract: Secondary | ICD-10-CM | POA: Diagnosis not present

## 2016-12-22 DIAGNOSIS — M150 Primary generalized (osteo)arthritis: Secondary | ICD-10-CM | POA: Diagnosis not present

## 2016-12-22 DIAGNOSIS — D638 Anemia in other chronic diseases classified elsewhere: Secondary | ICD-10-CM | POA: Diagnosis not present

## 2016-12-22 DIAGNOSIS — J449 Chronic obstructive pulmonary disease, unspecified: Secondary | ICD-10-CM | POA: Diagnosis not present

## 2016-12-23 ENCOUNTER — Ambulatory Visit: Payer: Medicare Other | Admitting: Adult Health

## 2016-12-25 ENCOUNTER — Ambulatory Visit: Payer: Medicare Other | Admitting: Adult Health

## 2016-12-25 ENCOUNTER — Encounter: Payer: Self-pay | Admitting: Pulmonary Disease

## 2016-12-25 ENCOUNTER — Ambulatory Visit (INDEPENDENT_AMBULATORY_CARE_PROVIDER_SITE_OTHER): Payer: Medicare Other | Admitting: Pulmonary Disease

## 2016-12-25 ENCOUNTER — Ambulatory Visit (INDEPENDENT_AMBULATORY_CARE_PROVIDER_SITE_OTHER)
Admission: RE | Admit: 2016-12-25 | Discharge: 2016-12-25 | Disposition: A | Discharge status: Home / Self Care | Payer: Medicare Other | Source: Ambulatory Visit | Attending: Pulmonary Disease | Admitting: Pulmonary Disease

## 2016-12-25 DIAGNOSIS — J9601 Acute respiratory failure with hypoxia: Secondary | ICD-10-CM

## 2016-12-25 DIAGNOSIS — J189 Pneumonia, unspecified organism: Secondary | ICD-10-CM

## 2016-12-25 DIAGNOSIS — J449 Chronic obstructive pulmonary disease, unspecified: Secondary | ICD-10-CM

## 2016-12-25 NOTE — Patient Instructions (Signed)
Chest x-ray today We will send an order to DME to discontinue oxygen Okay to stop Avelox after today

## 2016-12-25 NOTE — Progress Notes (Signed)
   Subjective:    Patient ID: [name], male    DOB: Apr 10, 1946, 71 y.o.   MRN: [id number]  HPI  71 y.o. Ex-smoker with gold 0 COPD and bilateral lung nodules.  12/25/2016  Chief Complaint  Patient presents with  . Pneumonia    Has had pneumonia for the past month. PCP and Home nurse recommended an OV.    He underwent open cholecystectomy in December 2017. Following discharge, he downward bilious vomiting and was admitted to the hospital with right lower lobe pneumonia from 12/03/16-12/10/16. He was treated with broad-spectrum antibiotics, CT angiogram showed the pneumonia and fluid filled esophagus. Barium swallow did not show any evidence of aspiration or esophageal stricture, showed a small sliding hiatal hernia. He was evaluated by gastroenterology.  He was discharged on oxygen. He feels much improved today and is able to walk short distances without significant dyspnea. He has not used his oxygen for one week and wonders if he still needs it. He is on his last day of Avelox and is now bringing up small amounts of clear sputum. He denies any problems swallowing, no vomiting or abdominal pain  Oxygen saturation was 93% at rest and dropped to 90% on ambulation 3 laps around the office at moderate pace.  Significant tests/ events PFT 09/08/14:FVC 4.85 L (98%) FEV1 3.46 L (94%) FEV1/FVC 0.71 FEF 25-75 2.30 L (83%) negative bronchodilator response   Past Medical History:  Diagnosis Date  . Anxiety state, unspecified   . Arthritis    osteo arthritis  . Chronic airway obstruction, not elsewhere classified   . Depressive disorder, not elsewhere classified   . GERD (gastroesophageal reflux disease)   . Hoarseness of voice   . Hypertension   . Hypothyroidism   . Osteoporosis   . Reflux gastritis   . S/P radiation therapy 06/14/2015 through 07/30/2015    Glottic larynx 6600 cGy in 33 sessions   . Squamous  cell carcinoma of right vocal cord ([place]) 04/23/2015  . Tobacco chew use      Review of Systems neg for any significant sore throat, dysphagia, itching, sneezing, nasal congestion or excess/ purulent secretions, fever, chills, sweats, unintended wt loss, pleuritic or exertional cp, hempoptysis, orthopnea pnd or change in chronic leg swelling. Also denies presyncope, palpitations, heartburn, abdominal pain, nausea, vomiting, diarrhea or change in bowel or urinary habits, dysuria,hematuria, rash, arthralgias, visual complaints, headache, numbness weakness or ataxia.     Objective:   Physical Exam   Gen. Pleasant, well-nourished, in no distress ENT - no lesions, no post nasal drip Neck: No JVD, no thyromegaly, no carotid bruits Lungs: no use of accessory muscles, no dullness to percussion, RLLrales , no rhonchi  Cardiovascular: Rhythm regular, heart sounds  normal, no murmurs or gallops, no peripheral edema Musculoskeletal: No deformities, no cyanosis or clubbing         Assessment & Plan:

## 2016-12-25 NOTE — Assessment & Plan Note (Signed)
-  Resolved  We will send an order to DME to discontinue oxygen

## 2016-12-25 NOTE — Assessment & Plan Note (Signed)
Ct dulera

## 2016-12-25 NOTE — Assessment & Plan Note (Signed)
Appears to have improved/resolved Chest x-ray today Okay to stop Avelox after today

## 2016-12-26 DIAGNOSIS — M150 Primary generalized (osteo)arthritis: Secondary | ICD-10-CM | POA: Diagnosis not present

## 2016-12-26 DIAGNOSIS — R918 Other nonspecific abnormal finding of lung field: Secondary | ICD-10-CM | POA: Diagnosis not present

## 2016-12-26 DIAGNOSIS — Z434 Encounter for attention to other artificial openings of digestive tract: Secondary | ICD-10-CM | POA: Diagnosis not present

## 2016-12-26 DIAGNOSIS — D638 Anemia in other chronic diseases classified elsewhere: Secondary | ICD-10-CM | POA: Diagnosis not present

## 2016-12-26 DIAGNOSIS — J449 Chronic obstructive pulmonary disease, unspecified: Secondary | ICD-10-CM | POA: Diagnosis not present

## 2016-12-26 DIAGNOSIS — Z48815 Encounter for surgical aftercare following surgery on the digestive system: Secondary | ICD-10-CM | POA: Diagnosis not present

## 2016-12-29 DIAGNOSIS — D638 Anemia in other chronic diseases classified elsewhere: Secondary | ICD-10-CM | POA: Diagnosis not present

## 2016-12-29 DIAGNOSIS — Z48815 Encounter for surgical aftercare following surgery on the digestive system: Secondary | ICD-10-CM | POA: Diagnosis not present

## 2016-12-29 DIAGNOSIS — M150 Primary generalized (osteo)arthritis: Secondary | ICD-10-CM | POA: Diagnosis not present

## 2016-12-29 DIAGNOSIS — J449 Chronic obstructive pulmonary disease, unspecified: Secondary | ICD-10-CM | POA: Diagnosis not present

## 2016-12-29 DIAGNOSIS — R918 Other nonspecific abnormal finding of lung field: Secondary | ICD-10-CM | POA: Diagnosis not present

## 2016-12-29 DIAGNOSIS — Z434 Encounter for attention to other artificial openings of digestive tract: Secondary | ICD-10-CM | POA: Diagnosis not present

## 2016-12-30 DIAGNOSIS — R0602 Shortness of breath: Secondary | ICD-10-CM | POA: Diagnosis not present

## 2016-12-30 DIAGNOSIS — J189 Pneumonia, unspecified organism: Secondary | ICD-10-CM | POA: Diagnosis not present

## 2016-12-30 DIAGNOSIS — R0902 Hypoxemia: Secondary | ICD-10-CM | POA: Diagnosis not present

## 2017-01-02 DIAGNOSIS — M150 Primary generalized (osteo)arthritis: Secondary | ICD-10-CM | POA: Diagnosis not present

## 2017-01-02 DIAGNOSIS — D638 Anemia in other chronic diseases classified elsewhere: Secondary | ICD-10-CM | POA: Diagnosis not present

## 2017-01-02 DIAGNOSIS — Z434 Encounter for attention to other artificial openings of digestive tract: Secondary | ICD-10-CM | POA: Diagnosis not present

## 2017-01-02 DIAGNOSIS — J449 Chronic obstructive pulmonary disease, unspecified: Secondary | ICD-10-CM | POA: Diagnosis not present

## 2017-01-02 DIAGNOSIS — R918 Other nonspecific abnormal finding of lung field: Secondary | ICD-10-CM | POA: Diagnosis not present

## 2017-01-02 DIAGNOSIS — Z48815 Encounter for surgical aftercare following surgery on the digestive system: Secondary | ICD-10-CM | POA: Diagnosis not present

## 2017-01-06 DIAGNOSIS — I10 Essential (primary) hypertension: Secondary | ICD-10-CM | POA: Diagnosis not present

## 2017-01-06 DIAGNOSIS — M810 Age-related osteoporosis without current pathological fracture: Secondary | ICD-10-CM | POA: Diagnosis not present

## 2017-01-06 DIAGNOSIS — J440 Chronic obstructive pulmonary disease with acute lower respiratory infection: Secondary | ICD-10-CM | POA: Diagnosis not present

## 2017-01-06 DIAGNOSIS — M150 Primary generalized (osteo)arthritis: Secondary | ICD-10-CM | POA: Diagnosis not present

## 2017-01-06 DIAGNOSIS — Z87891 Personal history of nicotine dependence: Secondary | ICD-10-CM | POA: Diagnosis not present

## 2017-01-06 DIAGNOSIS — J189 Pneumonia, unspecified organism: Secondary | ICD-10-CM | POA: Diagnosis not present

## 2017-01-06 DIAGNOSIS — D638 Anemia in other chronic diseases classified elsewhere: Secondary | ICD-10-CM | POA: Diagnosis not present

## 2017-01-06 DIAGNOSIS — K219 Gastro-esophageal reflux disease without esophagitis: Secondary | ICD-10-CM | POA: Diagnosis not present

## 2017-01-06 DIAGNOSIS — Z8521 Personal history of malignant neoplasm of larynx: Secondary | ICD-10-CM | POA: Diagnosis not present

## 2017-01-06 DIAGNOSIS — R918 Other nonspecific abnormal finding of lung field: Secondary | ICD-10-CM | POA: Diagnosis not present

## 2017-01-06 DIAGNOSIS — F329 Major depressive disorder, single episode, unspecified: Secondary | ICD-10-CM | POA: Diagnosis not present

## 2017-01-07 DIAGNOSIS — K219 Gastro-esophageal reflux disease without esophagitis: Secondary | ICD-10-CM | POA: Diagnosis not present

## 2017-01-07 DIAGNOSIS — M150 Primary generalized (osteo)arthritis: Secondary | ICD-10-CM | POA: Diagnosis not present

## 2017-01-07 DIAGNOSIS — J440 Chronic obstructive pulmonary disease with acute lower respiratory infection: Secondary | ICD-10-CM | POA: Diagnosis not present

## 2017-01-07 DIAGNOSIS — J189 Pneumonia, unspecified organism: Secondary | ICD-10-CM | POA: Diagnosis not present

## 2017-01-07 DIAGNOSIS — D638 Anemia in other chronic diseases classified elsewhere: Secondary | ICD-10-CM | POA: Diagnosis not present

## 2017-01-07 DIAGNOSIS — I10 Essential (primary) hypertension: Secondary | ICD-10-CM | POA: Diagnosis not present

## 2017-01-08 DIAGNOSIS — J189 Pneumonia, unspecified organism: Secondary | ICD-10-CM | POA: Diagnosis not present

## 2017-01-08 DIAGNOSIS — M150 Primary generalized (osteo)arthritis: Secondary | ICD-10-CM | POA: Diagnosis not present

## 2017-01-08 DIAGNOSIS — J440 Chronic obstructive pulmonary disease with acute lower respiratory infection: Secondary | ICD-10-CM | POA: Diagnosis not present

## 2017-01-08 DIAGNOSIS — K219 Gastro-esophageal reflux disease without esophagitis: Secondary | ICD-10-CM | POA: Diagnosis not present

## 2017-01-08 DIAGNOSIS — I10 Essential (primary) hypertension: Secondary | ICD-10-CM | POA: Diagnosis not present

## 2017-01-08 DIAGNOSIS — D638 Anemia in other chronic diseases classified elsewhere: Secondary | ICD-10-CM | POA: Diagnosis not present

## 2017-01-09 DIAGNOSIS — N401 Enlarged prostate with lower urinary tract symptoms: Secondary | ICD-10-CM | POA: Diagnosis not present

## 2017-01-09 DIAGNOSIS — M150 Primary generalized (osteo)arthritis: Secondary | ICD-10-CM | POA: Diagnosis not present

## 2017-01-09 DIAGNOSIS — E291 Testicular hypofunction: Secondary | ICD-10-CM | POA: Diagnosis not present

## 2017-01-09 DIAGNOSIS — I10 Essential (primary) hypertension: Secondary | ICD-10-CM | POA: Diagnosis not present

## 2017-01-09 DIAGNOSIS — J440 Chronic obstructive pulmonary disease with acute lower respiratory infection: Secondary | ICD-10-CM | POA: Diagnosis not present

## 2017-01-09 DIAGNOSIS — R972 Elevated prostate specific antigen [PSA]: Secondary | ICD-10-CM | POA: Diagnosis not present

## 2017-01-09 DIAGNOSIS — J189 Pneumonia, unspecified organism: Secondary | ICD-10-CM | POA: Diagnosis not present

## 2017-01-09 DIAGNOSIS — R3912 Poor urinary stream: Secondary | ICD-10-CM | POA: Diagnosis not present

## 2017-01-09 DIAGNOSIS — M810 Age-related osteoporosis without current pathological fracture: Secondary | ICD-10-CM | POA: Diagnosis not present

## 2017-01-09 DIAGNOSIS — D638 Anemia in other chronic diseases classified elsewhere: Secondary | ICD-10-CM | POA: Diagnosis not present

## 2017-01-09 DIAGNOSIS — K219 Gastro-esophageal reflux disease without esophagitis: Secondary | ICD-10-CM | POA: Diagnosis not present

## 2017-01-12 DIAGNOSIS — M150 Primary generalized (osteo)arthritis: Secondary | ICD-10-CM | POA: Diagnosis not present

## 2017-01-12 DIAGNOSIS — K219 Gastro-esophageal reflux disease without esophagitis: Secondary | ICD-10-CM | POA: Diagnosis not present

## 2017-01-12 DIAGNOSIS — J189 Pneumonia, unspecified organism: Secondary | ICD-10-CM | POA: Diagnosis not present

## 2017-01-12 DIAGNOSIS — K5900 Constipation, unspecified: Secondary | ICD-10-CM | POA: Diagnosis not present

## 2017-01-12 DIAGNOSIS — D638 Anemia in other chronic diseases classified elsewhere: Secondary | ICD-10-CM | POA: Diagnosis not present

## 2017-01-12 DIAGNOSIS — J440 Chronic obstructive pulmonary disease with acute lower respiratory infection: Secondary | ICD-10-CM | POA: Diagnosis not present

## 2017-01-12 DIAGNOSIS — I10 Essential (primary) hypertension: Secondary | ICD-10-CM | POA: Diagnosis not present

## 2017-01-13 DIAGNOSIS — M150 Primary generalized (osteo)arthritis: Secondary | ICD-10-CM | POA: Diagnosis not present

## 2017-01-13 DIAGNOSIS — D638 Anemia in other chronic diseases classified elsewhere: Secondary | ICD-10-CM | POA: Diagnosis not present

## 2017-01-13 DIAGNOSIS — J440 Chronic obstructive pulmonary disease with acute lower respiratory infection: Secondary | ICD-10-CM | POA: Diagnosis not present

## 2017-01-13 DIAGNOSIS — J189 Pneumonia, unspecified organism: Secondary | ICD-10-CM | POA: Diagnosis not present

## 2017-01-13 DIAGNOSIS — I10 Essential (primary) hypertension: Secondary | ICD-10-CM | POA: Diagnosis not present

## 2017-01-13 DIAGNOSIS — K219 Gastro-esophageal reflux disease without esophagitis: Secondary | ICD-10-CM | POA: Diagnosis not present

## 2017-01-16 DIAGNOSIS — K219 Gastro-esophageal reflux disease without esophagitis: Secondary | ICD-10-CM | POA: Diagnosis not present

## 2017-01-16 DIAGNOSIS — D638 Anemia in other chronic diseases classified elsewhere: Secondary | ICD-10-CM | POA: Diagnosis not present

## 2017-01-16 DIAGNOSIS — I10 Essential (primary) hypertension: Secondary | ICD-10-CM | POA: Diagnosis not present

## 2017-01-16 DIAGNOSIS — M150 Primary generalized (osteo)arthritis: Secondary | ICD-10-CM | POA: Diagnosis not present

## 2017-01-16 DIAGNOSIS — J189 Pneumonia, unspecified organism: Secondary | ICD-10-CM | POA: Diagnosis not present

## 2017-01-16 DIAGNOSIS — J440 Chronic obstructive pulmonary disease with acute lower respiratory infection: Secondary | ICD-10-CM | POA: Diagnosis not present

## 2017-01-19 DIAGNOSIS — K219 Gastro-esophageal reflux disease without esophagitis: Secondary | ICD-10-CM | POA: Diagnosis not present

## 2017-01-19 DIAGNOSIS — K5900 Constipation, unspecified: Secondary | ICD-10-CM | POA: Diagnosis not present

## 2017-01-19 DIAGNOSIS — R112 Nausea with vomiting, unspecified: Secondary | ICD-10-CM | POA: Diagnosis not present

## 2017-01-19 DIAGNOSIS — R945 Abnormal results of liver function studies: Secondary | ICD-10-CM | POA: Diagnosis not present

## 2017-01-19 DIAGNOSIS — R509 Fever, unspecified: Secondary | ICD-10-CM | POA: Diagnosis not present

## 2017-01-19 DIAGNOSIS — R634 Abnormal weight loss: Secondary | ICD-10-CM | POA: Diagnosis not present

## 2017-01-19 DIAGNOSIS — J181 Lobar pneumonia, unspecified organism: Secondary | ICD-10-CM | POA: Diagnosis not present

## 2017-01-19 DIAGNOSIS — K224 Dyskinesia of esophagus: Secondary | ICD-10-CM | POA: Diagnosis not present

## 2017-01-20 DIAGNOSIS — J440 Chronic obstructive pulmonary disease with acute lower respiratory infection: Secondary | ICD-10-CM | POA: Diagnosis not present

## 2017-01-20 DIAGNOSIS — I10 Essential (primary) hypertension: Secondary | ICD-10-CM | POA: Diagnosis not present

## 2017-01-20 DIAGNOSIS — D649 Anemia, unspecified: Secondary | ICD-10-CM | POA: Diagnosis not present

## 2017-01-20 DIAGNOSIS — M150 Primary generalized (osteo)arthritis: Secondary | ICD-10-CM | POA: Diagnosis not present

## 2017-01-20 DIAGNOSIS — D638 Anemia in other chronic diseases classified elsewhere: Secondary | ICD-10-CM | POA: Diagnosis not present

## 2017-01-20 DIAGNOSIS — J189 Pneumonia, unspecified organism: Secondary | ICD-10-CM | POA: Diagnosis not present

## 2017-01-20 DIAGNOSIS — R062 Wheezing: Secondary | ICD-10-CM | POA: Diagnosis not present

## 2017-01-20 DIAGNOSIS — K219 Gastro-esophageal reflux disease without esophagitis: Secondary | ICD-10-CM | POA: Diagnosis not present

## 2017-01-22 ENCOUNTER — Other Ambulatory Visit: Payer: Self-pay | Admitting: Physician Assistant

## 2017-01-22 ENCOUNTER — Emergency Department (HOSPITAL_COMMUNITY): Payer: Medicare Other

## 2017-01-22 ENCOUNTER — Emergency Department (HOSPITAL_COMMUNITY)
Admission: EM | Admit: 2017-01-22 | Discharge: 2017-01-22 | Disposition: A | Discharge status: Home / Self Care | Payer: Medicare Other | Attending: Emergency Medicine | Admitting: Emergency Medicine

## 2017-01-22 ENCOUNTER — Encounter (HOSPITAL_COMMUNITY): Payer: Self-pay

## 2017-01-22 ENCOUNTER — Other Ambulatory Visit (HOSPITAL_COMMUNITY): Payer: Self-pay | Admitting: Radiology

## 2017-01-22 ENCOUNTER — Ambulatory Visit
Admission: RE | Admit: 2017-01-22 | Discharge: 2017-01-22 | Disposition: A | Discharge status: Home / Self Care | Payer: Medicare Other | Source: Ambulatory Visit | Attending: Physician Assistant | Admitting: Physician Assistant

## 2017-01-22 DIAGNOSIS — R0602 Shortness of breath: Secondary | ICD-10-CM | POA: Diagnosis not present

## 2017-01-22 DIAGNOSIS — K5900 Constipation, unspecified: Secondary | ICD-10-CM

## 2017-01-22 DIAGNOSIS — E039 Hypothyroidism, unspecified: Secondary | ICD-10-CM | POA: Diagnosis not present

## 2017-01-22 DIAGNOSIS — J189 Pneumonia, unspecified organism: Secondary | ICD-10-CM

## 2017-01-22 DIAGNOSIS — I10 Essential (primary) hypertension: Secondary | ICD-10-CM | POA: Diagnosis not present

## 2017-01-22 DIAGNOSIS — R112 Nausea with vomiting, unspecified: Secondary | ICD-10-CM

## 2017-01-22 DIAGNOSIS — R109 Unspecified abdominal pain: Secondary | ICD-10-CM | POA: Diagnosis present

## 2017-01-22 DIAGNOSIS — Z87891 Personal history of nicotine dependence: Secondary | ICD-10-CM | POA: Insufficient documentation

## 2017-01-22 DIAGNOSIS — N400 Enlarged prostate without lower urinary tract symptoms: Secondary | ICD-10-CM | POA: Diagnosis not present

## 2017-01-22 DIAGNOSIS — J449 Chronic obstructive pulmonary disease, unspecified: Secondary | ICD-10-CM | POA: Diagnosis not present

## 2017-01-22 DIAGNOSIS — R05 Cough: Secondary | ICD-10-CM | POA: Diagnosis not present

## 2017-01-22 DIAGNOSIS — R1084 Generalized abdominal pain: Secondary | ICD-10-CM | POA: Diagnosis not present

## 2017-01-22 LAB — COMPREHENSIVE METABOLIC PANEL
ALBUMIN: 4.1 g/dL (ref 3.5–5.0)
ALT: 13 U/L — ABNORMAL LOW (ref 17–63)
ANION GAP: 7 (ref 5–15)
AST: 20 U/L (ref 15–41)
Alkaline Phosphatase: 21 U/L — ABNORMAL LOW (ref 38–126)
BUN: 13 mg/dL (ref 6–20)
CO2: 27 mmol/L (ref 22–32)
Calcium: 9.9 mg/dL (ref 8.9–10.3)
Chloride: 103 mmol/L (ref 101–111)
Creatinine, Ser: 0.93 mg/dL (ref 0.61–1.24)
Glucose, Bld: 112 mg/dL — ABNORMAL HIGH (ref 65–99)
Potassium: 4.3 mmol/L (ref 3.5–5.1)
Sodium: 137 mmol/L (ref 135–145)
TOTAL PROTEIN: 7.2 g/dL (ref 6.5–8.1)
Total Bilirubin: 0.4 mg/dL (ref 0.3–1.2)

## 2017-01-22 LAB — CBC
HEMATOCRIT: 34.4 % — AB (ref 39.0–52.0)
HEMOGLOBIN: 11 g/dL — AB (ref 13.0–17.0)
MCH: 28.1 pg (ref 26.0–34.0)
MCHC: 32 g/dL (ref 30.0–36.0)
MCV: 87.8 fL (ref 78.0–100.0)
Platelets: 289 10*3/uL (ref 150–400)
RBC: 3.92 MIL/uL — ABNORMAL LOW (ref 4.22–5.81)
RDW: 15.3 % (ref 11.5–15.5)
WBC: 6.6 10*3/uL (ref 4.0–10.5)

## 2017-01-22 LAB — URINALYSIS, ROUTINE W REFLEX MICROSCOPIC
Bilirubin Urine: NEGATIVE
Glucose, UA: NEGATIVE mg/dL
Hgb urine dipstick: NEGATIVE
KETONES UR: 5 mg/dL — AB
LEUKOCYTES UA: NEGATIVE
NITRITE: NEGATIVE
PH: 5 (ref 5.0–8.0)
Protein, ur: NEGATIVE mg/dL
SPECIFIC GRAVITY, URINE: 1.028 (ref 1.005–1.030)

## 2017-01-22 LAB — I-STAT CG4 LACTIC ACID, ED
LACTIC ACID, VENOUS: 1.11 mmol/L (ref 0.5–1.9)
LACTIC ACID, VENOUS: 1.46 mmol/L (ref 0.5–1.9)

## 2017-01-22 LAB — LIPASE, BLOOD: Lipase: 18 U/L (ref 11–51)

## 2017-01-22 MED ORDER — IOPAMIDOL (ISOVUE-300) INJECTION 61%
INTRAVENOUS | Status: AC
  Administered 2017-01-22: 100 mL
  Filled 2017-01-22: qty 100

## 2017-01-22 MED ORDER — POLYETHYLENE GLYCOL 3350 17 GM/SCOOP PO POWD: 17.0000 g | Freq: Every day | ORAL | 0 refills | Status: DC

## 2017-01-22 NOTE — ED Triage Notes (Signed)
Patient reports that he was sent from [facility] imaging after have abdominal xrays this am and found to have small bowel obstruction. Constipation with nausea and vomiting, none today. Reports minimal pain

## 2017-01-22 NOTE — ED Provider Notes (Signed)
[facility] DEPT Provider Note   CSN: [id number] Arrival date & time: 01/22/17  1239     History   Chief Complaint Chief Complaint  Patient presents with  . Abdominal Pain-Bowel obs  . Emesis    HPI [name] is a 71 y.o. male.  HPI Patient presents to the emergency department after an x-ray performed as an outpatient today demonstrated possible small bowel obstruction.  He has no nausea or vomiting for the past 48 hours.  Several days ago he was having nausea vomiting without significant diarrhea.  He has had a partial colectomy as well as a cholecystectomy.  He reports no abdominal discomfort or pain at this time.  He reports no abdominal swelling at this time.  No history of a bowel obstruction.  He is urinating without difficulty.  No fevers or chills.  Given the findings on the x-ray he was told by his doctor come the ER for CT scan to better define the pathology.  He states he's having no significant stools over the past several days but does have some thin fluid leaking.   Past Medical History:  Diagnosis Date  . Anxiety state, unspecified   . Arthritis    osteo arthritis  . Chronic airway obstruction, not elsewhere classified   . Depressive disorder, not elsewhere classified   . GERD (gastroesophageal reflux disease)   . Hoarseness of voice   . Hypertension   . Hypothyroidism   . Osteoporosis   . Reflux gastritis   . S/P radiation therapy 06/14/2015 through 07/30/2015    Glottic larynx 6600 cGy in 33 sessions   . 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 Tobacco chew use     Patient Active Problem List   Diagnosis Date Noted  . Acute respiratory failure with hypoxia ([place]) 12/10/2016  . HCAP (healthcare-associated pneumonia) 12/03/2016  . History of cholecystectomy 12/03/2016  . Hypertension 12/03/2016  . Chronic pain 12/03/2016  . GERD (gastroesophageal reflux  disease) 12/03/2016  . Hiatal hernia 10/30/2016  . Osteoporosis 08/11/2016  . Tobacco use disorder 07/30/2015  . 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 Hypogonadism male 02/24/2012  . Crohn's colitis ([place]) 02/24/2012  . GERD 01/14/2011  . ALLERGIC RHINITIS 10/22/2010  . Hypothyroidism 09/16/2007  . Anxiety state 09/16/2007  . DEPRESSION 09/16/2007  . COPD GOLD 0  09/16/2007  . Lung nodule 09/16/2007    Past Surgical History:  Procedure Laterality Date  . ABDOMINAL SURGERY  1971   terminal ileum removed, ileitis   . Biospy of the Right True Vocal Cord Right 04/23/2015  . CHOLECYSTECTOMY N/A 10/31/2016   Procedure: OPEN CHOLECYSTECTOMY;  Surgeon: Rolm Bookbinder, MD;  Location: [facility];  Service: General;  Laterality: N/A;  . MICROLARYNGOSCOPY Right 04/23/2015   Procedure: MICROLARYNGOSCOPY WITH BIOPSY RIGHT TRUE VOCAL CORD;  Surgeon: Izora Gala, MD;  Location: [facility];  Service: ENT;  Laterality: Right;  . SHOULDER SURGERY    . TONSILLECTOMY     as child       Home Medications    Prior to Admission medications   Medication Sig Start Date End Date Taking? Authorizing Provider  albuterol (VENTOLIN HFA) 108 (90 BASE) MCG/ACT inhaler Inhale 2 puffs into the lungs every 6 (six) hours as needed. 05/05/13   Elsie Stain, MD  amLODipine (NORVASC) 10 MG tablet Take 10 mg by mouth daily. 06/06/16   Historical Provider, MD  DULoxetine (CYMBALTA) 30 MG capsule Take  3 capsules by mouth daily.    Historical Provider, MD  levothyroxine (SYNTHROID, LEVOTHROID) 150 MCG tablet Take 150 mcg by mouth daily.      Historical Provider, MD  mometasone-formoterol (DULERA) 100-5 MCG/ACT AERO Take 2 puffs first thing in am and then another 2 puffs about 12 hours later. 08/11/16   Javier Glazier, MD  moxifloxacin (AVELOX) 400 MG tablet Take 400 mg by mouth daily at 8 pm.    Historical Provider, MD  nicotine polacrilex (NICORETTE) 4 MG gum Take 4 mg by mouth as  needed for smoking cessation.    Historical Provider, MD  ondansetron (ZOFRAN) 8 MG tablet Take 8 mg by mouth every 8 (eight) hours as needed for nausea. 09/18/16   Historical Provider, MD  oxyCODONE (ROXICODONE) 15 MG immediate release tablet Take 0.5 tablets (7.5 mg total) by mouth 4 (four) times daily. 11/06/16   Robbie Lis, MD  polyethylene glycol powder (MIRALAX) powder Take 17 g by mouth daily. 01/22/17   Jola Schmidt, MD  ranitidine (ZANTAC) 150 MG capsule One at bedtime Patient taking differently: Take 150 mg by mouth every evening. One at bedtime 12/08/13   Tanda Rockers, MD  testosterone cypionate (DEPOTESTOTERONE CYPIONATE) 200 MG/ML injection Inject 0.5 mLs into the muscle once a week.    Historical Provider, MD  Zoledronic Acid (ZOMETA IV) Inject into the vein every 3 (three) months.    Historical Provider, MD    Family History Family History  Problem Relation Age of Onset  . Prostate cancer Father   . Lung cancer Father   . Allergies Sister     Social History Social History  Substance Use Topics  . Smoking status: Former Smoker    Packs/day: 2.00    Years: 43.00    Types: Cigarettes    Quit date: 12/01/2001  . Smokeless tobacco: Never Used     Comment: 2ppd x 43 years.  Still uses nicotine gum. Quit smoking 13 years ago  . Alcohol use No     Allergies   Doxycycline; Guaifenesin; Morphine sulfate; and Tapentadol   Review of Systems Review of Systems  All other systems reviewed and are negative.    Physical Exam Updated Vital Signs BP 153/87 (BP Location: Left Arm)   Pulse 76   Temp 98.7 F (37.1 C) (Oral)   Resp 18   Ht 6\' 3"  (1.905 m)   Wt 180 lb (81.6 kg)   SpO2 99%   BMI 22.50 kg/m   Physical Exam  Constitutional: He is oriented to person, place, and time. He appears well-developed and well-nourished.  HENT:  Head: Normocephalic and atraumatic.  Eyes: EOM are normal.  Neck: Normal range of motion.  Cardiovascular: Normal rate, regular rhythm  and normal heart sounds.   Pulmonary/Chest: Effort normal and breath sounds normal. No respiratory distress.  Abdominal: Soft.  Mild distention.  Mild generalized tenderness.  Musculoskeletal: Normal range of motion.  Neurological: He is alert and oriented to person, place, and time.  Skin: Skin is warm and dry.  Psychiatric: He has a normal mood and affect. Judgment normal.  Nursing note and vitals reviewed.    ED Treatments / Results  Labs (all labs ordered are listed, but only abnormal results are displayed) Labs Reviewed  COMPREHENSIVE METABOLIC PANEL - Abnormal; Notable for the following:       Result Value   Glucose, Bld 112 (*)    ALT 13 (*)    Alkaline Phosphatase 21 (*)  All other components within normal limits  CBC - Abnormal; Notable for the following:    RBC 3.92 (*)    Hemoglobin 11.0 (*)    HCT 34.4 (*)    All other components within normal limits  URINALYSIS, ROUTINE W REFLEX MICROSCOPIC - Abnormal; Notable for the following:    APPearance HAZY (*)    Ketones, ur 5 (*)    All other components within normal limits  LIPASE, BLOOD  I-STAT CG4 LACTIC ACID, ED  I-STAT CG4 LACTIC ACID, ED    EKG  EKG Interpretation None       Radiology Dg Chest 2 View  Result Date: 01/22/2017 CLINICAL DATA:  Follow-up of pneumonia. Persistent intermittent shortness of breath and cough. History of COPD, former smoker. EXAM: CHEST  2 VIEW COMPARISON:  Chest x-ray of December 25, 2016 FINDINGS: The lungs remain mildly hyperinflated. There is a stable calcified nodule in the retrosternal region. There is no alveolar infiltrate or pleural effusion. The heart and pulmonary vascularity are normal. There is calcification in the wall of the aortic arch. There is multilevel degenerative disc disease of the thoracic spine. IMPRESSION: COPD. No pneumonia, CHF, nor other acute cardiopulmonary abnormality. Thoracic aortic atherosclerosis. Electronically Signed   By: David  Martinique M.D.   On:  01/22/2017 09:23   Ct Abdomen Pelvis W Contrast  Result Date: 01/22/2017 CLINICAL DATA:  Nausea vomiting.  Suspected small bowel obstruction. EXAM: CT ABDOMEN AND PELVIS WITH CONTRAST TECHNIQUE: Multidetector CT imaging of the abdomen and pelvis was performed using the standard protocol following bolus administration of intravenous contrast. CONTRAST:  164mL ISOVUE-300 IOPAMIDOL (ISOVUE-300) INJECTION 61% COMPARISON:  Abdominal radiograph 01/22/2017 FINDINGS: Lower chest: Hyperinflation of the lungs. Lingular scarring. Left lower lobe subpleural 13 mm soft tissue mass. Moderate in size hiatal hernia. Hepatobiliary: No focal liver abnormality is seen. Gallbladder is contracted and therefore suboptimally evaluated, but there is no evidence of gallbladder wall thickening, or biliary dilatation. Pancreas: Unremarkable. No pancreatic ductal dilatation or surrounding inflammatory changes. Spleen: Normal in size without focal abnormality. Splenic granulomas noted. Adrenals/Urinary Tract: Adrenal glands are unremarkable. Kidneys are without renal calculi, suspicious focal lesion, or hydronephrosis. Large benign-appearing exophytic superior pole left renal cyst measures 4.7 cm. Diffuse bladder wall thickening, likely due to chronic outlet obstruction, given the enlarged globular prostate gland encroaching on the base of the urinary bladder. Stomach/Bowel: Normal size of the stomach. The small bowel is largely decompressed. Large amount of formed stool throughout the colon, consistent with severe constipation. Vascular/Lymphatic: Aortic atherosclerosis. No enlarged abdominal or pelvic lymph nodes. Reproductive: The prostate gland is enlarged, heterogeneous in attenuation, globular and protrudes into the base of the urinary bladder. Other: No abdominal wall hernia or abnormality. No abdominopelvic ascites. Musculoskeletal: L5-S1 osteoarthritic changes. IMPRESSION: COPD. 13 mm subpleural left lower lobe pulmonary mass,  stable from 2014 and therefore likely benign. No evidence small bowel obstruction. Severe constipation. Markedly enlarged and heterogeneous prostate gland. Please correlate to serum PSA values. Diffuse thickening of the urinary bladder wall, likely due to chronic outlet obstruction. Electronically Signed   By: Fidela Salisbury M.D.   On: 01/22/2017 16:52   Dg Abd 2 Views  Result Date: 01/22/2017 CLINICAL DATA:  Two months of nausea and vomiting. One month of constipation. History of small bowel surgery 50 years ago. EXAM: ABDOMEN - 2 VIEW COMPARISON:  Abdominal and pelvic CT scan of October 30, 2016 FINDINGS: There are multiple small air-fluid levels in the mid and upper abdomen. These likely lie within  small bowel. There is some stool and gas within the colon and a small amount in the rectum. No free extraluminal gas collections are observed. There are numerous surgical vascular clips in the right lower quadrant and right upper pelvis. There are rounded calcifications in the left upper quadrant likely in the spleen. A calcification projects in the right mid abdomen that could lie within the right kidney but it is seen on 1 of the two views only. No right-sided kidney stones were observed on the November 2017 abdominal CT scan. There are numerous pelvic phleboliths. There are degenerative changes of the lower lumbar spine. IMPRESSION: Findings worrisome for a partial distal small bowel obstruction though a mild small bowel ileus could produce similar findings. There is no evidence of perforation. Electronically Signed   By: David  Martinique M.D.   On: 01/22/2017 09:26    Procedures Procedures (including critical care time)  Medications Ordered in ED Medications  iopamidol (ISOVUE-300) 61 % injection (100 mLs  Contrast Given 01/22/17 1616)     Initial Impression / Assessment and Plan / ED Course  I have reviewed the triage vital signs and the nursing notes.  Pertinent labs & imaging results that  were available during my care of the patient were reviewed by me and considered in my medical decision making (see chart for details).     CT scan demonstrates no evidence for bowel obstruction.  The patient is well-appearing.  He is keeping fluids down the ER.  He does have prostatic hypertrophy and possible chronic bladder outlet obstruction.  He is R did discuss this with his urologist and he and his urologist or working on a plan to better manage this which may require medications and/or surgery.  Patient understands return the ER for new or worsening symptoms.  Final Clinical Impressions(s) / ED Diagnoses   Final diagnoses:  Abdominal pain, unspecified abdominal location  Constipation, unspecified constipation type  Prostatic hypertrophy    New Prescriptions New Prescriptions   POLYETHYLENE GLYCOL POWDER (MIRALAX) POWDER    Take 17 g by mouth daily.     Jola Schmidt, MD 01/22/17 [phone]

## 2017-01-22 NOTE — ED Notes (Signed)
Called CT advised pt has IV and ready anytime for CT

## 2017-01-22 NOTE — ED Notes (Signed)
Pt stable, ambulatory, states understanding of discharge instructions 

## 2017-01-23 DIAGNOSIS — K5903 Drug induced constipation: Secondary | ICD-10-CM | POA: Diagnosis not present

## 2017-01-23 DIAGNOSIS — R112 Nausea with vomiting, unspecified: Secondary | ICD-10-CM | POA: Diagnosis not present

## 2017-01-23 DIAGNOSIS — T402X5A Adverse effect of other opioids, initial encounter: Secondary | ICD-10-CM | POA: Diagnosis not present

## 2017-01-27 DIAGNOSIS — J440 Chronic obstructive pulmonary disease with acute lower respiratory infection: Secondary | ICD-10-CM | POA: Diagnosis not present

## 2017-01-27 DIAGNOSIS — J189 Pneumonia, unspecified organism: Secondary | ICD-10-CM | POA: Diagnosis not present

## 2017-01-27 DIAGNOSIS — D638 Anemia in other chronic diseases classified elsewhere: Secondary | ICD-10-CM | POA: Diagnosis not present

## 2017-01-27 DIAGNOSIS — I10 Essential (primary) hypertension: Secondary | ICD-10-CM | POA: Diagnosis not present

## 2017-01-27 DIAGNOSIS — K219 Gastro-esophageal reflux disease without esophagitis: Secondary | ICD-10-CM | POA: Diagnosis not present

## 2017-01-27 DIAGNOSIS — M150 Primary generalized (osteo)arthritis: Secondary | ICD-10-CM | POA: Diagnosis not present

## 2017-01-30 DIAGNOSIS — M810 Age-related osteoporosis without current pathological fracture: Secondary | ICD-10-CM | POA: Diagnosis not present

## 2017-01-30 DIAGNOSIS — E291 Testicular hypofunction: Secondary | ICD-10-CM | POA: Diagnosis not present

## 2017-01-31 NOTE — Assessment & Plan Note (Signed)
Hopefully he can respond quickly to a Z-Pak and hydration. ER/urgent care if gets worse.

## 2017-01-31 NOTE — Assessment & Plan Note (Signed)
We discussed reflux precautions and symptoms. History does not suggest an acute reflux aspiration event

## 2017-02-04 DIAGNOSIS — I10 Essential (primary) hypertension: Secondary | ICD-10-CM | POA: Diagnosis not present

## 2017-02-04 DIAGNOSIS — M150 Primary generalized (osteo)arthritis: Secondary | ICD-10-CM | POA: Diagnosis not present

## 2017-02-04 DIAGNOSIS — J189 Pneumonia, unspecified organism: Secondary | ICD-10-CM | POA: Diagnosis not present

## 2017-02-04 DIAGNOSIS — J440 Chronic obstructive pulmonary disease with acute lower respiratory infection: Secondary | ICD-10-CM | POA: Diagnosis not present

## 2017-02-04 DIAGNOSIS — D638 Anemia in other chronic diseases classified elsewhere: Secondary | ICD-10-CM | POA: Diagnosis not present

## 2017-02-04 DIAGNOSIS — K219 Gastro-esophageal reflux disease without esophagitis: Secondary | ICD-10-CM | POA: Diagnosis not present

## 2017-02-09 DIAGNOSIS — Z79891 Long term (current) use of opiate analgesic: Secondary | ICD-10-CM | POA: Diagnosis not present

## 2017-02-09 DIAGNOSIS — G894 Chronic pain syndrome: Secondary | ICD-10-CM | POA: Diagnosis not present

## 2017-02-09 DIAGNOSIS — M150 Primary generalized (osteo)arthritis: Secondary | ICD-10-CM | POA: Diagnosis not present

## 2017-02-10 ENCOUNTER — Telehealth: Payer: Self-pay | Admitting: Pulmonary Disease

## 2017-02-10 DIAGNOSIS — D638 Anemia in other chronic diseases classified elsewhere: Secondary | ICD-10-CM | POA: Diagnosis not present

## 2017-02-10 DIAGNOSIS — J189 Pneumonia, unspecified organism: Secondary | ICD-10-CM | POA: Diagnosis not present

## 2017-02-10 DIAGNOSIS — I10 Essential (primary) hypertension: Secondary | ICD-10-CM | POA: Diagnosis not present

## 2017-02-10 DIAGNOSIS — K219 Gastro-esophageal reflux disease without esophagitis: Secondary | ICD-10-CM | POA: Diagnosis not present

## 2017-02-10 DIAGNOSIS — J440 Chronic obstructive pulmonary disease with acute lower respiratory infection: Secondary | ICD-10-CM | POA: Diagnosis not present

## 2017-02-10 DIAGNOSIS — R490 Dysphonia: Secondary | ICD-10-CM | POA: Diagnosis not present

## 2017-02-10 DIAGNOSIS — M150 Primary generalized (osteo)arthritis: Secondary | ICD-10-CM | POA: Diagnosis not present

## 2017-02-10 DIAGNOSIS — Z923 Personal history of irradiation: Secondary | ICD-10-CM | POA: Diagnosis not present

## 2017-02-10 DIAGNOSIS — Z8521 Personal history of malignant neoplasm of larynx: Secondary | ICD-10-CM | POA: Diagnosis not present

## 2017-02-10 NOTE — Telephone Encounter (Signed)
Spoke with Museum/gallery conservator at [facility], states she saw pt today who presented with crackling in LLL, increased fatigue, increased sob X2 days.  Pt is not coughing up anything.  Denies fever, chills.   Pt has an appt with ENT today, and an appt with JN on Thursday.  atc pt, line rang several times with no answer, no option for vm.  Wcb.

## 2017-02-11 NOTE — Telephone Encounter (Signed)
Called and spoke to pt's wife. She states pt is doing better and already has an appt with JN on 3.15.18 and will keep appt with JN. Nothing further needed at this time.

## 2017-02-12 ENCOUNTER — Ambulatory Visit (INDEPENDENT_AMBULATORY_CARE_PROVIDER_SITE_OTHER): Payer: Medicare Other | Admitting: Pulmonary Disease

## 2017-02-12 ENCOUNTER — Encounter: Payer: Self-pay | Admitting: Pulmonary Disease

## 2017-02-12 VITALS — BP 132/76 | HR 79 | Ht 73.0 in | Wt 184.0 lb

## 2017-02-12 DIAGNOSIS — J449 Chronic obstructive pulmonary disease, unspecified: Secondary | ICD-10-CM

## 2017-02-12 DIAGNOSIS — K219 Gastro-esophageal reflux disease without esophagitis: Secondary | ICD-10-CM | POA: Diagnosis not present

## 2017-02-12 DIAGNOSIS — R918 Other nonspecific abnormal finding of lung field: Secondary | ICD-10-CM

## 2017-02-12 LAB — PULMONARY FUNCTION TEST
DL/VA % pred: 80 %
DL/VA: 3.84 ml/min/mmHg/L
DLCO cor % pred: 70 %
DLCO cor: 25.66 ml/min/mmHg
DLCO unc % pred: 62 %
DLCO unc: 22.61 ml/min/mmHg
FEF 25-75 Post: 2.51 L/sec
FEF 25-75 Pre: 2.12 L/sec
FEF2575-%Change-Post: 18 %
FEF2575-%Pred-Post: 95 %
FEF2575-%Pred-Pre: 81 %
FEV1-%Change-Post: 4 %
FEV1-%PRED-PRE: 90 %
FEV1-%Pred-Post: 94 %
FEV1-POST: 3.36 L
FEV1-Pre: 3.22 L
FEV1FVC-%Change-Post: 4 %
FEV1FVC-%Pred-Pre: 97 %
FEV6-%Change-Post: 0 %
FEV6-%PRED-PRE: 97 %
FEV6-%Pred-Post: 98 %
FEV6-POST: 4.48 L
FEV6-Pre: 4.46 L
FEV6FVC-%Change-Post: 0 %
FEV6FVC-%PRED-POST: 105 %
FEV6FVC-%Pred-Pre: 105 %
FVC-%CHANGE-POST: 0 %
FVC-%PRED-POST: 93 %
FVC-%PRED-PRE: 93 %
FVC-POST: 4.51 L
FVC-PRE: 4.53 L
POST FEV6/FVC RATIO: 100 %
PRE FEV6/FVC RATIO: 99 %
Post FEV1/FVC ratio: 75 %
Pre FEV1/FVC ratio: 71 %
RV % PRED: 134 %
RV: 3.6 L
TLC % PRED: 105 %
TLC: 8.1 L

## 2017-02-12 NOTE — Progress Notes (Signed)
PFT done today. 

## 2017-02-12 NOTE — Progress Notes (Signed)
Subjective:    Patient ID: [name], male    DOB: [birth year], 71 y.o.   MRN: [id number]  C.C.:  Follow-up for Bilateral Lung Nodules, COPD, & GERD.  HPI Bilateral lung nodules: Previously referred to me after these were found on CT imaging in September 2016. Denies any chest pain or pressure.   COPD: Currently prescribed Dulera. No evidence of airway obstruction on his spirometry today. He reports his breathing is doing "good". He denies any coughing or wheezing. He reports no need for his rescue inhaler.   GERD: Currently prescribed Zantac. Denies any morning brash water taste, reflux or dyspepsia.   Review of Systems  No fever, chills, or sweats. No rashes or bruising. No headaches or vision changes.   Allergies  Allergen Reactions  . Doxycycline Nausea And Vomiting  . Guaifenesin Nausea And Vomiting  . Morphine Sulfate Other (See Comments)  . Tapentadol Other (See Comments)    Current Outpatient Prescriptions on File Prior to Visit  Medication Sig Dispense Refill  . albuterol (VENTOLIN HFA) 108 (90 BASE) MCG/ACT inhaler Inhale 2 puffs into the lungs every 6 (six) hours as needed. 1 Inhaler 3  . amLODipine (NORVASC) 10 MG tablet Take 10 mg by mouth daily.  0  . DULoxetine (CYMBALTA) 30 MG capsule Take 3 capsules by mouth daily.    [name] Kitchen levothyroxine (SYNTHROID, LEVOTHROID) 150 MCG tablet Take 150 mcg by mouth daily.      . mometasone-formoterol (DULERA) 100-5 MCG/ACT AERO Take 2 puffs first thing in am and then another 2 puffs about 12 hours later. 3 Inhaler 1  . moxifloxacin (AVELOX) 400 MG tablet Take 400 mg by mouth daily at 8 pm.    . nicotine polacrilex (NICORETTE) 4 MG gum Take 4 mg by mouth as needed for smoking cessation.    . ondansetron (ZOFRAN) 8 MG tablet Take 8 mg by mouth every 8 (eight) hours as needed for nausea.  0  . oxyCODONE (ROXICODONE) 15 MG immediate release tablet Take 0.5 tablets (7.5 mg total) by mouth 4 (four) times daily. 15 tablet 0  .  polyethylene glycol powder (MIRALAX) powder Take 17 g by mouth daily. 255 g 0  . ranitidine (ZANTAC) 150 MG capsule One at bedtime (Patient taking differently: Take 150 mg by mouth every evening. One at bedtime) 30 capsule 11  . testosterone cypionate (DEPOTESTOTERONE CYPIONATE) 200 MG/ML injection Inject 0.5 mLs into the muscle once a week.    . Zoledronic Acid (ZOMETA IV) Inject into the vein every 3 (three) months.     No current facility-administered medications on file prior to visit.     Past Medical History:  Diagnosis Date  . Anxiety state, unspecified   . Arthritis    osteo arthritis  . Chronic airway obstruction, not elsewhere classified   . Depressive disorder, not elsewhere classified   . GERD (gastroesophageal reflux disease)   . Hoarseness of voice   . Hypertension   . Hypothyroidism   . Osteoporosis   . Reflux gastritis   . S/P radiation therapy 06/14/2015 through 07/30/2015    Glottic larynx 6600 cGy in 33 sessions   . 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 Tobacco chew use     Past Surgical History:  Procedure Laterality Date  . ABDOMINAL SURGERY  1971   terminal ileum removed, ileitis   . Biospy of the Right True Vocal Cord Right 04/23/2015  . CHOLECYSTECTOMY N/A 10/31/2016   Procedure: OPEN CHOLECYSTECTOMY;  Surgeon: Rolm Bookbinder, MD;  Location: [facility];  Service: General;  Laterality: N/A;  . MICROLARYNGOSCOPY Right 04/23/2015   Procedure: MICROLARYNGOSCOPY WITH BIOPSY RIGHT TRUE VOCAL CORD;  Surgeon: Izora Gala, MD;  Location: [facility];  Service: ENT;  Laterality: Right;  . SHOULDER SURGERY    . TONSILLECTOMY     as child    Family History  Problem Relation Age of Onset  . Prostate cancer Father   . Lung cancer Father   . Allergies Sister     Social History   Social History  . Marital status: Married    Spouse name: N/A  . Number  of children: N/A  . Years of education: N/A   Social History Main Topics  . Smoking status: Former Smoker    Packs/day: 2.00    Years: 43.00    Types: Cigarettes    Quit date: 12/01/2001  . Smokeless tobacco: Never Used     Comment: 2ppd x 43 years.  Still uses nicotine gum. Quit smoking 13 years ago  . Alcohol use No  . Drug use: No  . Sexual activity: Not Asked   Other Topics Concern  . None   Social History Narrative   Originally from [place]. Moved to [place] with job relocation in 1996. Married. Previously also lived in [place]. No international travel. 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 Currently has a dog. No bird exposure. No mold exposure. No hot tub exposure. Does wood working with domestic woods.       Objective:   Physical Exam BP 132/76 (BP Location: Right Arm, Patient Position: Sitting, Cuff Size: Large)   Pulse 79   Ht 6\' 1"  (1.854 m)   Wt 184 lb (83.5 kg)   SpO2 97%   BMI 24.28 kg/m   Gen.: No distress. Comfortable. Awake. Integument: Warm and dry. No rash or bruising on exposed skin. Pulmonary: Mild intermittent squeaks. Good aeration bilaterally. No accessory muscle use on room air. Cardiovascular: Regular rate. No edema or appreciable JVD. Abdomen: Soft. Nondistended. Normal bowel sounds. HEENT: Moist mucous membranes. No scleral injection or icterus. No nasal turbinate swelling.  PFT 02/12/17: FVC 4.53 L (93%) FEV1 3.22 L (90%) FEV1/FVC 0.71 FEF 25-75 2.12 L (81%) negative bronchodilator response TLC 8.10 L (105%) RV 134% ERV 124% DLCO corrected 70% 09/08/14: FVC 4.85 L (98%) FEV1 3.46 L (94%) FEV1/FVC 0.71 FEF 25-75 2.30 L (83%) negative bronchodilator response  IMAGING LD CT CHEST W/O 03/13/16 (previously reviewed by me): Multiple pulmonary nodules bilaterally unchanged in size. Largest nodule 1.3 cm posterior aspect left lower lobe. Nodule reportedly stable going back to 03/28/05. Calcified nodules noted. Mild diffuse bronchial  wall thickening & mild centrilobular/paraseptal emphysema. No pleural effusion. Calcified granulomas noted in the liver and spleen. No pleural thickening appreciated either. No pathologic mediastinal adenopathy. Recommended repeat CT imaging in 12 months. LLL lung nodule is indeed stable going back to 2006 on my review.   LD CHEST CT W/O 08/22/15 (per radiologist): Multiple pulmonary nodules bilaterally with largest in the posterior left lower lobe measuring 1.3 cm & stable compared to recent prior exam from 08/15/14. Nodule reportedly present and virtually unchanged compared with studies going back to 2007. Largest noncalcified pulmonary nodule in the medial aspect left lower lobe measuring 6 mm. Calcified nodules noted. Cylindrical) varicose bronchiectasis with thickening of. Bronchovascular interstitium and extensive peri-bronchovascular micro-nodularities with architectural distortion in the inferior segment of the lingula. Mild diffuse bronchial wall thickening  and mild centrilobular and paraseptal emphysema. No pleural effusion. Recommended for short-term follow-up with imaging in 6 months.  LABS 10/22/10 IgE:  <1.5    Assessment & Plan:  71 y.o. male with bilateral lung nodules, COPD mixed type, & GERD.  Patient's spirometry today shows no evidence of airway obstruction or significant bronchodilator response. With his underlying history of allergies I do feel he has more of a mixed type COPD. Overall he seems to be symptomatically well controlled. He will need continued follow-up imaging for his multiple lung nodules with low-dose chest CT imaging. His reflux is well controlled. I instructed the patient to contact my office if he had any new breathing problems or questions before his next appointment.  1. Multiple lung nodules:  Continuing to monitor yearly with low dose chest CT scan with next one in April of this year.  2. COPD Mixed Type:  Patient continuing on Dulera. No changes in her current  inhaler regimen.  3. GERD:  Continuing Zantac. No changes.  4. Health Maintenance:  S/P influenza vaccine September 2017, Prevnar September 2015, & Pneumovax October 2011.  5. Follow-up:  Return to clinic in 6 months or sooner if needed.   Sonia Baller Ashok Cordia, M.D. [facility] Pager:  [phone] After 3pm or if no response, call [phone] 2:32 PM 02/12/17

## 2017-02-12 NOTE — Patient Instructions (Addendum)
   You should have your screening CT scan in April. Call and let me know if they don't contact you to schedule this.  Call me if you have any new breathing problems before your next appointment.   I will see you back in 6 months or sooner if needed.

## 2017-02-17 DIAGNOSIS — J189 Pneumonia, unspecified organism: Secondary | ICD-10-CM | POA: Diagnosis not present

## 2017-02-17 DIAGNOSIS — K219 Gastro-esophageal reflux disease without esophagitis: Secondary | ICD-10-CM | POA: Diagnosis not present

## 2017-02-17 DIAGNOSIS — M150 Primary generalized (osteo)arthritis: Secondary | ICD-10-CM | POA: Diagnosis not present

## 2017-02-17 DIAGNOSIS — J440 Chronic obstructive pulmonary disease with acute lower respiratory infection: Secondary | ICD-10-CM | POA: Diagnosis not present

## 2017-02-17 DIAGNOSIS — D638 Anemia in other chronic diseases classified elsewhere: Secondary | ICD-10-CM | POA: Diagnosis not present

## 2017-02-17 DIAGNOSIS — I10 Essential (primary) hypertension: Secondary | ICD-10-CM | POA: Diagnosis not present

## 2017-02-24 DIAGNOSIS — J189 Pneumonia, unspecified organism: Secondary | ICD-10-CM | POA: Diagnosis not present

## 2017-02-24 DIAGNOSIS — D638 Anemia in other chronic diseases classified elsewhere: Secondary | ICD-10-CM | POA: Diagnosis not present

## 2017-02-24 DIAGNOSIS — J440 Chronic obstructive pulmonary disease with acute lower respiratory infection: Secondary | ICD-10-CM | POA: Diagnosis not present

## 2017-02-24 DIAGNOSIS — K219 Gastro-esophageal reflux disease without esophagitis: Secondary | ICD-10-CM | POA: Diagnosis not present

## 2017-02-24 DIAGNOSIS — M150 Primary generalized (osteo)arthritis: Secondary | ICD-10-CM | POA: Diagnosis not present

## 2017-02-24 DIAGNOSIS — I10 Essential (primary) hypertension: Secondary | ICD-10-CM | POA: Diagnosis not present

## 2017-03-02 ENCOUNTER — Telehealth: Payer: Self-pay | Admitting: Pulmonary Disease

## 2017-03-03 DIAGNOSIS — M150 Primary generalized (osteo)arthritis: Secondary | ICD-10-CM | POA: Diagnosis not present

## 2017-03-03 DIAGNOSIS — J189 Pneumonia, unspecified organism: Secondary | ICD-10-CM | POA: Diagnosis not present

## 2017-03-03 DIAGNOSIS — K219 Gastro-esophageal reflux disease without esophagitis: Secondary | ICD-10-CM | POA: Diagnosis not present

## 2017-03-03 DIAGNOSIS — I10 Essential (primary) hypertension: Secondary | ICD-10-CM | POA: Diagnosis not present

## 2017-03-03 DIAGNOSIS — D638 Anemia in other chronic diseases classified elsewhere: Secondary | ICD-10-CM | POA: Diagnosis not present

## 2017-03-03 DIAGNOSIS — J440 Chronic obstructive pulmonary disease with acute lower respiratory infection: Secondary | ICD-10-CM | POA: Diagnosis not present

## 2017-03-03 NOTE — Telephone Encounter (Signed)
Spoke with Legrand Como at [facility], states that 02 has been picked up and nothing outstanding on their end with patient's account.  Will close encounter.

## 2017-03-03 NOTE — Telephone Encounter (Deleted)
Spoke with pt and pt's son, states that they have reviewed hospital discharge summary and no longer have questions regarding follow up care.  Pt scheduled for HFU with SG next week.  Nothing further needed.

## 2017-03-09 DIAGNOSIS — K5900 Constipation, unspecified: Secondary | ICD-10-CM | POA: Diagnosis not present

## 2017-03-09 DIAGNOSIS — K219 Gastro-esophageal reflux disease without esophagitis: Secondary | ICD-10-CM | POA: Diagnosis not present

## 2017-03-09 DIAGNOSIS — R933 Abnormal findings on diagnostic imaging of other parts of digestive tract: Secondary | ICD-10-CM | POA: Diagnosis not present

## 2017-03-16 ENCOUNTER — Ambulatory Visit (INDEPENDENT_AMBULATORY_CARE_PROVIDER_SITE_OTHER)
Admission: RE | Admit: 2017-03-16 | Discharge: 2017-03-16 | Disposition: A | Discharge status: Home / Self Care | Payer: Medicare Other | Source: Ambulatory Visit | Attending: Acute Care | Admitting: Acute Care

## 2017-03-16 DIAGNOSIS — Z87891 Personal history of nicotine dependence: Secondary | ICD-10-CM

## 2017-03-20 DIAGNOSIS — K136 Irritative hyperplasia of oral mucosa: Secondary | ICD-10-CM | POA: Diagnosis not present

## 2017-03-25 DIAGNOSIS — E291 Testicular hypofunction: Secondary | ICD-10-CM | POA: Diagnosis not present

## 2017-03-25 DIAGNOSIS — R972 Elevated prostate specific antigen [PSA]: Secondary | ICD-10-CM | POA: Diagnosis not present

## 2017-03-25 DIAGNOSIS — K136 Irritative hyperplasia of oral mucosa: Secondary | ICD-10-CM | POA: Diagnosis not present

## 2017-03-26 ENCOUNTER — Telehealth: Payer: Self-pay | Admitting: Acute Care

## 2017-03-27 DIAGNOSIS — H2513 Age-related nuclear cataract, bilateral: Secondary | ICD-10-CM | POA: Diagnosis not present

## 2017-03-27 NOTE — Telephone Encounter (Signed)
Will forward to the lung screening pool 

## 2017-03-30 NOTE — Telephone Encounter (Signed)
Spoke with pt and informed of CT results per Eric Form, NP.  Pt verbalized understanding.  Copy sent to PCP.

## 2017-04-06 DIAGNOSIS — Z79891 Long term (current) use of opiate analgesic: Secondary | ICD-10-CM | POA: Diagnosis not present

## 2017-04-06 DIAGNOSIS — G894 Chronic pain syndrome: Secondary | ICD-10-CM | POA: Diagnosis not present

## 2017-04-06 DIAGNOSIS — M150 Primary generalized (osteo)arthritis: Secondary | ICD-10-CM | POA: Diagnosis not present

## 2017-05-08 DIAGNOSIS — R490 Dysphonia: Secondary | ICD-10-CM | POA: Diagnosis not present

## 2017-05-08 DIAGNOSIS — Z8521 Personal history of malignant neoplasm of larynx: Secondary | ICD-10-CM | POA: Diagnosis not present

## 2017-05-08 DIAGNOSIS — Z923 Personal history of irradiation: Secondary | ICD-10-CM | POA: Diagnosis not present

## 2017-05-11 DIAGNOSIS — F322 Major depressive disorder, single episode, severe without psychotic features: Secondary | ICD-10-CM | POA: Diagnosis not present

## 2017-05-11 DIAGNOSIS — K219 Gastro-esophageal reflux disease without esophagitis: Secondary | ICD-10-CM | POA: Diagnosis not present

## 2017-05-11 DIAGNOSIS — E039 Hypothyroidism, unspecified: Secondary | ICD-10-CM | POA: Diagnosis not present

## 2017-05-11 DIAGNOSIS — R110 Nausea: Secondary | ICD-10-CM | POA: Diagnosis not present

## 2017-05-11 DIAGNOSIS — I10 Essential (primary) hypertension: Secondary | ICD-10-CM | POA: Diagnosis not present

## 2017-05-15 DIAGNOSIS — M810 Age-related osteoporosis without current pathological fracture: Secondary | ICD-10-CM | POA: Diagnosis not present

## 2017-06-01 DIAGNOSIS — M150 Primary generalized (osteo)arthritis: Secondary | ICD-10-CM | POA: Diagnosis not present

## 2017-06-01 DIAGNOSIS — G894 Chronic pain syndrome: Secondary | ICD-10-CM | POA: Diagnosis not present

## 2017-06-01 DIAGNOSIS — Z79891 Long term (current) use of opiate analgesic: Secondary | ICD-10-CM | POA: Diagnosis not present

## 2017-07-02 ENCOUNTER — Other Ambulatory Visit: Payer: Self-pay

## 2017-07-02 NOTE — Patient Outreach (Signed)
[facility]) Care Management  07/02/2017  [name] 1946/04/24 [id number]  EMMI: Engagement Referral date: 07/01/17 Referral source: Emmi engagement referral Referral reason: Referral score: 7 Attempt #2  Telephone call to patient regarding [facility] engagement referral. Unable to reach patient. HIPAA compliant voice message left with call back phone number. [name] Kitchen   PLAN: RNCM will attempt second telephone outreach to patient within 1 week.   Quinn Plowman RN,BSN,CCM [facility] Telephonic  [phone]

## 2017-07-07 ENCOUNTER — Other Ambulatory Visit: Payer: Self-pay

## 2017-07-07 NOTE — Patient Outreach (Signed)
[facility]) Care Management  07/07/2017  [name] 02-18-1946 [id number]  EMMI: Engagement Referral date: 07/01/17 Referral source: Emmi engagement referral Referral reason: Referral score: 7 Attempt #2  Telephone call to patient regarding [name] engagement referral. Contact answering phone states patient is not available.   HIPAA compliant message left with call back phone number. [name] Kitchen   PLAN: RNCM will attempt third telephone outreach to patient within 1 week.   Quinn Plowman RN,BSN,CCM [facility] Telephonic  [phone]

## 2017-07-09 ENCOUNTER — Other Ambulatory Visit: Payer: Self-pay

## 2017-07-09 NOTE — Patient Outreach (Signed)
[facility]) Care Management  07/09/2017  [name] [birth year] [id number]   EMMI: prevent Referral date: 07/01/17 Referral source: EMMI prevent referral Referral reason: Score 7   Telephone call to patient regarding EMMI prevent referral. HIPAA verified with patient. RNCM discussed EMM prevent program with patient. Patient states he does not  Need any services at this time. Patient states he is independent in his care and his health conditions are under control.  Patient verbally agreed to received [facility] care management outreach letter / brochure for future reference.    PLAN: RNCM will refer patient to care management assistant to close due to refusal of services.  RNCM will notify patients primary MD of closure.  RNCM will send patient [facility] outreach letter/ brochure and 24 hour nurse line magnet.   Quinn Plowman RN,BSN,CCM [facility] Telephonic  [phone]

## 2017-07-27 DIAGNOSIS — Z79891 Long term (current) use of opiate analgesic: Secondary | ICD-10-CM | POA: Diagnosis not present

## 2017-07-27 DIAGNOSIS — G894 Chronic pain syndrome: Secondary | ICD-10-CM | POA: Diagnosis not present

## 2017-07-27 DIAGNOSIS — M150 Primary generalized (osteo)arthritis: Secondary | ICD-10-CM | POA: Diagnosis not present

## 2017-08-19 ENCOUNTER — Encounter: Payer: Self-pay | Admitting: Pulmonary Disease

## 2017-08-19 ENCOUNTER — Ambulatory Visit (INDEPENDENT_AMBULATORY_CARE_PROVIDER_SITE_OTHER): Payer: Medicare Other | Admitting: Pulmonary Disease

## 2017-08-19 ENCOUNTER — Other Ambulatory Visit: Payer: Medicare Other

## 2017-08-19 VITALS — BP 140/80 | HR 67 | Ht 73.0 in | Wt 208.0 lb

## 2017-08-19 DIAGNOSIS — R918 Other nonspecific abnormal finding of lung field: Secondary | ICD-10-CM | POA: Diagnosis not present

## 2017-08-19 DIAGNOSIS — K219 Gastro-esophageal reflux disease without esophagitis: Secondary | ICD-10-CM

## 2017-08-19 DIAGNOSIS — Z23 Encounter for immunization: Secondary | ICD-10-CM

## 2017-08-19 DIAGNOSIS — J449 Chronic obstructive pulmonary disease, unspecified: Secondary | ICD-10-CM | POA: Diagnosis not present

## 2017-08-19 NOTE — Patient Instructions (Addendum)
   Continue taking your medications as prescribed.  Call our office if you have any new breathing problems or questions before your next appointment.  We will see you back in 1 year or sooner if needed.   TESTS ORDERED: 1. Serum alpha-1 antitrypsin phenotype 2. Full PFTs at follow-up appointment

## 2017-08-19 NOTE — Progress Notes (Signed)
Subjective:    Patient ID: [name], male    DOB: [birth year], 71 y.o.   MRN: [id number]  C.C.:  Follow-up for Mixed Type COPD, Multiple Lung Nodules, & GERD.  HPI Mixed type COPD: Patient also with some mild emphysema seen on CT imaging. Prescribed Dulera. No new breathing problems since last appointment. No exacerbations since last appointment. He reports rare to never need for his rescue inhaler. No coughing or wheezing.   Multiple lung nodules: Some nodules with partial calcification. Largest nodule measuring 1.3 cm left lower lobe without significant change in size compared with 2006. Patient no longer qualifies for lung cancer screening. No suspicious or enlarging lesions otherwise.  GERD: Previously prescribed Zantac. Protonix added to patient's regimen since last appointment. No reflux or dyspepsia. No morning brash water taste.   Review of Systems  He reports some mild post-nasal drip but no sinus pressure or pain. No fever or chills. No chest pain or pressure.   Allergies  Allergen Reactions  . Doxycycline Nausea And Vomiting  . Guaifenesin Nausea And Vomiting  . Morphine Sulfate Other (See Comments)  . Tapentadol Other (See Comments)    Current Outpatient Prescriptions on File Prior to Visit  Medication Sig Dispense Refill  . albuterol (VENTOLIN HFA) 108 (90 BASE) MCG/ACT inhaler Inhale 2 puffs into the lungs every 6 (six) hours as needed. 1 Inhaler 3  . amLODipine (NORVASC) 10 MG tablet Take 10 mg by mouth daily.  0  . DULoxetine (CYMBALTA) 30 MG capsule Take 3 capsules by mouth daily.    [name] Kitchen levothyroxine (SYNTHROID, LEVOTHROID) 150 MCG tablet Take 150 mcg by mouth daily.      . mometasone-formoterol (DULERA) 100-5 MCG/ACT AERO Take 2 puffs first thing in am and then another 2 puffs about 12 hours later. 3 Inhaler 1  . ondansetron (ZOFRAN) 8 MG tablet Take 8 mg by mouth every 8 (eight) hours as needed for nausea.  0  . oxyCODONE (ROXICODONE) 15 MG immediate release  tablet Take 0.5 tablets (7.5 mg total) by mouth 4 (four) times daily. 15 tablet 0  . testosterone cypionate (DEPOTESTOTERONE CYPIONATE) 200 MG/ML injection Inject 0.5 mLs into the muscle once a week.    . Zoledronic Acid (ZOMETA IV) Inject into the vein every 3 (three) months.     No current facility-administered medications on file prior to visit.     Past Medical History:  Diagnosis Date  . Anxiety state, unspecified   . Arthritis    osteo arthritis  . Chronic airway obstruction, not elsewhere classified   . Depressive disorder, not elsewhere classified   . GERD (gastroesophageal reflux disease)   . Hoarseness of voice   . Hypertension   . Hypothyroidism   . Osteoporosis   . Reflux gastritis   . S/P radiation therapy 06/14/2015 through 07/30/2015    Glottic larynx 6600 cGy in 33 sessions   . 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 Tobacco chew use     Past Surgical History:  Procedure Laterality Date  . ABDOMINAL SURGERY  1971   terminal ileum removed, ileitis   . Biospy of the Right True Vocal Cord Right 04/23/2015  . CHOLECYSTECTOMY N/A 10/31/2016   Procedure: OPEN CHOLECYSTECTOMY;  Surgeon: Rolm Bookbinder, MD;  Location: [place];  Service: General;  Laterality: N/A;  . MICROLARYNGOSCOPY Right 04/23/2015   Procedure: MICROLARYNGOSCOPY WITH BIOPSY RIGHT TRUE VOCAL CORD;  Surgeon: Izora Gala, MD;  Location: [place];  Service: ENT;  Laterality: Right;  . SHOULDER SURGERY    . TONSILLECTOMY     as child    Family History  Problem Relation Age of Onset  . Prostate cancer Father   . Lung cancer Father   . Allergies Sister     Social History   Social History  . Marital status: Married    Spouse name: N/A  . Number of children: N/A  . Years of education: N/A   Social History Main Topics  . Smoking status: Former Smoker    Packs/day: 2.00    Years: 43.00     Types: Cigarettes    Quit date: 12/01/2001  . Smokeless tobacco: Never Used     Comment: 2ppd x 43 years.  Still uses nicotine gum. Quit smoking 13 years ago  . Alcohol use No  . Drug use: No  . Sexual activity: Not Asked   Other Topics Concern  . None   Social History Narrative   Originally from [place]. Moved to [place] with job relocation in 1996. Married. Previously also lived in [place]. No international travel. 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 Currently has a dog. No bird exposure. No mold exposure. No hot tub exposure. Does wood working with domestic woods.       Objective:   Physical Exam BP 140/80 (BP Location: Left Arm, Cuff Size: Normal)   Pulse 67   Ht 6\' 1"  (1.854 m)   Wt 208 lb (94.3 kg)   SpO2 96%   BMI 27.44 kg/m   General:  Awake.Accompanied by wife. No distress.  Integument:  Warm & dry. No rash on exposed skin. No bruising on exposed skin. Extremities:  No cyanosis or clubbing.  Lymphatics: No appreciated cervical or supraclavicular lymphadenopathy. HEENT:  No nasal turbinate swelling. No oral ulcers. Moist mucous membranes. Cardiovascular:  Regular rate. No edema. Unable to appreciate JVD.  Pulmonary:  Clear bilaterally to auscultation. Normal work of breathing on room air. Abdomen: Soft. Normal bowel sounds. Protuberant. Musculoskeletal:  Normal bulk and tone. Mild kyphosis. No joint deformity or effusion appreciated.  PFT 02/12/17: FVC 4.53 L (93%) FEV1 3.22 L (90%) FEV1/FVC 0.71 FEF 25-75 2.12 L (81%) negative bronchodilator response TLC 8.10 L (105%) RV 134% ERV 124% DLCO corrected 70% 09/08/14: FVC 4.85 L (98%) FEV1 3.46 L (94%) FEV1/FVC 0.71 FEF 25-75 2.30 L (83%) negative bronchodilator response  IMAGING LD CT CHEST W/O 03/16/17 (personally reviewed by me):  Patient has apical predominant mild centrilobular & paraseptal emphysema. Mild diffuse bronchial wall thickening. Scattered subcentimeter calcified and noncalcified  pulmonary nodules. Largest nodule without calcification measuring 1.3 cm in left lower lobe without significant change in size compared with previous CT imaging going back to 2006. Questionable areas of mild bronchiectasis. No pleural effusion or thickening. No pericardial effusion. No pathologic mediastinal adenopathy.  LD CT CHEST W/O 03/13/16 (previously reviewed by me): Multiple pulmonary nodules bilaterally unchanged in size. Largest nodule 1.3 cm posterior aspect left lower lobe. Nodule reportedly stable going back to 03/28/05. Calcified nodules noted. Mild diffuse bronchial wall thickening & mild centrilobular/paraseptal emphysema. No pleural effusion. Calcified granulomas noted in the liver and spleen. No pleural thickening appreciated either. No pathologic mediastinal adenopathy. Recommended repeat CT imaging in 12 months. LLL lung nodule is indeed stable going back to 2006 on my review.   LD CHEST CT W/O 08/22/15 (per radiologist): Multiple pulmonary nodules bilaterally with largest in the posterior left lower lobe measuring 1.3 cm &  stable compared to recent prior exam from 08/15/14. Nodule reportedly present and virtually unchanged compared with studies going back to 2007. Largest noncalcified pulmonary nodule in the medial aspect left lower lobe measuring 6 mm. Calcified nodules noted. Cylindrical) varicose bronchiectasis with thickening of. Bronchovascular interstitium and extensive peri-bronchovascular micro-nodularities with architectural distortion in the inferior segment of the lingula. Mild diffuse bronchial wall thickening and mild centrilobular and paraseptal emphysema. No pleural effusion. Recommended for short-term follow-up with imaging in 6 months.  LABS 10/22/10 IgE:  <1.5    Assessment & Plan:  71 y.o. male with mixed type COPD. Patient also has emphysema on imaging. His reflux seems to be reasonably well controlled. I reviewed his chest CT scan from April which shows no change in  his bilateral subcentimeter nodules as well as his left lower lobe nodule. At this time no further imaging is necessary per current guidelines. His COPD seems to be well-controlled on his current inhaler regimen of Dulera. I'm holding off on de-escalating his inhaled corticosteroid dose at this time. I instructed the patient to contact our office if he had any new breathing problems before his next appointment as we would be happy to see him sooner.  1. COPD mixed type: Screening for alpha-1 antitrypsin deficiency today. Continuing Dulera. Repeat full pulmonary function testing at next appointment. 2. Multiple lung nodules: No signs of progression. No plans for further imaging at this time. 3. GERD: Controlled with Protonix and Zantac. No changes. 4. Health maintenance: Status post Prevnar September 2015 & Pneumovax October 2011. Administering high-dose influenza vaccine today. 5. Follow-up: Return to clinic in 1 year or sooner if needed.  Sonia Baller Ashok Cordia, M.D. [facility] Pager:  [phone] After 3pm or if no response, call [phone] 9:25 AM 08/19/17

## 2017-08-22 LAB — ALPHA-1 ANTITRYPSIN PHENOTYPE: A1 ANTITRYPSIN SER: 80 mg/dL — AB (ref 83–199)

## 2017-08-25 DIAGNOSIS — M810 Age-related osteoporosis without current pathological fracture: Secondary | ICD-10-CM | POA: Diagnosis not present

## 2017-09-02 ENCOUNTER — Other Ambulatory Visit: Payer: Self-pay | Admitting: Pulmonary Disease

## 2017-09-11 DIAGNOSIS — Z8521 Personal history of malignant neoplasm of larynx: Secondary | ICD-10-CM | POA: Diagnosis not present

## 2017-09-11 DIAGNOSIS — Z923 Personal history of irradiation: Secondary | ICD-10-CM | POA: Diagnosis not present

## 2017-09-11 DIAGNOSIS — R490 Dysphonia: Secondary | ICD-10-CM | POA: Diagnosis not present

## 2017-09-24 DIAGNOSIS — G894 Chronic pain syndrome: Secondary | ICD-10-CM | POA: Diagnosis not present

## 2017-09-24 DIAGNOSIS — M150 Primary generalized (osteo)arthritis: Secondary | ICD-10-CM | POA: Diagnosis not present

## 2017-09-24 DIAGNOSIS — Z79891 Long term (current) use of opiate analgesic: Secondary | ICD-10-CM | POA: Diagnosis not present

## 2017-10-05 DIAGNOSIS — R933 Abnormal findings on diagnostic imaging of other parts of digestive tract: Secondary | ICD-10-CM | POA: Diagnosis not present

## 2017-10-05 DIAGNOSIS — K219 Gastro-esophageal reflux disease without esophagitis: Secondary | ICD-10-CM | POA: Diagnosis not present

## 2017-10-05 DIAGNOSIS — K5900 Constipation, unspecified: Secondary | ICD-10-CM | POA: Diagnosis not present

## 2017-11-18 DIAGNOSIS — E039 Hypothyroidism, unspecified: Secondary | ICD-10-CM | POA: Diagnosis not present

## 2017-11-18 DIAGNOSIS — I10 Essential (primary) hypertension: Secondary | ICD-10-CM | POA: Diagnosis not present

## 2017-11-18 DIAGNOSIS — K219 Gastro-esophageal reflux disease without esophagitis: Secondary | ICD-10-CM | POA: Diagnosis not present

## 2017-11-18 DIAGNOSIS — R718 Other abnormality of red blood cells: Secondary | ICD-10-CM | POA: Diagnosis not present

## 2017-11-18 DIAGNOSIS — F322 Major depressive disorder, single episode, severe without psychotic features: Secondary | ICD-10-CM | POA: Diagnosis not present

## 2017-11-19 DIAGNOSIS — M810 Age-related osteoporosis without current pathological fracture: Secondary | ICD-10-CM | POA: Diagnosis not present

## 2017-11-19 DIAGNOSIS — C61 Malignant neoplasm of prostate: Secondary | ICD-10-CM | POA: Diagnosis not present

## 2017-11-19 DIAGNOSIS — Z79891 Long term (current) use of opiate analgesic: Secondary | ICD-10-CM | POA: Diagnosis not present

## 2017-11-19 DIAGNOSIS — M150 Primary generalized (osteo)arthritis: Secondary | ICD-10-CM | POA: Diagnosis not present

## 2017-11-19 DIAGNOSIS — G894 Chronic pain syndrome: Secondary | ICD-10-CM | POA: Diagnosis not present

## 2017-12-24 DIAGNOSIS — K2900 Acute gastritis without bleeding: Secondary | ICD-10-CM | POA: Diagnosis not present

## 2017-12-24 DIAGNOSIS — K449 Diaphragmatic hernia without obstruction or gangrene: Secondary | ICD-10-CM | POA: Diagnosis not present

## 2017-12-24 DIAGNOSIS — K219 Gastro-esophageal reflux disease without esophagitis: Secondary | ICD-10-CM | POA: Diagnosis not present

## 2017-12-24 DIAGNOSIS — K2930 Chronic superficial gastritis without bleeding: Secondary | ICD-10-CM | POA: Diagnosis not present

## 2017-12-24 DIAGNOSIS — R12 Heartburn: Secondary | ICD-10-CM | POA: Diagnosis not present

## 2018-01-05 DIAGNOSIS — E039 Hypothyroidism, unspecified: Secondary | ICD-10-CM | POA: Diagnosis not present

## 2018-01-08 DIAGNOSIS — Z8521 Personal history of malignant neoplasm of larynx: Secondary | ICD-10-CM | POA: Diagnosis not present

## 2018-01-18 DIAGNOSIS — M150 Primary generalized (osteo)arthritis: Secondary | ICD-10-CM | POA: Diagnosis not present

## 2018-01-18 DIAGNOSIS — G894 Chronic pain syndrome: Secondary | ICD-10-CM | POA: Diagnosis not present

## 2018-01-18 DIAGNOSIS — Z79891 Long term (current) use of opiate analgesic: Secondary | ICD-10-CM | POA: Diagnosis not present

## 2018-02-19 DIAGNOSIS — M810 Age-related osteoporosis without current pathological fracture: Secondary | ICD-10-CM | POA: Diagnosis not present

## 2018-02-19 DIAGNOSIS — C61 Malignant neoplasm of prostate: Secondary | ICD-10-CM | POA: Diagnosis not present

## 2018-03-03 DIAGNOSIS — E039 Hypothyroidism, unspecified: Secondary | ICD-10-CM | POA: Diagnosis not present

## 2018-03-08 ENCOUNTER — Telehealth: Payer: Self-pay

## 2018-03-08 NOTE — Telephone Encounter (Signed)
Sent referral to scheduling, attached notes to April file.  

## 2018-03-11 ENCOUNTER — Encounter: Payer: Self-pay | Admitting: Cardiology

## 2018-03-11 ENCOUNTER — Ambulatory Visit (INDEPENDENT_AMBULATORY_CARE_PROVIDER_SITE_OTHER): Payer: Medicare Other | Admitting: Cardiology

## 2018-03-11 VITALS — BP 150/90 | HR 77 | Ht 73.0 in | Wt 223.8 lb

## 2018-03-11 DIAGNOSIS — Z87891 Personal history of nicotine dependence: Secondary | ICD-10-CM | POA: Insufficient documentation

## 2018-03-11 DIAGNOSIS — F3341 Major depressive disorder, recurrent, in partial remission: Secondary | ICD-10-CM | POA: Insufficient documentation

## 2018-03-11 DIAGNOSIS — R0602 Shortness of breath: Secondary | ICD-10-CM | POA: Diagnosis not present

## 2018-03-11 DIAGNOSIS — C320 Malignant neoplasm of glottis: Secondary | ICD-10-CM | POA: Insufficient documentation

## 2018-03-11 DIAGNOSIS — I10 Essential (primary) hypertension: Secondary | ICD-10-CM

## 2018-03-11 DIAGNOSIS — R198 Other specified symptoms and signs involving the digestive system and abdomen: Secondary | ICD-10-CM | POA: Insufficient documentation

## 2018-03-11 DIAGNOSIS — J329 Chronic sinusitis, unspecified: Secondary | ICD-10-CM | POA: Insufficient documentation

## 2018-03-11 DIAGNOSIS — F329 Major depressive disorder, single episode, unspecified: Secondary | ICD-10-CM | POA: Insufficient documentation

## 2018-03-11 DIAGNOSIS — M1990 Unspecified osteoarthritis, unspecified site: Secondary | ICD-10-CM | POA: Insufficient documentation

## 2018-03-11 NOTE — Patient Instructions (Signed)
Medication Instructions:  Your physician recommends that you continue on your current medications as directed. Please refer to the Current Medication list given to you today.  Labwork: None  Testing/Procedures: Your physician has requested that you have an echocardiogram. Echocardiography is a painless test that uses sound waves to create images of your heart. It provides your doctor with information about the size and shape of your heart and how well your heart's chambers and valves are working. This procedure takes approximately one hour. There are no restrictions for this procedure.  Your physician has requested that you have en exercise stress myoview. For further information please visit [email or web address]. Please follow instruction sheet, as given.  Follow-Up: Your physician recommends that you schedule a follow-up appointment in: June, 2019  Any Other Special Instructions Will Be Listed Below (If Applicable).     If you need a refill on your cardiac medications before your next appointment, please call your pharmacy.   [place], RN, BSN   Echocardiogram An echocardiogram, or echocardiography, uses sound waves (ultrasound) to produce an image of your heart. The echocardiogram is simple, painless, obtained within a short period of time, and offers valuable information to your health care provider. The images from an echocardiogram can provide information such as:  Evidence of coronary artery disease (CAD).  Heart size.  Heart muscle function.  Heart valve function.  Aneurysm detection.  Evidence of a past heart attack.  Fluid buildup around the heart.  Heart muscle thickening.  Assess heart valve function.  Tell a health care provider about:  Any allergies you have.  All medicines you are taking, including vitamins, herbs, eye drops, creams, and over-the-counter medicines.  Any problems you or family members have had with anesthetic  medicines.  Any blood disorders you have.  Any surgeries you have had.  Any medical conditions you have.  Whether you are pregnant or may be pregnant. What happens before the procedure? No special preparation is needed. Eat and drink normally. What happens during the procedure?  In order to produce an image of your heart, gel will be applied to your chest and a wand-like tool (transducer) will be moved over your chest. The gel will help transmit the sound waves from the transducer. The sound waves will harmlessly bounce off your heart to allow the heart images to be captured in real-time motion. These images will then be recorded.  You may need an IV to receive a medicine that improves the quality of the pictures. What happens after the procedure? You may return to your normal schedule including diet, activities, and medicines, unless your health care provider tells you otherwise. This information is not intended to replace advice given to you by your health care provider. Make sure you discuss any questions you have with your health care provider. Document Released: 11/14/2000 Document Revised: 07/05/2016 Document Reviewed: 07/25/2013 Elsevier Interactive Patient Education  2017 [employer].

## 2018-03-11 NOTE — Progress Notes (Signed)
Cardiology Office Note:    Date:  03/11/2018   ID:  [name], DOB June 21, 1946, MRN [id number]  PCP:  Carol Ada, MD  Cardiologist:  Jenean Lindau, MD   Referring MD: Carol Ada, MD    ASSESSMENT:    1. Essential hypertension   2. Ex-cigarette smoker   3. Shortness of breath on exertion    PLAN:    In order of problems listed above:  1. Primary prevention stressed with patient.  Importance of compliance with diet and medications stressed he vocalized understanding. 2. His blood pressure stable.  He mentions to me that his lipids are always fine. 3. Echocardiogram will be moving to assess murmur heard on auscultation.  I also will schedule the patient for an exercise stress nuclear testing. 4. Patient will be seen in follow-up appointment in the month of June, he knows to go to the nearest emergency room for any significant concerns.   Medication Adjustments/Labs and Tests Ordered: Current medicines are reviewed at length with the patient today.  Concerns regarding medicines are outlined above.  No orders of the defined types were placed in this encounter.  No orders of the defined types were placed in this encounter.    History of Present Illness:    [name] is a 72 y.o. male who is being seen today for the evaluation of shortness of breath on exertion at the request of Carol Ada, MD.  Patient is a pleasant 72 year old male.  He has past medical history of essential hypertension.  The patient mentions to me that he has pulmonary issues and is under the care of a pulmonologist for a long time.  He mentions to me that he is feeling a little tired and short of breath on exertion and is concerned about the symptoms and is here for evaluation.  He did not mention of any high cholesterol.  He tells me it was fine the last time it was checked.  No orthopnea PND or syncope.  He does not exercise on a regular basis because of shortness of breath on exertion  issues.  At the time of my evaluation, the patient is alert awake oriented and in no distress.  Wife accompanies him for this visit.  Past Medical History:  Diagnosis Date  . Anxiety state, unspecified   . Arthritis    osteo arthritis  . Chronic airway obstruction, not elsewhere classified   . Depressive disorder, not elsewhere classified   . GERD (gastroesophageal reflux disease)   . Hoarseness of voice   . Hypertension   . Hypothyroidism   . Osteoporosis   . Reflux gastritis   . S/P radiation therapy 06/14/2015 through 07/30/2015    Glottic larynx 6600 cGy in 33 sessions   . 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 Tobacco chew use     Past Surgical History:  Procedure Laterality Date  . ABDOMINAL SURGERY  1971   terminal ileum removed, ileitis   . Biospy of the Right True Vocal Cord Right 04/23/2015  . CHOLECYSTECTOMY N/A 10/31/2016   Procedure: OPEN CHOLECYSTECTOMY;  Surgeon: Rolm Bookbinder, MD;  Location: [place];  Service: General;  Laterality: N/A;  . MICROLARYNGOSCOPY Right 04/23/2015   Procedure: MICROLARYNGOSCOPY WITH BIOPSY RIGHT TRUE VOCAL CORD;  Surgeon: Izora Gala, MD;  Location: [place];  Service: ENT;  Laterality: Right;  . SHOULDER SURGERY    . TONSILLECTOMY     as child    Current  Medications: Current Meds  Medication Sig  . albuterol (VENTOLIN HFA) 108 (90 BASE) MCG/ACT inhaler Inhale 2 puffs into the lungs every 6 (six) hours as needed.  [name] Kitchen amLODipine (NORVASC) 10 MG tablet Take 10 mg by mouth daily.  . DULERA 100-5 MCG/ACT AERO USE 2 INHALATIONS FIRST THING IN THE MORNING AND THEN ANOTHER 2 INHALATIONS ABOUT 12 HOURS LATER  . DULoxetine (CYMBALTA) 30 MG capsule Take 3 capsules by mouth daily.  [name] Kitchen levothyroxine (SYNTHROID, LEVOTHROID) 200 MCG tablet   . ondansetron (ZOFRAN) 8 MG tablet Take 8 mg by mouth every 8 (eight) hours as needed for nausea.   [name] Kitchen oxyCODONE-acetaminophen (PERCOCET) 10-325 MG tablet Take 2 tablets by mouth 4 (four) times daily.  . pantoprazole (PROTONIX) 20 MG tablet Take 20 mg by mouth daily.  [name] Kitchen testosterone cypionate (DEPOTESTOTERONE CYPIONATE) 200 MG/ML injection Inject 0.5 mLs into the muscle once a week.  . Zoledronic Acid (ZOMETA IV) Inject into the vein every 3 (three) months.  . [DISCONTINUED] levothyroxine (SYNTHROID, LEVOTHROID) 150 MCG tablet Take 150 mcg by mouth daily.    . [DISCONTINUED] levothyroxine (SYNTHROID, LEVOTHROID) 175 MCG tablet   . [DISCONTINUED] oxyCODONE (ROXICODONE) 15 MG immediate release tablet Take 0.5 tablets (7.5 mg total) by mouth 4 (four) times daily.  . [DISCONTINUED] oxyCODONE-acetaminophen (PERCOCET) 10-325 MG tablet   . [DISCONTINUED] Polyethylene Glycol 3350 (PEG 3350) POWD Take by mouth.     Allergies:   Doxycycline; Guaifenesin; Morphine sulfate; and Tapentadol   Social History   Socioeconomic History  . Marital status: Married    Spouse name: Not on file  . Number of children: Not on file  . Years of education: Not on file  . Highest education level: Not on file  Occupational History  . Not on file  Social Needs  . Financial resource strain: Not on file  . Food insecurity:    Worry: Not on file    Inability: Not on file  . Transportation needs:    Medical: Not on file    Non-medical: Not on file  Tobacco Use  . Smoking status: Former Smoker    Packs/day: 2.00    Years: 43.00    Pack years: 86.00    Types: Cigarettes    Last attempt to quit: 12/01/2001    Years since quitting: 16.2  . Smokeless tobacco: Never Used  . Tobacco comment: 2ppd x 43 years.  Still uses nicotine gum. Quit smoking 13 years ago  Substance and Sexual Activity  . Alcohol use: No    Alcohol/week: 0.0 oz  . Drug use: No  . Sexual activity: Not on file  Lifestyle  . Physical activity:    Days per week: Not on file    Minutes per session: Not on file  . Stress: Not on file    Relationships  . Social connections:    Talks on phone: Not on file    Gets together: Not on file    Attends religious service: Not on file    Active member of club or organization: Not on file    Attends meetings of clubs or organizations: Not on file    Relationship status: Not on file  Other Topics Concern  . Not on file  Social History Narrative   Originally from [place]. Moved to [place] with job relocation in 1996. Married. Previously also lived in [place]. No international travel. 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 Currently has a dog. No bird exposure. No mold  exposure. No hot tub exposure. Does wood working with domestic woods.      Family History: The patient's family history includes Allergies in his sister; Lung cancer in his father; Prostate cancer in his father.  ROS:   Please see the history of present illness.    All other systems reviewed and are negative.  EKGs/Labs/Other Studies Reviewed:    The following studies were reviewed today: EKG reveals sinus rhythm and nonspecific ST-T changes.   Recent Labs: No results found for requested labs within last 8760 hours.  Recent Lipid Panel No results found for: CHOL, TRIG, HDL, CHOLHDL, VLDL, LDLCALC, LDLDIRECT  Physical Exam:    VS:  BP (!) 150/90 (BP Location: Right Arm, Patient Position: Sitting, Cuff Size: Normal)   Pulse 77   Ht 6\' 1"  (1.854 m)   Wt 223 lb 12.8 oz (101.5 kg)   SpO2 98%   BMI 29.53 kg/m     Wt Readings from Last 3 Encounters:  03/11/18 223 lb 12.8 oz (101.5 kg)  08/19/17 208 lb (94.3 kg)  02/12/17 184 lb (83.5 kg)     GEN: Patient is in no acute distress HEENT: Normal NECK: No JVD; No carotid bruits LYMPHATICS: No lymphadenopathy CARDIAC: S1 S2 regular, 2/6 systolic murmur at the apex. RESPIRATORY:  Clear to auscultation without rales, wheezing or rhonchi  ABDOMEN: Soft, non-tender, non-distended MUSCULOSKELETAL:  No edema; No deformity  SKIN: Warm and  dry NEUROLOGIC:  Alert and oriented x 3 PSYCHIATRIC:  Normal affect    Signed, Jenean Lindau, MD  03/11/2018 11:25 AM    [place]

## 2018-03-17 DIAGNOSIS — M8580 Other specified disorders of bone density and structure, unspecified site: Secondary | ICD-10-CM | POA: Diagnosis not present

## 2018-03-17 DIAGNOSIS — R972 Elevated prostate specific antigen [PSA]: Secondary | ICD-10-CM | POA: Diagnosis not present

## 2018-03-17 DIAGNOSIS — E291 Testicular hypofunction: Secondary | ICD-10-CM | POA: Diagnosis not present

## 2018-03-18 DIAGNOSIS — Z79891 Long term (current) use of opiate analgesic: Secondary | ICD-10-CM | POA: Diagnosis not present

## 2018-03-18 DIAGNOSIS — M150 Primary generalized (osteo)arthritis: Secondary | ICD-10-CM | POA: Diagnosis not present

## 2018-03-18 DIAGNOSIS — G894 Chronic pain syndrome: Secondary | ICD-10-CM | POA: Diagnosis not present

## 2018-03-22 ENCOUNTER — Telehealth (HOSPITAL_COMMUNITY): Payer: Self-pay | Admitting: *Deleted

## 2018-03-22 NOTE — Telephone Encounter (Signed)
Spoke with Patient's wife and she was given detailed instructions per Myocardial Perfusion Study Information Sheet for the test on 03/24/18 at 1000. Patient notified to arrive 15 minutes early and that it is imperative to arrive on time for appointment to keep from having the test rescheduled.  If you need to cancel or reschedule your appointment, please call the office within 24 hours of your appointment. . Patient verbalized understanding.Lajune Perine, [place]

## 2018-03-23 ENCOUNTER — Ambulatory Visit: Payer: Medicare Other | Admitting: Cardiology

## 2018-03-24 ENCOUNTER — Telehealth: Payer: Self-pay

## 2018-03-24 ENCOUNTER — Other Ambulatory Visit: Payer: Self-pay

## 2018-03-24 ENCOUNTER — Ambulatory Visit (HOSPITAL_BASED_OUTPATIENT_CLINIC_OR_DEPARTMENT_OTHER): Payer: Medicare Other

## 2018-03-24 ENCOUNTER — Ambulatory Visit (HOSPITAL_COMMUNITY): Payer: Medicare Other | Attending: Cardiology

## 2018-03-24 DIAGNOSIS — I10 Essential (primary) hypertension: Secondary | ICD-10-CM

## 2018-03-24 DIAGNOSIS — Z87891 Personal history of nicotine dependence: Secondary | ICD-10-CM | POA: Diagnosis not present

## 2018-03-24 DIAGNOSIS — I119 Hypertensive heart disease without heart failure: Secondary | ICD-10-CM | POA: Diagnosis not present

## 2018-03-24 DIAGNOSIS — E039 Hypothyroidism, unspecified: Secondary | ICD-10-CM | POA: Insufficient documentation

## 2018-03-24 DIAGNOSIS — R0602 Shortness of breath: Secondary | ICD-10-CM | POA: Insufficient documentation

## 2018-03-24 LAB — MYOCARDIAL PERFUSION IMAGING
CHL CUP MPHR: 147 {beats}/min
CHL CUP NUCLEAR SDS: 2
CHL CUP RESTING HR STRESS: 60 {beats}/min
CSEPEDS: 16 s
CSEPHR: 89 %
CSEPPHR: 131 {beats}/min
Estimated workload: 8.9 METS
Exercise duration (min): 7 min
LHR: 0.24
LVDIAVOL: 120 mL (ref 62–150)
LVSYSVOL: 53 mL
RPE: 18
SRS: 1
SSS: 3
TID: 0.75

## 2018-03-24 MED ORDER — TECHNETIUM TC 99M TETROFOSMIN IV KIT
31.7000 | PACK | Freq: Once | INTRAVENOUS | Status: AC | PRN
  Administered 2018-03-24: 31.7 via INTRAVENOUS
  Filled 2018-03-24: qty 32

## 2018-03-24 MED ORDER — TECHNETIUM TC 99M TETROFOSMIN IV KIT
10.9000 | PACK | Freq: Once | INTRAVENOUS | Status: AC | PRN
  Administered 2018-03-24: 10.9 via INTRAVENOUS
  Filled 2018-03-24: qty 11

## 2018-03-24 NOTE — Telephone Encounter (Signed)
Left voicemail for the patient to call the office to discuss cardiac testing.

## 2018-03-29 ENCOUNTER — Encounter: Payer: Self-pay | Admitting: Cardiology

## 2018-03-29 ENCOUNTER — Ambulatory Visit (INDEPENDENT_AMBULATORY_CARE_PROVIDER_SITE_OTHER): Payer: Medicare Other | Admitting: Cardiology

## 2018-03-29 VITALS — BP 128/72 | HR 81 | Ht 73.0 in | Wt 221.0 lb

## 2018-03-29 DIAGNOSIS — R0789 Other chest pain: Secondary | ICD-10-CM | POA: Diagnosis not present

## 2018-03-29 DIAGNOSIS — I10 Essential (primary) hypertension: Secondary | ICD-10-CM | POA: Diagnosis not present

## 2018-03-29 DIAGNOSIS — R931 Abnormal findings on diagnostic imaging of heart and coronary circulation: Secondary | ICD-10-CM

## 2018-03-29 DIAGNOSIS — Z87891 Personal history of nicotine dependence: Secondary | ICD-10-CM

## 2018-03-29 MED ORDER — ASPIRIN EC 81 MG PO TBEC: 81.0000 mg | DELAYED_RELEASE_TABLET | Freq: Every day | ORAL | 3 refills | Status: DC

## 2018-03-29 MED ORDER — NITROGLYCERIN 0.4 MG SL SUBL: 0.4000 mg | SUBLINGUAL_TABLET | SUBLINGUAL | 11 refills | Status: DC | PRN

## 2018-03-29 NOTE — Progress Notes (Signed)
Cardiology Office Note:    Date:  03/29/2018   ID:  [name], DOB 08-Mar-1946, MRN [id number]  PCP:  Carol Ada, MD  Cardiologist:  Jenean Lindau, MD   Referring MD: Carol Ada, MD    ASSESSMENT:    1. Chest tightness   2. Abnormal nuclear cardiac imaging test   3. Essential hypertension   4. Ex-cigarette smoker    PLAN:    In order of problems listed above:  1. The patient's stress test results are significantly abnormal.  Symptoms are concerning.  Sublingual nitroglycerin prescription was sent, its protocol and 911 protocol explained and the patient vocalized understanding questions were answered to the patient's satisfaction 2. I discussed coronary angiography and left heart catheterization with the patient at extensive length. Procedure, benefits and potential risks were explained. Patient had multiple questions which were answered to the patient's satisfaction. Patient agreed and consented for the procedure. Further recommendations will be made based on the findings of the coronary angiography. In the interim. The patient has any significant symptoms he knows to go to the nearest emergency room.    Medication Adjustments/Labs and Tests Ordered: Current medicines are reviewed at length with the patient today.  Concerns regarding medicines are outlined above.  Orders Placed This Encounter  Procedures  . Basic metabolic panel  . CBC with Differential/Platelet   No orders of the defined types were placed in this encounter.    Chief Complaint  Patient presents with  . Follow-up  . Abnormal Nuclear     History of Present Illness:    [name] is a 72 y.o. male.  The patient mentions to me that he has chest tightness on exertion.  He says it is not much of chest pain.  He has had chronic shortness of breath.  No orthopnea or PND.  His stress test is inconclusive and therefore he is here for evaluation.  At the time of my evaluation, the patient is  alert awake oriented and in no distress.  Past Medical History:  Diagnosis Date  . Anxiety state, unspecified   . Arthritis    osteo arthritis  . Chronic airway obstruction, not elsewhere classified   . Depressive disorder, not elsewhere classified   . GERD (gastroesophageal reflux disease)   . Hoarseness of voice   . Hypertension   . Hypothyroidism   . Osteoporosis   . Reflux gastritis   . S/P radiation therapy 06/14/2015 through 07/30/2015    Glottic larynx 6600 cGy in 33 sessions   . 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 Tobacco chew use     Past Surgical History:  Procedure Laterality Date  . ABDOMINAL SURGERY  1971   terminal ileum removed, ileitis   . Biospy of the Right True Vocal Cord Right 04/23/2015  . CHOLECYSTECTOMY N/A 10/31/2016   Procedure: OPEN CHOLECYSTECTOMY;  Surgeon: Rolm Bookbinder, MD;  Location: [facility];  Service: General;  Laterality: N/A;  . MICROLARYNGOSCOPY Right 04/23/2015   Procedure: MICROLARYNGOSCOPY WITH BIOPSY RIGHT TRUE VOCAL CORD;  Surgeon: Izora Gala, MD;  Location: [facility];  Service: ENT;  Laterality: Right;  . SHOULDER SURGERY    . TONSILLECTOMY     as child    Current Medications: Current Meds  Medication Sig  . albuterol (VENTOLIN HFA) 108 (90 BASE) MCG/ACT inhaler Inhale 2 puffs into the lungs every 6 (six) hours as needed. (Patient taking differently: Inhale 2 puffs into the lungs as needed. )  .  amLODipine (NORVASC) 10 MG tablet Take 10 mg by mouth daily.  . DULERA 100-5 MCG/ACT AERO USE 2 INHALATIONS FIRST THING IN THE MORNING AND THEN ANOTHER 2 INHALATIONS ABOUT 12 HOURS LATER  . DULoxetine (CYMBALTA) 30 MG capsule Take 3 capsules by mouth daily.  [name] Kitchen levothyroxine (SYNTHROID, LEVOTHROID) 200 MCG tablet Take 200 mcg by mouth daily before breakfast.   . oxyCODONE-acetaminophen (PERCOCET) 10-325 MG tablet Take 2 tablets by  mouth 4 (four) times daily.  . pantoprazole (PROTONIX) 20 MG tablet Take 20 mg by mouth daily.  [name] Kitchen testosterone cypionate (DEPOTESTOTERONE CYPIONATE) 200 MG/ML injection Inject 0.5 mLs into the muscle once a week.  . Zoledronic Acid (ZOMETA IV) Inject into the vein every 3 (three) months.     Allergies:   Doxycycline; Guaifenesin; Morphine sulfate; and Tapentadol   Social History   Socioeconomic History  . Marital status: Married    Spouse name: Not on file  . Number of children: Not on file  . Years of education: Not on file  . Highest education level: Not on file  Occupational History  . Not on file  Social Needs  . Financial resource strain: Not on file  . Food insecurity:    Worry: Not on file    Inability: Not on file  . Transportation needs:    Medical: Not on file    Non-medical: Not on file  Tobacco Use  . Smoking status: Former Smoker    Packs/day: 2.00    Years: 43.00    Pack years: 86.00    Types: Cigarettes    Last attempt to quit: 12/01/2001    Years since quitting: 16.3  . Smokeless tobacco: Never Used  . Tobacco comment: 2ppd x 43 years.  Still uses nicotine gum. Quit smoking 13 years ago  Substance and Sexual Activity  . Alcohol use: No    Alcohol/week: 0.0 oz  . Drug use: No  . Sexual activity: Not on file  Lifestyle  . Physical activity:    Days per week: Not on file    Minutes per session: Not on file  . Stress: Not on file  Relationships  . Social connections:    Talks on phone: Not on file    Gets together: Not on file    Attends religious service: Not on file    Active member of club or organization: Not on file    Attends meetings of clubs or organizations: Not on file    Relationship status: Not on file  Other Topics Concern  . Not on file  Social History Narrative   Originally from [place]. Moved to [place] with job relocation in 1996. Married. Previously also lived in [place]. No international travel. Previously worked as an Cabin crew. Remote  exposure to asbestos. Currently has a dog. No bird exposure. No mold exposure. No hot tub exposure. Does wood working with domestic woods.      Family History: The patient's family history includes Allergies in his sister; Lung cancer in his father; Prostate cancer in his father.  ROS:   Please see the history of present illness.    All other systems reviewed and are negative.  EKGs/Labs/Other Studies Reviewed:    The following studies were reviewed today: Stress test report was discussed with the patient at extensive length and questions were answered to their satisfaction.   Recent Labs: No results found for requested labs within last 8760 hours.  Recent Lipid Panel No results found for: CHOL, TRIG,  HDL, CHOLHDL, VLDL, LDLCALC, LDLDIRECT  Physical Exam:    VS:  BP 128/72 (BP Location: Left Arm, Patient Position: Sitting, Cuff Size: Normal)   Pulse 81   Ht 6\' 1"  (1.854 m)   Wt 221 lb (100.2 kg)   SpO2 98%   BMI 29.16 kg/m     Wt Readings from Last 3 Encounters:  03/29/18 221 lb (100.2 kg)  03/11/18 223 lb 12.8 oz (101.5 kg)  08/19/17 208 lb (94.3 kg)     GEN: Patient is in no acute distress HEENT: Normal NECK: No JVD; No carotid bruits LYMPHATICS: No lymphadenopathy CARDIAC: Hear sounds regular, 2/6 systolic murmur at the apex. RESPIRATORY:  Clear to auscultation without rales, wheezing or rhonchi  ABDOMEN: Soft, non-tender, non-distended MUSCULOSKELETAL:  No edema; No deformity  SKIN: Warm and dry NEUROLOGIC:  Alert and oriented x 3 PSYCHIATRIC:  Normal affect   Signed, Jenean Lindau, MD  03/29/2018 11:50 AM    [place]

## 2018-03-29 NOTE — Patient Instructions (Signed)
Medication Instructions:  Your physician has recommended you make the following change in your medication:  START Nitroglycerin 0.4 mg sublingual (under your tongue) as needed for chest pain. If experiencing chest pain, stop what you are doing and sit down. Take 1 nitroglycerin and wait 5 minutes. If chest pain continues, take another nitroglycerin and wait 5 minutes. If chest pain does not subside, take 1 more nitroglycerin and dial 911. You make take a total of 3 nitroglycerin in a 15 minute time frame. START 81 mg of enteric coated baby aspirin daily.  Labwork: Your physician recommends that you have the following labs drawn: CBC and BMP  Testing/Procedures:   [facility] [address], [place] [facility] [place] [address] Dept: [place]: [phone]  [name]  03/29/2018  You are scheduled for a Cardiac Catheterization on Wednesday, May 1 with Dr. Shelva Majestic.  1. Please arrive at the [facility] (Main Entrance A) at [facility]: [place], [place] [address] at 9:00 AM (two hours before your procedure to ensure your preparation). Free valet parking service is available.   Special note: Every effort is made to have your procedure done on time. Please understand that emergencies sometimes delay scheduled procedures.  2. Diet: You may have a clear liquid breakfast before 5 AM the morning of the cath.  3. Labs: Done today.  4. Medication instructions in preparation for your procedure:   On the morning of your procedure, take your Aspirin and any morning medicines NOT listed above.  You may use sips of water.  5. Plan for one night stay--bring personal belongings. 6. Bring a current list of your medications and current insurance cards. 7. You MUST have a responsible person to drive you home. 8. Someone MUST be with you the first 24 hours after you arrive home or your  discharge will be delayed. 9. Please wear clothes that are easy to get on and off and wear slip-on shoes.  Thank you for allowing [place] to care for you!   -- [facility]   Follow-Up: Your physician recommends that you schedule a follow-up appointment in: 1 month  Any Other Special Instructions Will Be Listed Below (If Applicable).     If you need a refill on your cardiac medications before your next appointment, please call your pharmacy.   [place], RN, BSN  Nitroglycerin sublingual tablets What is this medicine? NITROGLYCERIN (nye troe GLI ser in) is a type of vasodilator. It relaxes blood vessels, increasing the blood and oxygen supply to your heart. This medicine is used to relieve chest pain caused by angina. It is also used to prevent chest pain before activities like climbing stairs, going outdoors in cold weather, or sexual activity. This medicine may be used for other purposes; ask your health care provider or pharmacist if you have questions. COMMON BRAND NAME(S): Nitroquick, Nitrostat, Nitrotab What should I tell my health care provider before I take this medicine? They need to know if you have any of these conditions: -anemia -head injury, recent stroke, or bleeding in the brain -liver disease -previous heart attack -an unusual or allergic reaction to nitroglycerin, other medicines, foods, dyes, or preservatives -pregnant or trying to get pregnant -breast-feeding How should I use this medicine? Take this medicine by mouth as needed. At the first sign of an angina attack (chest pain or tightness) place one  tablet under your tongue. You can also take this medicine 5 to 10 minutes before an event likely to produce chest pain. Follow the directions on the prescription label. Let the tablet dissolve under the tongue. Do not swallow whole. Replace the dose if you accidentally swallow it. It will help if your mouth is not dry.  Saliva around the tablet will help it to dissolve more quickly. Do not eat or drink, smoke or chew tobacco while a tablet is dissolving. If you are not better within 5 minutes after taking ONE dose of nitroglycerin, call 9-1-1 immediately to seek emergency medical care. Do not take more than 3 nitroglycerin tablets over 15 minutes. If you take this medicine often to relieve symptoms of angina, your doctor or health care professional may provide you with different instructions to manage your symptoms. If symptoms do not go away after following these instructions, it is important to call 9-1-1 immediately. Do not take more than 3 nitroglycerin tablets over 15 minutes. Talk to your pediatrician regarding the use of this medicine in children. Special care may be needed. Overdosage: If you think you have taken too much of this medicine contact a poison control center or emergency room at once. NOTE: This medicine is only for you. Do not share this medicine with others. What if I miss a dose? This does not apply. This medicine is only used as needed. What may interact with this medicine? Do not take this medicine with any of the following medications: -certain migraine medicines like ergotamine and dihydroergotamine (DHE) -medicines used to treat erectile dysfunction like sildenafil, tadalafil, and vardenafil -riociguat This medicine may also interact with the following medications: -alteplase -aspirin -heparin -medicines for high blood pressure -medicines for mental depression -other medicines used to treat angina -phenothiazines like chlorpromazine, mesoridazine, prochlorperazine, thioridazine This list may not describe all possible interactions. Give your health care provider a list of all the medicines, herbs, non-prescription drugs, or dietary supplements you use. Also tell them if you smoke, drink alcohol, or use illegal drugs. Some items may interact with your medicine. What should I watch for  while using this medicine? Tell your doctor or health care professional if you feel your medicine is no longer working. Keep this medicine with you at all times. Sit or lie down when you take your medicine to prevent falling if you feel dizzy or faint after using it. Try to remain calm. This will help you to feel better faster. If you feel dizzy, take several deep breaths and lie down with your feet propped up, or bend forward with your head resting between your knees. You may get drowsy or dizzy. Do not drive, use machinery, or do anything that needs mental alertness until you know how this drug affects you. Do not stand or sit up quickly, especially if you are an older patient. This reduces the risk of dizzy or fainting spells. Alcohol can make you more drowsy and dizzy. Avoid alcoholic drinks. Do not treat yourself for coughs, colds, or pain while you are taking this medicine without asking your doctor or health care professional for advice. Some ingredients may increase your blood pressure. What side effects may I notice from receiving this medicine? Side effects that you should report to your doctor or health care professional as soon as possible: -blurred vision -dry mouth -skin rash -sweating -the feeling of extreme pressure in the head -unusually weak or tired Side effects that usually do not require medical attention (report to your  doctor or health care professional if they continue or are bothersome): -flushing of the face or neck -headache -irregular heartbeat, palpitations -nausea, vomiting This list may not describe all possible side effects. Call your doctor for medical advice about side effects. You may report side effects to FDA at 1-800-FDA-1088. Where should I keep my medicine? Keep out of the reach of children. Store at room temperature between 20 and 25 degrees C (68 and 77 degrees F). Store in [facility]. Protect from light and moisture. Keep tightly closed. Throw  away any unused medicine after the expiration date. NOTE: This sheet is a summary. It may not cover all possible information. If you have questions about this medicine, talk to your doctor, pharmacist, or health care provider.  2018 Elsevier/Gold Standard (2013-09-15 17:57:36)  Aspirin and Your Heart Aspirin is a medicine that affects the way blood clots. Aspirin can be used to help reduce the risk of blood clots, heart attacks, and other heart-related problems. Should I take aspirin? Your health care provider will help you determine whether it is safe and beneficial for you to take aspirin daily. Taking aspirin daily may be beneficial if you:  Have had a heart attack or chest pain.  Have undergone open heart surgery such as coronary artery bypass surgery (CABG).  Have had coronary angioplasty.  Have experienced a stroke or transient ischemic attack (TIA).  Have peripheral vascular disease (PVD).  Have chronic heart rhythm problems such as atrial fibrillation.  Are there any risks of taking aspirin daily? Daily use of aspirin can increase your risk of side effects. Some of these include:  Bleeding. Bleeding problems can be minor or serious. An example of a minor problem is a cut that does not stop bleeding. An example of a more serious problem is stomach bleeding or bleeding into the brain. Your risk of bleeding is increased if you are also taking non-steroidal anti-inflammatory medicine (NSAIDs).  Increased bruising.  Upset stomach.  An allergic reaction. People who have nasal polyps have an increased risk of developing an aspirin allergy.  What are some guidelines I should follow when taking aspirin?  Take aspirin only as directed by your health care provider. Make sure you understand how much you should take and what form you should take. The two forms of aspirin are: ? Non-enteric-coated. This type of aspirin does not have a coating and is absorbed quickly. Non-enteric-coated  aspirin is usually recommended for people with chest pain. This type of aspirin also comes in a chewable form. ? Enteric-coated. This type of aspirin has a special coating that releases the medicine very slowly. Enteric-coated aspirin causes less stomach upset than non-enteric-coated aspirin. This type of aspirin should not be chewed or crushed.  Drink alcohol in moderation. Drinking alcohol increases your risk of bleeding. When should I seek medical care?  You have unusual bleeding or bruising.  You have stomach pain.  You have an allergic reaction. Symptoms of an allergic reaction include: ? Hives. ? Itchy skin. ? Swelling of the lips, tongue, or face.  You have ringing in your ears. When should I seek immediate medical care?  Your bowel movements are bloody, dark red, or black in color.  You vomit or cough up blood.  You have blood in your urine.  You cough, wheeze, or feel short of breath. If you have any of the following symptoms, this is an emergency. Do not wait to see if the pain will go away. Get medical help at once.  Call your local emergency services (911 in the [place] Do not drive yourself to the hospital.  You have severe chest pain, especially if the pain is crushing or pressure-like and spreads to the arms, back, neck, or jaw.  You have stroke-like symptoms, such as: ? Loss of vision. ? Difficulty talking. ? Numbness or weakness on one side of your body. ? Numbness or weakness in your arm or leg. ? Not thinking clearly or feeling confused.  This information is not intended to replace advice given to you by your health care provider. Make sure you discuss any questions you have with your health care provider. Document Released: 10/30/2008 Document Revised: 03/26/2016 Document Reviewed: 02/22/2014 Elsevier Interactive Patient Education  2018 [employer].  Coronary Angiogram With Stent Coronary angiogram with stent placement is a procedure to widen or open a  narrow blood vessel of the heart (coronary artery). Arteries may become blocked by cholesterol buildup (plaques) in the lining of the wall. When a coronary artery becomes partially blocked, blood flow to that area decreases. This may lead to chest pain or a heart attack (myocardial infarction). A stent is a small piece of metal that looks like mesh or a spring. Stent placement may be done as treatment for a heart attack or right after a coronary angiogram in which a blocked artery is found. Let your health care provider know about:  Any allergies you have.  All medicines you are taking, including vitamins, herbs, eye drops, creams, and over-the-counter medicines.  Any problems you or family members have had with anesthetic medicines.  Any blood disorders you have.  Any surgeries you have had.  Any medical conditions you have.  Whether you are pregnant or may be pregnant. What are the risks? Generally, this is a safe procedure. However, problems may occur, including:  Damage to the heart or its blood vessels.  A return of blockage.  Bleeding, infection, or bruising at the insertion site.  A collection of blood under the skin (hematoma) at the insertion site.  A blood clot in another part of the body.  Kidney injury.  Allergic reaction to the dye or contrast that is used.  Bleeding into the abdomen (retroperitoneal bleeding).  What happens before the procedure? Staying hydrated Follow instructions from your health care provider about hydration, which may include:  Up to 2 hours before the procedure - you may continue to drink clear liquids, such as water, clear fruit juice, black coffee, and plain tea.  Eating and drinking restrictions Follow instructions from your health care provider about eating and drinking, which may include:  8 hours before the procedure - stop eating heavy meals or foods such as meat, fried foods, or fatty foods.  6 hours before the procedure -  stop eating light meals or foods, such as toast or cereal.  2 hours before the procedure - stop drinking clear liquids.  Ask your health care provider about:  Changing or stopping your regular medicines. This is especially important if you are taking diabetes medicines or blood thinners.  Taking medicines such as ibuprofen. These medicines can thin your blood. Do not take these medicines before your procedure if your health care provider instructs you not to. Generally, aspirin is recommended before a procedure of passing a small, thin tube (catheter) through a blood vessel and into the heart (cardiac catheterization).  What happens during the procedure?  An IV tube will be inserted into one of your veins.  You will be given one  or more of the following: ? A medicine to help you relax (sedative). ? A medicine to numb the area where the catheter will be inserted into an artery (local anesthetic).  To reduce your risk of infection: ? Your health care team will wash or sanitize their hands. ? Your skin will be washed with soap. ? Hair may be removed from the area where the catheter will be inserted.  Using a guide wire, the catheter will be inserted into an artery. The location may be in your groin, in your wrist, or in the fold of your arm (near your elbow).  A type of X-ray (fluoroscopy) will be used to help guide the catheter to the opening of the arteries in the heart.  A dye will be injected into the catheter, and X-rays will be taken. The dye will help to show where any narrowing or blockages are located in the arteries.  A tiny wire will be guided to the blocked spot, and a balloon will be inflated to make the artery wider.  The stent will be expanded and will crush the plaques into the wall of the vessel. The stent will hold the area open and improve the blood flow. Most stents have a drug coating to reduce the risk of the stent narrowing over time.  The artery may be made wider  using a drill, laser, or other tools to remove plaques.  When the blood flow is better, the catheter will be removed. The lining of the artery will grow over the stent, which stays where it was placed. This procedure may vary among health care providers and hospitals. What happens after the procedure?  If the procedure is done through the leg, you will be kept in bed lying flat for about 6 hours. You will be instructed to not bend and not cross your legs.  The insertion site will be checked frequently.  The pulse in your foot or wrist will be checked frequently.  You may have additional blood tests, X-rays, and a test that records the electrical activity of your heart (electrocardiogram, or ECG). This information is not intended to replace advice given to you by your health care provider. Make sure you discuss any questions you have with your health care provider. Document Released: 05/24/2003 Document Revised: 07/17/2016 Document Reviewed: 06/22/2016 Elsevier Interactive Patient Education  [employer].

## 2018-03-30 ENCOUNTER — Telehealth: Payer: Self-pay | Admitting: *Deleted

## 2018-03-30 LAB — CBC WITH DIFFERENTIAL/PLATELET
BASOS ABS: 0 10*3/uL (ref 0.0–0.2)
Basos: 0 %
EOS (ABSOLUTE): 0.9 10*3/uL — ABNORMAL HIGH (ref 0.0–0.4)
Eos: 13 %
HEMOGLOBIN: 13.2 g/dL (ref 13.0–17.7)
Hematocrit: 39.9 % (ref 37.5–51.0)
Immature Grans (Abs): 0 10*3/uL (ref 0.0–0.1)
Immature Granulocytes: 0 %
LYMPHS ABS: 1.5 10*3/uL (ref 0.7–3.1)
LYMPHS: 23 %
MCH: 28.8 pg (ref 26.6–33.0)
MCHC: 33.1 g/dL (ref 31.5–35.7)
MCV: 87 fL (ref 79–97)
MONOCYTES: 11 %
Monocytes Absolute: 0.7 10*3/uL (ref 0.1–0.9)
Neutrophils Absolute: 3.5 10*3/uL (ref 1.4–7.0)
Neutrophils: 53 %
Platelets: 201 10*3/uL (ref 150–379)
RBC: 4.59 x10E6/uL (ref 4.14–5.80)
RDW: 15.8 % — ABNORMAL HIGH (ref 12.3–15.4)
WBC: 6.5 10*3/uL (ref 3.4–10.8)

## 2018-03-30 LAB — BASIC METABOLIC PANEL
BUN / CREAT RATIO: 11 (ref 10–24)
BUN: 12 mg/dL (ref 8–27)
CALCIUM: 10.1 mg/dL (ref 8.6–10.2)
CHLORIDE: 103 mmol/L (ref 96–106)
CO2: 24 mmol/L (ref 20–29)
Creatinine, Ser: 1.08 mg/dL (ref 0.76–1.27)
GFR calc Af Amer: 78 mL/min/{1.73_m2} (ref >59)
GFR calc non Af Amer: 68 mL/min/{1.73_m2} (ref >59)
Glucose: 88 mg/dL (ref 65–99)
Potassium: 4.6 mmol/L (ref 3.5–5.2)
Sodium: 142 mmol/L (ref 134–144)

## 2018-03-30 NOTE — Telephone Encounter (Signed)
Pt contacted pre-catheterization scheduled at [facility] for: Wednesday Mar 31, 2018 11:30 AM Verified arrival time and place: [facility] at: 9 AM No solid food after midnight prior to cath, clear liquids until 5 AM. Verified allergies in Epic. Verified no diabetes medications.  AM meds can be  taken pre-cath with sip of water including: ASA 81 mg  Confirmed patient has responsible person to drive home post procedure and observe patient for 24 hours: yes

## 2018-03-31 ENCOUNTER — Ambulatory Visit (HOSPITAL_COMMUNITY)
Admission: RE | Admit: 2018-03-31 | Discharge: 2018-03-31 | Disposition: A | Discharge status: Home / Self Care | Payer: Medicare Other | Source: Ambulatory Visit | Attending: Cardiovascular Disease | Admitting: Cardiovascular Disease

## 2018-03-31 ENCOUNTER — Ambulatory Visit (HOSPITAL_COMMUNITY)
Admission: RE | Disposition: A | Discharge status: Home / Self Care | Payer: Self-pay | Source: Ambulatory Visit | Attending: Cardiovascular Disease

## 2018-03-31 DIAGNOSIS — E039 Hypothyroidism, unspecified: Secondary | ICD-10-CM | POA: Insufficient documentation

## 2018-03-31 DIAGNOSIS — R0789 Other chest pain: Secondary | ICD-10-CM | POA: Diagnosis not present

## 2018-03-31 DIAGNOSIS — Z888 Allergy status to other drugs, medicaments and biological substances status: Secondary | ICD-10-CM | POA: Insufficient documentation

## 2018-03-31 DIAGNOSIS — Z881 Allergy status to other antibiotic agents status: Secondary | ICD-10-CM | POA: Insufficient documentation

## 2018-03-31 DIAGNOSIS — I10 Essential (primary) hypertension: Secondary | ICD-10-CM | POA: Diagnosis not present

## 2018-03-31 DIAGNOSIS — Z801 Family history of malignant neoplasm of trachea, bronchus and lung: Secondary | ICD-10-CM | POA: Insufficient documentation

## 2018-03-31 DIAGNOSIS — R931 Abnormal findings on diagnostic imaging of heart and coronary circulation: Secondary | ICD-10-CM | POA: Diagnosis not present

## 2018-03-31 DIAGNOSIS — M810 Age-related osteoporosis without current pathological fracture: Secondary | ICD-10-CM | POA: Diagnosis not present

## 2018-03-31 DIAGNOSIS — Z8521 Personal history of malignant neoplasm of larynx: Secondary | ICD-10-CM | POA: Insufficient documentation

## 2018-03-31 DIAGNOSIS — F419 Anxiety disorder, unspecified: Secondary | ICD-10-CM | POA: Insufficient documentation

## 2018-03-31 DIAGNOSIS — Z923 Personal history of irradiation: Secondary | ICD-10-CM | POA: Diagnosis not present

## 2018-03-31 DIAGNOSIS — Z87891 Personal history of nicotine dependence: Secondary | ICD-10-CM | POA: Diagnosis not present

## 2018-03-31 DIAGNOSIS — Z9049 Acquired absence of other specified parts of digestive tract: Secondary | ICD-10-CM | POA: Insufficient documentation

## 2018-03-31 DIAGNOSIS — R0600 Dyspnea, unspecified: Secondary | ICD-10-CM | POA: Diagnosis not present

## 2018-03-31 DIAGNOSIS — J449 Chronic obstructive pulmonary disease, unspecified: Secondary | ICD-10-CM | POA: Diagnosis not present

## 2018-03-31 DIAGNOSIS — M1990 Unspecified osteoarthritis, unspecified site: Secondary | ICD-10-CM | POA: Diagnosis not present

## 2018-03-31 DIAGNOSIS — Z8042 Family history of malignant neoplasm of prostate: Secondary | ICD-10-CM | POA: Insufficient documentation

## 2018-03-31 DIAGNOSIS — R9439 Abnormal result of other cardiovascular function study: Secondary | ICD-10-CM | POA: Diagnosis not present

## 2018-03-31 DIAGNOSIS — K219 Gastro-esophageal reflux disease without esophagitis: Secondary | ICD-10-CM | POA: Diagnosis not present

## 2018-03-31 DIAGNOSIS — Z885 Allergy status to narcotic agent status: Secondary | ICD-10-CM | POA: Insufficient documentation

## 2018-03-31 DIAGNOSIS — R490 Dysphonia: Secondary | ICD-10-CM | POA: Insufficient documentation

## 2018-03-31 DIAGNOSIS — F329 Major depressive disorder, single episode, unspecified: Secondary | ICD-10-CM | POA: Insufficient documentation

## 2018-03-31 HISTORY — PX: LEFT HEART CATH AND CORONARY ANGIOGRAPHY: CATH118249

## 2018-03-31 SURGERY — LEFT HEART CATH AND CORONARY ANGIOGRAPHY
Anesthesia: LOCAL

## 2018-03-31 MED ORDER — SODIUM CHLORIDE 0.9% FLUSH: 3.0000 mL | Freq: Two times a day (BID) | INTRAVENOUS | Status: DC

## 2018-03-31 MED ORDER — VERAPAMIL HCL 2.5 MG/ML IV SOLN
INTRAVENOUS | Status: DC | PRN
  Administered 2018-03-31: 10 mL via INTRA_ARTERIAL

## 2018-03-31 MED ORDER — SODIUM CHLORIDE 0.9 % IV SOLN: INTRAVENOUS | Status: DC

## 2018-03-31 MED ORDER — LIDOCAINE HCL (PF) 1 % IJ SOLN
INTRAMUSCULAR | Status: AC
  Filled 2018-03-31: qty 30

## 2018-03-31 MED ORDER — IOHEXOL 350 MG/ML SOLN
INTRAVENOUS | Status: DC | PRN
  Administered 2018-03-31: 75 mL via INTRA_ARTERIAL

## 2018-03-31 MED ORDER — SODIUM CHLORIDE 0.9 % WEIGHT BASED INFUSION
3.0000 mL/kg/h | INTRAVENOUS | Status: DC
  Administered 2018-03-31: 3 mL/kg/h via INTRAVENOUS

## 2018-03-31 MED ORDER — SODIUM CHLORIDE 0.9 % WEIGHT BASED INFUSION: 1.0000 mL/kg/h | INTRAVENOUS | Status: DC

## 2018-03-31 MED ORDER — DIAZEPAM 5 MG PO TABS: 5.0000 mg | ORAL_TABLET | Freq: Four times a day (QID) | ORAL | Status: DC | PRN

## 2018-03-31 MED ORDER — SODIUM CHLORIDE 0.9 % IV SOLN: 250.0000 mL | INTRAVENOUS | Status: DC | PRN

## 2018-03-31 MED ORDER — SODIUM CHLORIDE 0.9% FLUSH: 3.0000 mL | INTRAVENOUS | Status: DC | PRN

## 2018-03-31 MED ORDER — HEPARIN (PORCINE) IN NACL 2-0.9 UNITS/ML
INTRAMUSCULAR | Status: AC | PRN
  Administered 2018-03-31 (×2): 500 mL

## 2018-03-31 MED ORDER — HEPARIN SODIUM (PORCINE) 1000 UNIT/ML IJ SOLN
INTRAMUSCULAR | Status: AC
  Filled 2018-03-31: qty 1

## 2018-03-31 MED ORDER — VERAPAMIL HCL 2.5 MG/ML IV SOLN
INTRAVENOUS | Status: AC
  Filled 2018-03-31: qty 2

## 2018-03-31 MED ORDER — FENTANYL CITRATE (PF) 100 MCG/2ML IJ SOLN
INTRAMUSCULAR | Status: DC | PRN
  Administered 2018-03-31: 25 ug via INTRAVENOUS

## 2018-03-31 MED ORDER — ASPIRIN 81 MG PO CHEW: 81.0000 mg | CHEWABLE_TABLET | ORAL | Status: DC

## 2018-03-31 MED ORDER — FENTANYL CITRATE (PF) 100 MCG/2ML IJ SOLN
INTRAMUSCULAR | Status: AC
  Filled 2018-03-31: qty 2

## 2018-03-31 MED ORDER — HEPARIN SODIUM (PORCINE) 1000 UNIT/ML IJ SOLN
INTRAMUSCULAR | Status: DC | PRN
  Administered 2018-03-31: 5000 [IU] via INTRAVENOUS

## 2018-03-31 MED ORDER — ONDANSETRON HCL 4 MG/2ML IJ SOLN: 4.0000 mg | Freq: Four times a day (QID) | INTRAMUSCULAR | Status: DC | PRN

## 2018-03-31 MED ORDER — MIDAZOLAM HCL 2 MG/2ML IJ SOLN
INTRAMUSCULAR | Status: DC | PRN
  Administered 2018-03-31: 2 mg via INTRAVENOUS

## 2018-03-31 MED ORDER — MIDAZOLAM HCL 2 MG/2ML IJ SOLN
INTRAMUSCULAR | Status: AC
  Filled 2018-03-31: qty 2

## 2018-03-31 MED ORDER — ACETAMINOPHEN 325 MG PO TABS: 650.0000 mg | ORAL_TABLET | ORAL | Status: DC | PRN

## 2018-03-31 MED ORDER — HEPARIN (PORCINE) IN NACL 1000-0.9 UT/500ML-% IV SOLN
INTRAVENOUS | Status: AC
  Filled 2018-03-31: qty 1000

## 2018-03-31 MED ORDER — LIDOCAINE HCL (PF) 1 % IJ SOLN
INTRAMUSCULAR | Status: DC | PRN
  Administered 2018-03-31: 2 mL

## 2018-03-31 SURGICAL SUPPLY — 12 items
CATH INFINITI 5FR ANG PIGTAIL (CATHETERS) ×2 IMPLANT
CATH OPTITORQUE TIG 4.0 5F (CATHETERS) ×2 IMPLANT
DEVICE RAD COMP TR BAND LRG (VASCULAR PRODUCTS) ×2 IMPLANT
GUIDEWIRE INQWIRE 1.5J.035X260 (WIRE) ×1 IMPLANT
INQWIRE 1.5J .035X260CM (WIRE) ×2
KIT HEART LEFT (KITS) ×2 IMPLANT
NEEDLE PERC 21GX4CM (NEEDLE) ×2 IMPLANT
PACK CARDIAC CATHETERIZATION (CUSTOM PROCEDURE TRAY) ×2 IMPLANT
SHEATH RAIN RADIAL 21G 6FR (SHEATH) ×2 IMPLANT
SYR MEDRAD MARK V 150ML (SYRINGE) ×2 IMPLANT
TRANSDUCER W/STOPCOCK (MISCELLANEOUS) ×2 IMPLANT
TUBING CIL FLEX 10 FLL-RA (TUBING) ×2 IMPLANT

## 2018-03-31 NOTE — Progress Notes (Signed)
[facility] BAND REMOVAL  LOCATION:    right radial  DEFLATED PER PROTOCOL:    Yes.    TIME BAND OFF / DRESSING APPLIED:    1750   SITE UPON ARRIVAL:    Level 0  SITE AFTER BAND REMOVAL:    Level 0  CIRCULATION SENSATION AND MOVEMENT:    Within Normal Limits   Yes.    COMMENTS:   tegaderm dsg applied

## 2018-03-31 NOTE — Discharge Instructions (Signed)
Drink plenty of fluids over next 48 hours and keep right wrist heart level for 24 hours ° °Radial Site Care °Refer to this sheet in the next few weeks. These instructions provide you with information about caring for yourself after your procedure. Your health care provider may also give you more specific instructions. Your treatment has been planned according to current medical practices, but problems sometimes occur. Call your health care provider if you have any problems or questions after your procedure. °What can I expect after the procedure? °After your procedure, it is typical to have the following: °· Bruising at the radial site that usually fades within 1-2 weeks. °· Blood collecting in the tissue (hematoma) that may be painful to the touch. It should usually decrease in size and tenderness within 1-2 weeks. ° °Follow these instructions at home: °· Take medicines only as directed by your health care provider. °· You may shower 24-48 hours after the procedure or as directed by your health care provider. Remove the bandage (dressing) and gently wash the site with plain soap and water. Pat the area dry with a clean towel. Do not rub the site, because this may cause bleeding. °· Do not take baths, swim, or use a hot tub until your health care provider approves. °· Check your insertion site every day for redness, swelling, or drainage. °· Do not apply powder or lotion to the site. °· Do not flex or bend the affected arm for 24 hours or as directed by your health care provider. °· Do not push or pull heavy objects with the affected arm for 24 hours or as directed by your health care provider. °· Do not lift over 10 lb (4.5 kg) for 5 days after your procedure or as directed by your health care provider. °· Ask your health care provider when it is okay to: °? Return to work or school. °? Resume usual physical activities or sports. °? Resume sexual activity. °· Do not drive home if you are discharged the same day as  the procedure. Have someone else drive you. °· You may drive 24 hours after the procedure unless otherwise instructed by your health care provider. °· Do not operate machinery or power tools for 24 hours after the procedure. °· If your procedure was done as an outpatient procedure, which means that you went home the same day as your procedure, a responsible adult should be with you for the first 24 hours after you arrive home. °· Keep all follow-up visits as directed by your health care provider. This is important. °Contact a health care provider if: °· You have a fever. °· You have chills. °· You have increased bleeding from the radial site. Hold pressure on the site. °Get help right away if: °· You have unusual pain at the radial site. °· You have redness, warmth, or swelling at the radial site. °· You have drainage (other than a small amount of blood on the dressing) from the radial site. °· The radial site is bleeding, and the bleeding does not stop after 30 minutes of holding steady pressure on the site. °· Your arm or hand becomes pale, cool, tingly, or numb. °This information is not intended to replace advice given to you by your health care provider. Make sure you discuss any questions you have with your health care provider. °Document Released: 12/20/2010 Document Revised: 04/24/2016 Document Reviewed: 06/05/2014 °Elsevier Interactive Patient Education © 2018 [employer] ° °

## 2018-04-01 ENCOUNTER — Encounter (HOSPITAL_COMMUNITY): Payer: Self-pay | Admitting: Cardiovascular Disease

## 2018-04-01 MED FILL — Heparin Sod (Porcine)-NaCl IV Soln 1000 Unit/500ML-0.9%: INTRAVENOUS | Qty: 1000 | Status: AC

## 2018-04-09 IMAGING — RF DG ESOPHAGUS
6 series · 17 of 18 positions shown · non-contrast
Comparison: Chest CT from yesterday

CLINICAL DATA: Vomiting. Recurrent aspiration pneumonia. Hiatal
hernia.

EXAM:
ESOPHOGRAM/BARIUM SWALLOW
TECHNIQUE: Single contrast examination was performed using thin barium or water
soluble.
FLUOROSCOPY TIME:  Fluoroscopy Time:  48 seconds.
Radiation Exposure Index (if provided by the fluoroscopic device):
4.6 mGy
Number of Acquired Spot Images: 0

[Series 1: cp_standard · 0.51mm/px · 4 of 28 frames shown (1 of 6)]
[frame 1/28]
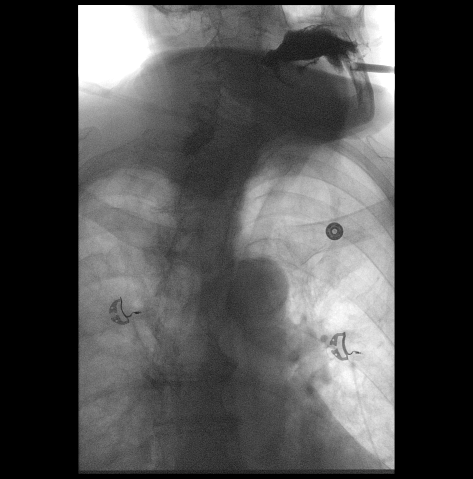
[frame 5/28]
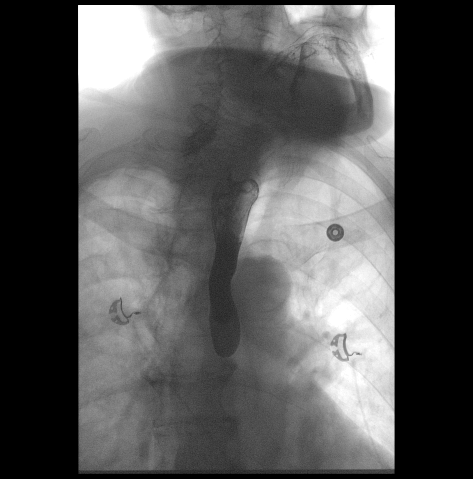
[frame 15/28]
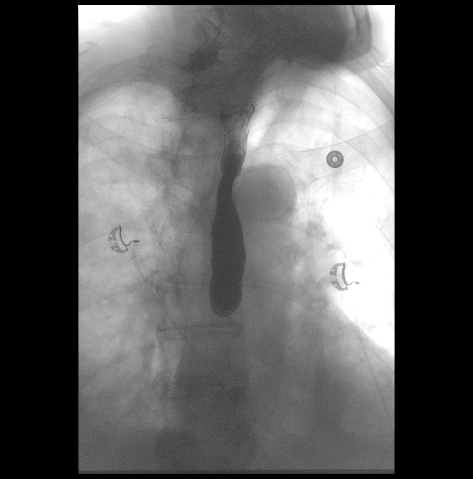
[frame 24/28]
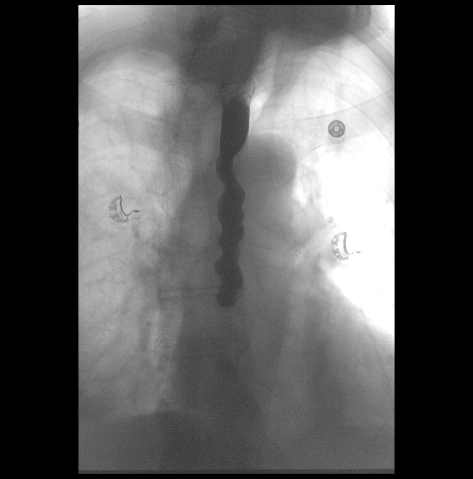

[Series 2: cp_standard · 0.51mm/px · 4 of 18 frames shown (2 of 6)]
[frame 3/18]
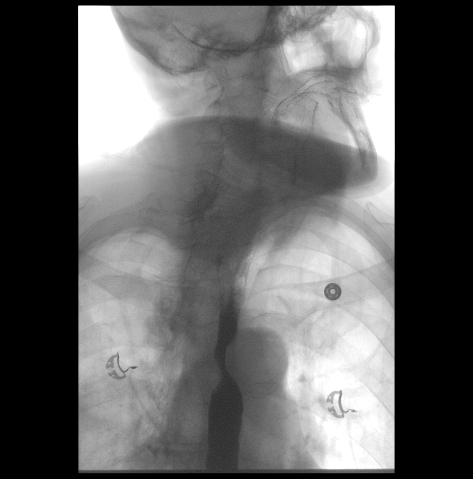
[frame 10/18]
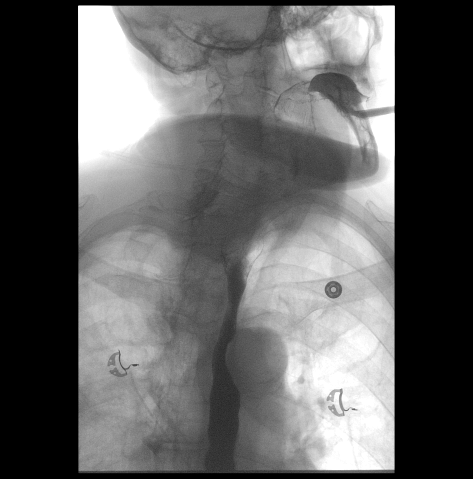
[frame 14/18]
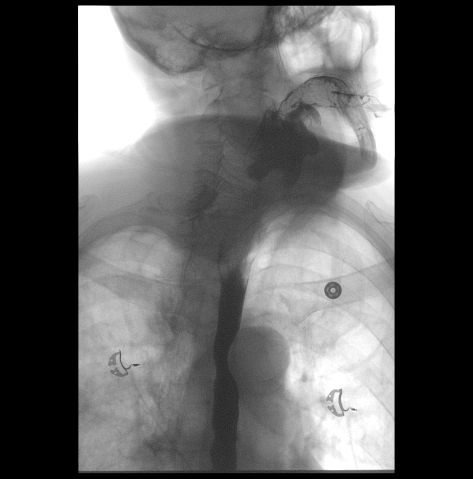
[frame 16/18]
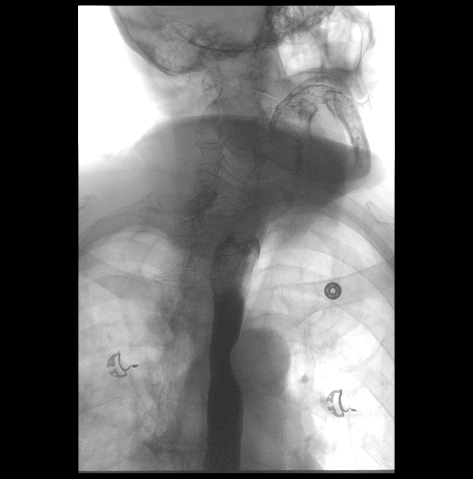

[Series 3: cp_standard · 0.51mm/px · 3 of 14 frames shown (3 of 6)]
[frame 8/14]
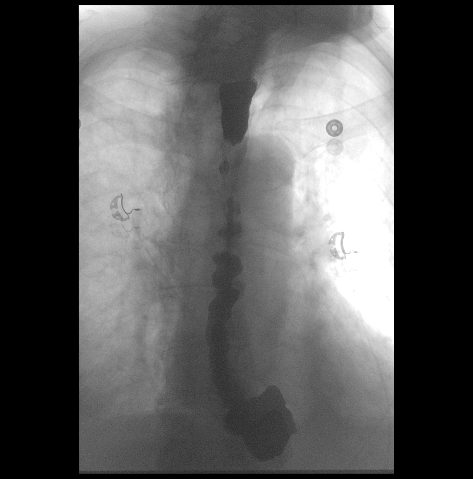
[frame 10/14]
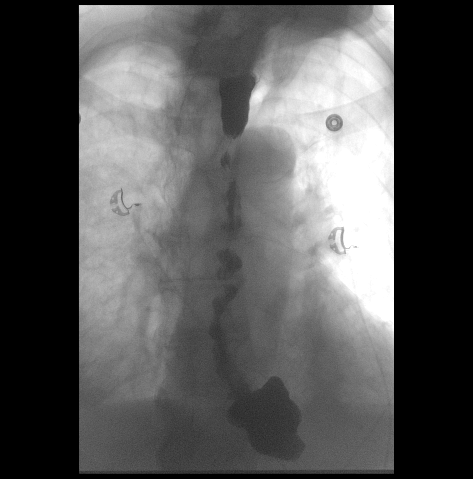
[frame 12/14]
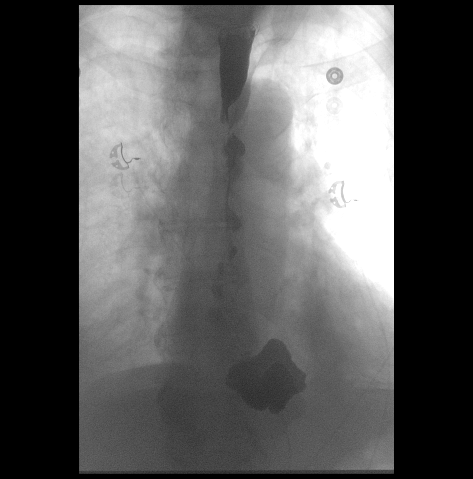

[Series 4: cp_standard · 0.52mm/px · 4 of 14 frames shown (4 of 6)]
[frame 1/14]
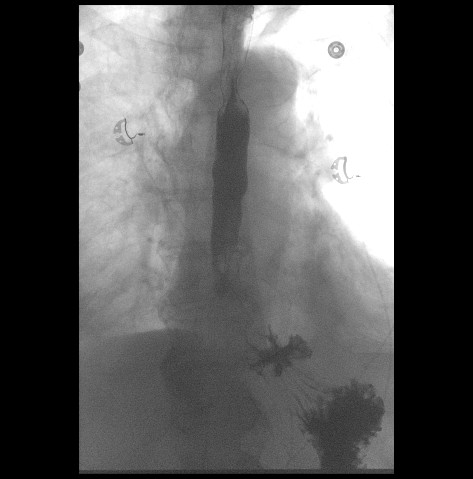
[frame 3/14]
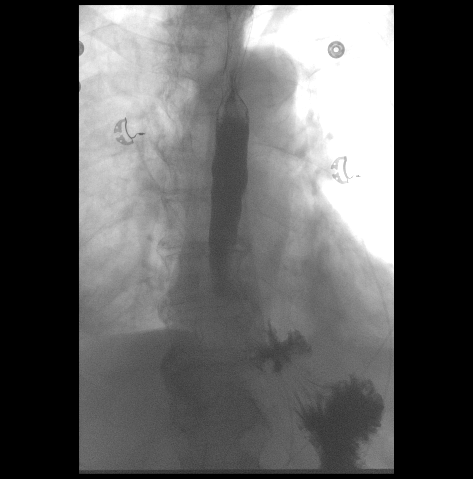
[frame 8/14]
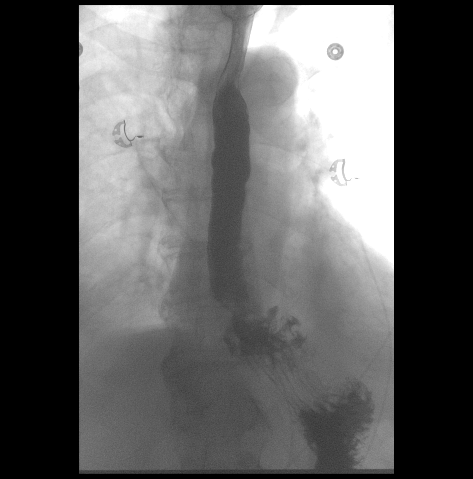
[frame 12/14]
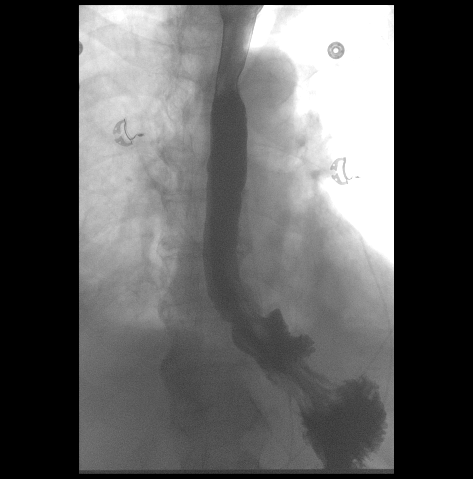

[Series 5: cp_standard · 0.26mm/px · 1 of 1 slices shown (5 of 6)]
[im 1/1]
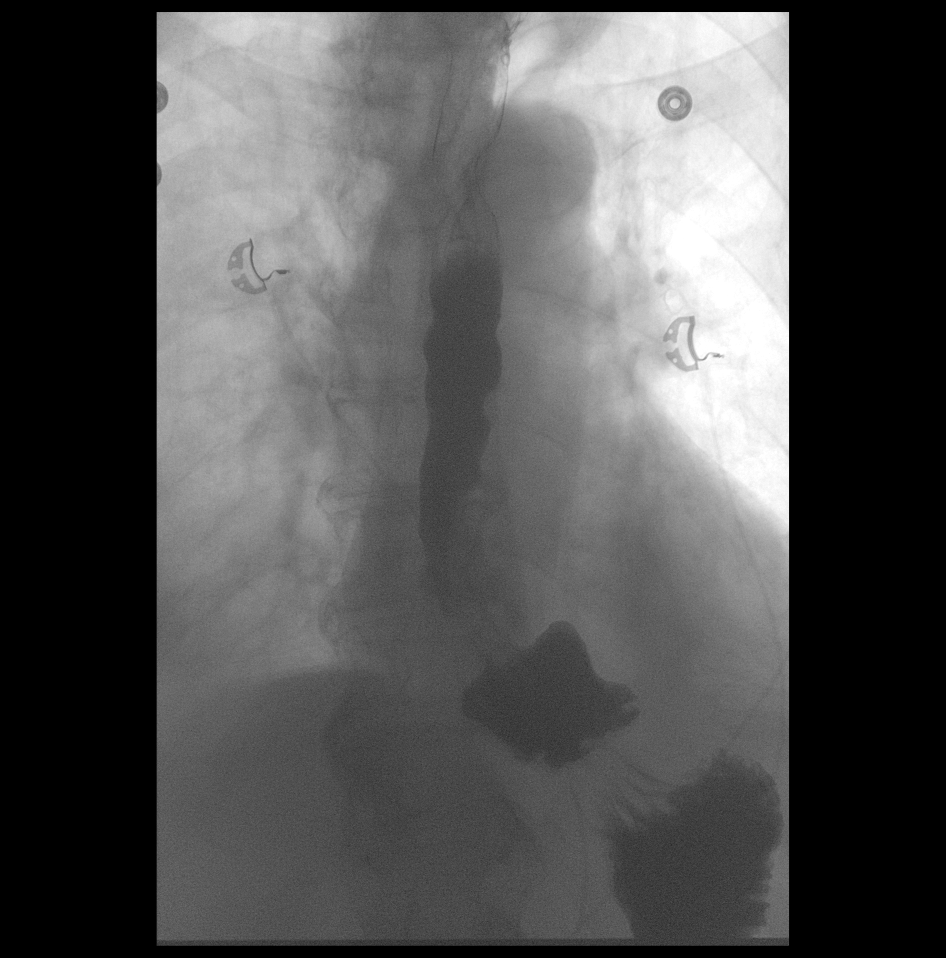

[Series 6: cp_standard · 0.26mm/px · 1 of 1 slices shown (6 of 6)]
[im 1/1]
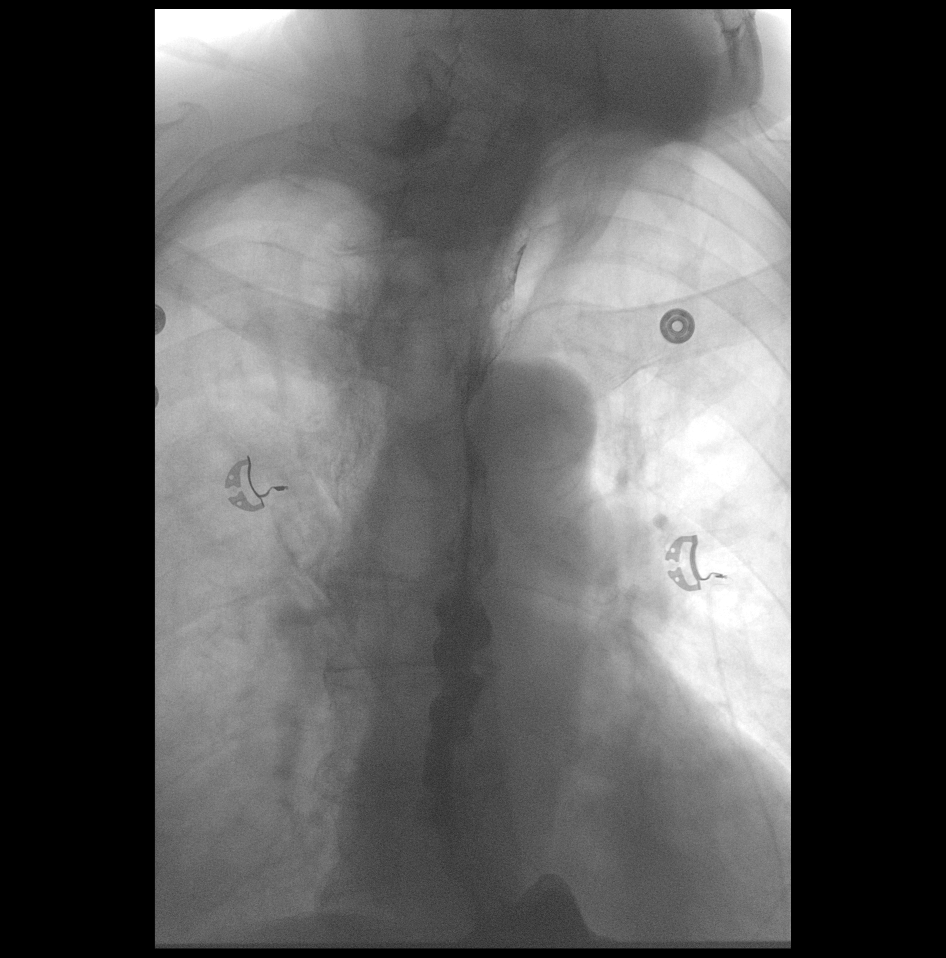

[17 of 18 positions shown; findings below may reference images not displayed]

FINDINGS: Ill patient which had to be imaged semi recumbent in the LPO
position.

Oblique pharyngeal imaging showed no aspiration or obstructive
process.

No stricture noted within the esophagus. No detected mucosal lesion
on this single contrast exam. Small sliding hiatal hernia. There is
nonspecific esophageal dysmotility disorder evidenced by frequent
tertiary contractions and intermittent stasis. The stasis may be
exacerbated by patient positioning. No diverticula or dominant pain
complaint to implicate diffuse esophageal spasm.
IMPRESSION: 1. Small sliding hiatal hernia.
2. Negative for stricture.
3. Nonspecific esophageal dysmotility with frequent stasis and
tertiary contractions.

## 2018-04-28 ENCOUNTER — Ambulatory Visit (INDEPENDENT_AMBULATORY_CARE_PROVIDER_SITE_OTHER): Payer: Medicare Other | Admitting: Cardiology

## 2018-04-28 ENCOUNTER — Encounter: Payer: Self-pay | Admitting: Cardiology

## 2018-04-28 VITALS — BP 124/68 | HR 65 | Ht 75.0 in | Wt 216.4 lb

## 2018-04-28 DIAGNOSIS — R0789 Other chest pain: Secondary | ICD-10-CM | POA: Diagnosis not present

## 2018-04-28 DIAGNOSIS — R931 Abnormal findings on diagnostic imaging of heart and coronary circulation: Secondary | ICD-10-CM | POA: Diagnosis not present

## 2018-04-28 DIAGNOSIS — I10 Essential (primary) hypertension: Secondary | ICD-10-CM | POA: Diagnosis not present

## 2018-04-28 NOTE — Patient Instructions (Signed)
Medication Instructions:   Your physician recommends that you continue on your current medications as directed. Please refer to the Current Medication list given to you today.  Labwork:  None  Testing/Procedures:  None  Follow-Up:  Your physician recommends that you schedule a follow-up appointment in: as needed.  Any Other Special Instructions Will Be Listed Below (If Applicable).  If you need a refill on your cardiac medications before your next appointment, please call your pharmacy. 

## 2018-04-28 NOTE — Progress Notes (Signed)
Cardiology Office Note:    Date:  04/28/2018   ID:  [name], DOB 1946/06/28, MRN [id number]  PCP:  Carol Ada, MD  Cardiologist:  Jenean Lindau, MD   Referring MD: Carol Ada, MD    ASSESSMENT:    1. Essential hypertension   2. Abnormal nuclear cardiac imaging test   3. Chest tightness    PLAN:    In order of problems listed above:  1. Primary prevention stressed with the patient.  Importance of compliance with diet and medications stressed and he vocalized understanding. 2. His blood pressure stable.  Exercise was highlighted to him at length and he plans to be active and lose some weight. 3. He will be seen in follow-up appointment on a as needed basis only.  He had multiple questions especially pertaining to coronary angiography which were answered to satisfaction.   Medication Adjustments/Labs and Tests Ordered: Current medicines are reviewed at length with the patient today.  Concerns regarding medicines are outlined above.  No orders of the defined types were placed in this encounter.  No orders of the defined types were placed in this encounter.    No chief complaint on file.    History of Present Illness:    [name] is a 72 y.o. male.  The patient was evaluated by me for chest tightness.  Subsequently he underwent stress testing which was abnormal.  The patient was referred for coronary angiography and fortunately it has come up to be normal.  Patient is very happy about it and is here for follow-up to discuss the results accompanied by his wife.  Past Medical History:  Diagnosis Date  . Anxiety state, unspecified   . Arthritis    osteo arthritis  . Chronic airway obstruction, not elsewhere classified   . Depressive disorder, not elsewhere classified   . GERD (gastroesophageal reflux disease)   . Hoarseness of voice   . Hypertension   . Hypothyroidism   . Osteoporosis   . Reflux gastritis   . S/P radiation therapy 06/14/2015  through 07/30/2015    Glottic larynx 6600 cGy in 33 sessions   . 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 Tobacco chew use     Past Surgical History:  Procedure Laterality Date  . ABDOMINAL SURGERY  1971   terminal ileum removed, ileitis   . Biospy of the Right True Vocal Cord Right 04/23/2015  . CHOLECYSTECTOMY N/A 10/31/2016   Procedure: OPEN CHOLECYSTECTOMY;  Surgeon: Rolm Bookbinder, MD;  Location: [facility];  Service: General;  Laterality: N/A;  . LEFT HEART CATH AND CORONARY ANGIOGRAPHY N/A 03/31/2018   Procedure: LEFT HEART CATH AND CORONARY ANGIOGRAPHY;  Surgeon: Troy Sine, MD;  Location: [facility];  Service: Cardiovascular;  Laterality: N/A;  . MICROLARYNGOSCOPY Right 04/23/2015   Procedure: MICROLARYNGOSCOPY WITH BIOPSY RIGHT TRUE VOCAL CORD;  Surgeon: Izora Gala, MD;  Location: [facility];  Service: ENT;  Laterality: Right;  . SHOULDER SURGERY    . TONSILLECTOMY     as child    Current Medications: Current Meds  Medication Sig  . amLODipine (NORVASC) 10 MG tablet Take 10 mg by mouth daily.  [name] Kitchen aspirin EC 81 MG tablet Take 1 tablet (81 mg total) by mouth daily.  . DULERA 100-5 MCG/ACT AERO USE 2 INHALATIONS FIRST THING IN THE MORNING AND THEN ANOTHER 2 INHALATIONS ABOUT 12 HOURS LATER  . DULoxetine (CYMBALTA) 30 MG capsule Take 90 mg by  mouth daily.   [name] Kitchen levothyroxine (SYNTHROID, LEVOTHROID) 200 MCG tablet Take 200 mcg by mouth daily before breakfast.   . nitroGLYCERIN (NITROSTAT) 0.4 MG SL tablet Place 1 tablet (0.4 mg total) under the tongue every 5 (five) minutes as needed. (Patient taking differently: Place 0.4 mg under the tongue every 5 (five) minutes as needed for chest pain. )  . oxyCODONE-acetaminophen (PERCOCET) 10-325 MG tablet Take 2 tablets by mouth 4 (four) times daily.  . pantoprazole (PROTONIX) 20 MG tablet Take 20 mg by mouth daily.  .  ranitidine (ZANTAC) 150 MG tablet Take 150 mg by mouth at bedtime.  [name] Kitchen testosterone cypionate (DEPOTESTOTERONE CYPIONATE) 200 MG/ML injection Inject 100 mg into the muscle once a week.   . Zoledronic Acid (ZOMETA IV) Inject into the vein every 3 (three) months.     Allergies:   Doxycycline; Guaifenesin; Morphine sulfate; and Tapentadol   Social History   Socioeconomic History  . Marital status: Married    Spouse name: Not on file  . Number of children: Not on file  . Years of education: Not on file  . Highest education level: Not on file  Occupational History  . Not on file  Social Needs  . Financial resource strain: Not on file  . Food insecurity:    Worry: Not on file    Inability: Not on file  . Transportation needs:    Medical: Not on file    Non-medical: Not on file  Tobacco Use  . Smoking status: Former Smoker    Packs/day: 2.00    Years: 43.00    Pack years: 86.00    Types: Cigarettes    Last attempt to quit: 12/01/2001    Years since quitting: 16.4  . Smokeless tobacco: Never Used  . Tobacco comment: 2ppd x 43 years.  Still uses nicotine gum. Quit smoking 13 years ago  Substance and Sexual Activity  . Alcohol use: No    Alcohol/week: 0.0 oz  . Drug use: No  . Sexual activity: Not on file  Lifestyle  . Physical activity:    Days per week: Not on file    Minutes per session: Not on file  . Stress: Not on file  Relationships  . Social connections:    Talks on phone: Not on file    Gets together: Not on file    Attends religious service: Not on file    Active member of club or organization: Not on file    Attends meetings of clubs or organizations: Not on file    Relationship status: Not on file  Other Topics Concern  . Not on file  Social History Narrative   Originally from [place]. Moved to [place] with job relocation in 1996. Married. Previously also lived in [place]. No international travel. 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  Currently has a dog. No bird exposure. No mold exposure. No hot tub exposure. Does wood working with domestic woods.      Family History: The patient's family history includes Allergies in his sister; Lung cancer in his father; Prostate cancer in his father.  ROS:   Please see the history of present illness.    All other systems reviewed and are negative.  EKGs/Labs/Other Studies Reviewed:    The following studies were reviewed today: Coronary angiography report reveals anatomically normal-appearing coronary arteries with preserved systolic function.   Recent Labs: 03/29/2018: BUN 12; Creatinine, Ser 1.08; Hemoglobin 13.2; Platelets 201; Potassium 4.6; Sodium  142  Recent Lipid Panel No results found for: CHOL, TRIG, HDL, CHOLHDL, VLDL, LDLCALC, LDLDIRECT  Physical Exam:    VS:  BP 124/68 (BP Location: Right Arm, Patient Position: Sitting, Cuff Size: Normal)   Pulse 65   Ht 6\' 3"  (1.905 m)   Wt 216 lb 6.4 oz (98.2 kg)   SpO2 94%   BMI 27.05 kg/m     Wt Readings from Last 3 Encounters:  04/28/18 216 lb 6.4 oz (98.2 kg)  03/31/18 220 lb (99.8 kg)  03/29/18 221 lb (100.2 kg)     GEN: Patient is in no acute distress HEENT: Normal NECK: No JVD; No carotid bruits LYMPHATICS: No lymphadenopathy CARDIAC: Hear sounds regular, 2/6 systolic murmur at the apex. RESPIRATORY:  Clear to auscultation without rales, wheezing or rhonchi  ABDOMEN: Soft, non-tender, non-distended MUSCULOSKELETAL:  No edema; No deformity  SKIN: Warm and dry NEUROLOGIC:  Alert and oriented x 3 PSYCHIATRIC:  Normal affect   Signed, Jenean Lindau, MD  04/28/2018 11:25 AM    [place]

## 2018-04-30 DIAGNOSIS — E039 Hypothyroidism, unspecified: Secondary | ICD-10-CM | POA: Diagnosis not present

## 2018-05-11 ENCOUNTER — Ambulatory Visit: Payer: BLUE CROSS/BLUE SHIELD | Admitting: Cardiology

## 2018-05-11 ENCOUNTER — Encounter: Payer: Self-pay | Admitting: Pulmonary Disease

## 2018-05-11 ENCOUNTER — Ambulatory Visit (INDEPENDENT_AMBULATORY_CARE_PROVIDER_SITE_OTHER): Payer: Medicare Other | Admitting: Pulmonary Disease

## 2018-05-11 ENCOUNTER — Ambulatory Visit (INDEPENDENT_AMBULATORY_CARE_PROVIDER_SITE_OTHER)
Admission: RE | Admit: 2018-05-11 | Discharge: 2018-05-11 | Disposition: A | Discharge status: Home / Self Care | Payer: Medicare Other | Source: Ambulatory Visit | Attending: Pulmonary Disease | Admitting: Pulmonary Disease

## 2018-05-11 VITALS — BP 128/70 | HR 77 | Temp 98.4°F | Ht 75.0 in | Wt 217.0 lb

## 2018-05-11 DIAGNOSIS — J440 Chronic obstructive pulmonary disease with acute lower respiratory infection: Secondary | ICD-10-CM

## 2018-05-11 DIAGNOSIS — F17200 Nicotine dependence, unspecified, uncomplicated: Secondary | ICD-10-CM

## 2018-05-11 DIAGNOSIS — K219 Gastro-esophageal reflux disease without esophagitis: Secondary | ICD-10-CM

## 2018-05-11 DIAGNOSIS — R05 Cough: Secondary | ICD-10-CM | POA: Diagnosis not present

## 2018-05-11 DIAGNOSIS — R0602 Shortness of breath: Secondary | ICD-10-CM | POA: Diagnosis not present

## 2018-05-11 MED ORDER — AZITHROMYCIN 250 MG PO TABS: ORAL_TABLET | ORAL | 0 refills | Status: DC

## 2018-05-11 NOTE — Assessment & Plan Note (Signed)
Continue current regimen Make sure to maintain an appropriate diet with regards to your acid reflux Avoid large meals especially 2 hours before bed

## 2018-05-11 NOTE — Patient Instructions (Signed)
X-ray today Z-Pak today >>> Take 2 tablets (500 mg total) today, take 1 tablet (250 mg) each day after for a total of 4 days. >>> Take with food >>> You can start probiotic or eat yogurt during this time to help restore good Bacteria  You can use Delsym over-the-counter cough medicine to help with cough.  Follow-up with our office if you are not feeling better after these interventions, not able to tolerate liquids or eat foods regularly.   Keep scheduled follow-up appointment in September/2019 with Dr. Melvyn Novas.  We will also complete pulmonary function test at this time.      Please contact the office if your symptoms worsen or you have concerns that you are not improving.   Thank you for choosing  [facility] for your healthcare, and for allowing [place] to partner with you on your healthcare journey. I am thankful to be able to provide care to you today.   Wyn Quaker FNP-C

## 2018-05-11 NOTE — Progress Notes (Addendum)
@Patient  ID: [name], male    DOB: 1946-06-20, 72 y.o.   MRN: [id number]  Chief Complaint  Patient presents with  . Acute Visit    sore throat, cough, head feels stuffy, chest tightness started yesterday. Using dulera daily. T 99.5 this AM    Referring provider: Carol Ada, MD  HPI: 72 year old patient with COPD, past medical history of multiple lung nodules and GERD.  Former patient of Dr. Ashok Cordia.  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 Prescribed Dulera. No new breathing problems since last appointment. No exacerbations since last appointment. He reports rare to never need for his rescue inhaler. No coughing or wheezing.   Multiple lung nodules: Some nodules with partial calcification. Largest nodule measuring 1.3 cm left lower lobe without significant change in size compared with 2006. Patient no longer qualifies for lung cancer screening. No suspicious or enlarging lesions otherwise.  GERD: Previously prescribed Zantac. Protonix added to patient's regimen since last appointment. No reflux or dyspepsia. No morning brash water taste.     Recent [facility] Encounters:   08/19/2017-[facility] Reasonably well-controlled COPD at this time.  CT scan in April/2018 shows no change in his bilateral subcentimeter nodules as well as his left lower lobe nodule. Plan: Screening for alpha-1 antitrypsin deficiency today, continuing Dulera, repeat pulmonary function test at next appointment, continue Protonix and Zantac, up-to-date on pneumonia vaccines Follow-up in 1 year  Tests:   PFT 02/12/17: FVC 4.53 L (93%) FEV1 3.22 L (90%) FEV1/FVC 0.71 FEF 25-75 2.12 L (81%) negative bronchodilator response TLC 8.10 L (105%) RV 134% ERV 124% DLCO corrected 70% 09/08/14: FVC 4.85 L (98%) FEV1 3.46 L (94%) FEV1/FVC 0.71 FEF 25-75 2.30 L (83%) negative bronchodilator response  IMAGING LD CT CHEST W/O 03/16/17 (personally reviewed by me):  Patient has  apical predominant mild centrilobular & paraseptal emphysema. Mild diffuse bronchial wall thickening. Scattered subcentimeter calcified and noncalcified pulmonary nodules. Largest nodule without calcification measuring 1.3 cm in left lower lobe without significant change in size compared with previous CT imaging going back to 2006. Questionable areas of mild bronchiectasis. No pleural effusion or thickening. No pericardial effusion. No pathologic mediastinal adenopathy.  LD CT CHEST W/O 03/13/16 (previously reviewed by me): Multiple pulmonary nodules bilaterally unchanged in size. Largest nodule 1.3 cm posterior aspect left lower lobe. Nodule reportedly stable going back to 03/28/05. Calcified nodules noted. Mild diffuse bronchial wall thickening & mild centrilobular/paraseptal emphysema. No pleural effusion. Calcified granulomas noted in the liver and spleen. No pleural thickening appreciated either. No pathologic mediastinal adenopathy. Recommended repeat CT imaging in 12 months. LLL lung nodule is indeed stable going back to 2006 on my review.   LD CHEST CT W/O 08/22/15 (per radiologist): Multiple pulmonary nodules bilaterally with largest in the posterior left lower lobe measuring 1.3 cm & stable compared to recent prior exam from 08/15/14. Nodule reportedly present and virtually unchanged compared with studies going back to 2007. Largest noncalcified pulmonary nodule in the medial aspect left lower lobe measuring 6 mm. Calcified nodules noted. Cylindrical) varicose bronchiectasis with thickening of. Bronchovascular interstitium and extensive peri-bronchovascular micro-nodularities with architectural distortion in the inferior segment of the lingula. Mild diffuse bronchial wall thickening and mild centrilobular and paraseptal emphysema. No pleural effusion. Recommended for short-term follow-up with imaging in 6 months.  LABS 10/22/10 IgE:  <1.5     05/11/18  Acute  72 year old patient presenting  today with 2 days of sore throat, cough, body aches, fever (  99.5 T-max).  Has not used any over-the-counter measures to manage symptoms.  Patient and wife both very concerned about a potential pneumonia as patient has had pneumonias in the past that required extensive interventions and hospitalizations for.  Patient scheduled for September follow-up appointment with Dr. Melvyn Novas.  We will also get pulmonary function done at this appointment.  As per Dr. Ammie Dalton last note.  Allergies  Allergen Reactions  . Doxycycline Nausea And Vomiting  . Guaifenesin Nausea And Vomiting  . Morphine Sulfate Other (See Comments)    hiccups  . Tapentadol Other (See Comments)    Ineffective     Immunization History  Administered Date(s) Administered  . H1N1 12/26/2008  . Influenza Split 08/27/2012, 08/11/2016  . Influenza Whole 09/03/2006, 08/17/2008, 10/01/2009, 08/30/2010, 08/08/2011  . Influenza, High Dose Seasonal PF 08/11/2016, 08/19/2017  . Influenza,inj,Quad PF,6+ Mos 08/17/2013, 08/09/2014  . Pneumococcal Conjugate-13 08/09/2014  . Pneumococcal Polysaccharide-23 09/03/2005, 09/12/2010    Past Medical History:  Diagnosis Date  . Anxiety state, unspecified   . Arthritis    osteo arthritis  . Chronic airway obstruction, not elsewhere classified   . Depressive disorder, not elsewhere classified   . GERD (gastroesophageal reflux disease)   . Hoarseness of voice   . Hypertension   . Hypothyroidism   . Osteoporosis   . Reflux gastritis   . S/P radiation therapy 06/14/2015 through 07/30/2015    Glottic larynx 6600 cGy in 33 sessions   . 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 Tobacco chew use     Tobacco History: Social History   Tobacco Use  Smoking Status Former Smoker  . Packs/day: 2.00  . Years: 43.00  . Pack years: 86.00  . Types: Cigarettes  . Last attempt to quit: 12/01/2001  . Years  since quitting: 16.4  Smokeless Tobacco Never Used  Tobacco Comment   2ppd x 43 years.  Still uses nicotine gum. Quit smoking 13 years ago   Counseling given: Not Answered Comment: 2ppd x 43 years.  Still uses nicotine gum. Quit smoking 13 years ago Continue not smoking.  Has completed low-dose CT screening program.  Outpatient Encounter Medications as of 05/11/2018  Medication Sig  . amLODipine (NORVASC) 10 MG tablet Take 10 mg by mouth daily.  . DULERA 100-5 MCG/ACT AERO USE 2 INHALATIONS FIRST THING IN THE MORNING AND THEN ANOTHER 2 INHALATIONS ABOUT 12 HOURS LATER  . DULoxetine (CYMBALTA) 30 MG capsule Take 90 mg by mouth daily.   [name] Kitchen levothyroxine (SYNTHROID, LEVOTHROID) 200 MCG tablet Take 200 mcg by mouth daily before breakfast.   . oxyCODONE-acetaminophen (PERCOCET) 10-325 MG tablet Take 2 tablets by mouth 4 (four) times daily.  . pantoprazole (PROTONIX) 20 MG tablet Take 20 mg by mouth daily.  . ranitidine (ZANTAC) 150 MG tablet Take 150 mg by mouth at bedtime.  [name] Kitchen testosterone cypionate (DEPOTESTOTERONE CYPIONATE) 200 MG/ML injection Inject 100 mg into the muscle once a week.   . Zoledronic Acid (ZOMETA IV) Inject into the vein every 3 (three) months.  [name] Kitchen azithromycin (ZITHROMAX) 250 MG tablet Take 2 tablets for a total of 500 mg today. Take 1 tablet for a total of 250 mg for the following 4 days  . [DISCONTINUED] aspirin EC 81 MG tablet Take 1 tablet (81 mg total) by mouth daily.  . [DISCONTINUED] nitroGLYCERIN (NITROSTAT) 0.4 MG SL tablet Place 1 tablet (0.4 mg total) under the tongue every 5 (five) minutes as needed. (Patient taking differently: Place 0.4 mg under  the tongue every 5 (five) minutes as needed for chest pain. )   No facility-administered encounter medications on file as of 05/11/2018.      Review of Systems  Constitutional:  +fever - tmax 99.5,  No  weight loss, night sweats,  fevers, chills HEENT:  +nasal congestion, nasal stuffiness, ST No headaches,  Difficulty  swallowing,  Tooth/dental problems, No sneezing, itching, ear ache CV: No chest pain,  orthopnea, PND, swelling in lower extremities, anasarca, dizziness, palpitations, syncope  GI: No heartburn, indigestion, abdominal pain, nausea, vomiting, diarrhea, change in bowel habits, loss of appetite, bloody stools Resp: +dry cough      No shortness of breath with exertion or at rest.  No excess mucus, no productive cough,  No non-productive cough,  No coughing up of blood.  No change in color of mucus.  No wheezing.  No chest wall deformity Skin: no rash, lesions, no skin changes. GU: no dysuria, change in color of urine, no urgency or frequency.  No flank pain, no hematuria  MS:  No joint pain or swelling.  No decreased range of motion.  No back pain. Psych:  No change in mood or affect. No depression or anxiety.  No memory loss.   Physical Exam  BP 128/70   Pulse 77   Temp 98.4 F (36.9 C) (Oral)   Ht 6\' 3"  (1.905 m)   Wt 217 lb (98.4 kg)   SpO2 95%   BMI 27.12 kg/m    Wt Readings from Last 3 Encounters:  05/11/18 217 lb (98.4 kg)  04/28/18 216 lb 6.4 oz (98.2 kg)  03/31/18 220 lb (99.8 kg)     GEN: A/Ox3; pleasant , NAD, well nourished    HEENT:  [place]/AT,  EACs-clear with cerumen, TMs-wnl, NOSE-clear, THROAT- +post nasal drip   NECK:  Supple w/ fair ROM; no JVD; normal carotid impulses w/o bruits; no thyromegaly or nodules palpated; no lymphadenopathy.    RESP:  +scattered squeaks in upper lobe, few rales in lower bases, diminished in bases.  no accessory muscle use, no dullness to percussion  CARD:  RRR, no m/r/g, no peripheral edema, pulses intact, no cyanosis or clubbing.  GI:   Soft & nt; nml bowel sounds; no organomegaly or masses detected.   Musco: Warm bil, no deformities or joint swelling noted.   Neuro: alert, no focal deficits noted.    Skin: Warm, no lesions or rashes    Lab Results:  CBC    Component Value Date/Time   WBC 6.5 03/29/2018 1205   WBC 6.6  01/22/2017 1253   RBC 4.59 03/29/2018 1205   RBC 3.92 (L) 01/22/2017 1253   HGB 13.2 03/29/2018 1205   HCT 39.9 03/29/2018 1205   PLT 201 03/29/2018 1205   MCV 87 03/29/2018 1205   MCH 28.8 03/29/2018 1205   MCH 28.1 01/22/2017 1253   MCHC 33.1 03/29/2018 1205   MCHC 32.0 01/22/2017 1253   RDW 15.8 (H) 03/29/2018 1205   LYMPHSABS 1.5 03/29/2018 1205   MONOABS 1.3 (H) 11/02/2016 0349   EOSABS 0.9 (H) 03/29/2018 1205   BASOSABS 0.0 03/29/2018 1205    BMET    Component Value Date/Time   NA 142 03/29/2018 1205   K 4.6 03/29/2018 1205   CL 103 03/29/2018 1205   CO2 24 03/29/2018 1205   GLUCOSE 88 03/29/2018 1205   GLUCOSE 112 (H) 01/22/2017 1253   BUN 12 03/29/2018 1205   CREATININE 1.08 03/29/2018 1205   CALCIUM 10.1 03/29/2018  [place] 03/29/2018 1205   GFRAA 78 03/29/2018 1205    BNP No results found for: BNP  ProBNP No results found for: PROBNP  Imaging: No results found.   Assessment & Plan:   72 year old patient seen in office today.  Bronchitis flare.   Chest x-ray today.  Z-Pak today.  Can use Delsym for cough treatment.  Bronchitis, chronic obstructive w acute bronchitis (HCC) X-ray today Z-Pak today >>> Take 2 tablets (500 mg total) today, take 1 tablet (250 mg) each day after for a total of 4 days. >>> Take with food >>> You can start probiotic or eat yogurt during this time to help restore good Bacteria  You can use Delsym over-the-counter cough medicine to help with cough.   Tobacco use disorder Not smoking Still using nicotine gum  GERD (gastroesophageal reflux disease) Continue current regimen Make sure to maintain an appropriate diet with regards to your acid reflux Avoid large meals especially 2 hours before bed       Lauraine Rinne, NP 05/11/2018       Addendum 05/12/18  Per chest x-ray results from 05/11/2018 radiology recommending repeat chest x-ray with nipple markers.  Showing a potential nodular density in the  left base.  Will repeat chest x-ray per those recommendations.  Patient has been contacted and agrees to perform this test.  Wyn Quaker FNP

## 2018-05-11 NOTE — Assessment & Plan Note (Signed)
X-ray today Z-Pak today >>> Take 2 tablets (500 mg total) today, take 1 tablet (250 mg) each day after for a total of 4 days. >>> Take with food >>> You can start probiotic or eat yogurt during this time to help restore good Bacteria  You can use Delsym over-the-counter cough medicine to help with cough.

## 2018-05-11 NOTE — Assessment & Plan Note (Signed)
Not smoking Still using nicotine gum

## 2018-05-12 NOTE — Progress Notes (Signed)
Chest x-ray results of come back.  Showing a potential nodular density.  We will repeat the chest x-ray with specific nipple markers to ensure that we get a good picture.  Ideally if we can get this completed within the next week or 2 that would be ideal.   Based off of these results we can decide if we need to proceed forward with potential CT.  This does not need to be an office visit.  I will place the order for chest x-ray to be performed.  Wyn Quaker FNP

## 2018-05-12 NOTE — Progress Notes (Signed)
Chart and office note reviewed in detail  > agree with a/p as outlined    

## 2018-05-12 NOTE — Addendum Note (Signed)
Addended by: Lauraine Rinne on: 05/12/2018 12:43 PM   Modules accepted: Orders

## 2018-05-13 ENCOUNTER — Ambulatory Visit (INDEPENDENT_AMBULATORY_CARE_PROVIDER_SITE_OTHER)
Admission: RE | Admit: 2018-05-13 | Discharge: 2018-05-13 | Disposition: A | Discharge status: Home / Self Care | Payer: Medicare Other | Source: Ambulatory Visit | Attending: Pulmonary Disease | Admitting: Pulmonary Disease

## 2018-05-13 DIAGNOSIS — J440 Chronic obstructive pulmonary disease with acute lower respiratory infection: Secondary | ICD-10-CM | POA: Diagnosis not present

## 2018-05-13 DIAGNOSIS — R911 Solitary pulmonary nodule: Secondary | ICD-10-CM | POA: Diagnosis not present

## 2018-05-13 NOTE — Progress Notes (Signed)
Repeat chest x-ray based off of radiology's recommendation 05/11/2018's x-ray has come back.  Showing no acute changes with nipple markers present.  No further changes in plan of care at this time.  Thank you for your flexibility and following up based on this radiology recommendation.  Wyn Quaker FNP

## 2018-05-14 ENCOUNTER — Telehealth: Payer: Self-pay | Admitting: Pulmonary Disease

## 2018-05-14 ENCOUNTER — Encounter: Payer: Self-pay | Admitting: Pulmonary Disease

## 2018-05-14 ENCOUNTER — Ambulatory Visit (INDEPENDENT_AMBULATORY_CARE_PROVIDER_SITE_OTHER): Payer: Medicare Other | Admitting: Pulmonary Disease

## 2018-05-14 VITALS — BP 110/74 | HR 85 | Ht 75.0 in | Wt 215.2 lb

## 2018-05-14 DIAGNOSIS — R509 Fever, unspecified: Secondary | ICD-10-CM

## 2018-05-14 DIAGNOSIS — J440 Chronic obstructive pulmonary disease with acute lower respiratory infection: Secondary | ICD-10-CM

## 2018-05-14 MED ORDER — AZITHROMYCIN 250 MG PO TABS: ORAL_TABLET | ORAL | 0 refills | Status: DC

## 2018-05-14 MED ORDER — PREDNISONE 10 MG PO TABS: ORAL_TABLET | ORAL | 0 refills | Status: DC

## 2018-05-14 NOTE — Assessment & Plan Note (Addendum)
Azithromycin >>> We will extend dose by 3 more days (1 250 mg tablet each day) >>> Finished previous prescription first and then start the prescription on providing today >>> Take with food  Prednisone >>>4 tabs for 2 days, then 3 tabs for 2 days, 2 tabs for 2 days, then 1 tab for 2 days, then stop >>>take with food   And use ibuprofen (1-2 200 mg tablets) as needed to address fever.  Be mindful with the Percocet dosing that you have to not go for 3 g of Tylenol/acetaminophen.

## 2018-05-14 NOTE — Telephone Encounter (Signed)
Pharm is [facility] in [place].

## 2018-05-14 NOTE — Telephone Encounter (Signed)
Spoke with pt's wife, Nella. She states that the pt is not improved since his appointment with [name] on 05/11/18. Advised Nella since we are coming up on a weekend, we should see him in the office. Pt has been scheduled with [name] today at 3:00pm. Nothing further was needed.

## 2018-05-14 NOTE — Patient Instructions (Addendum)
Azithromycin >>> We will extend dose by 3 more days (1 250 mg tablet each day) >>> Finished previous prescription first and then start the prescription on providing today >>> Take with food  Prednisone >>>4 tabs for 2 days, then 3 tabs for 2 days, 2 tabs for 2 days, then 1 tab for 2 days, then stop >>>take with food   And use ibuprofen (1-2 200 mg tablets) as needed to address fever.  Be mindful with the Percocet dosing that you have to not go for 3 g of Tylenol/acetaminophen.   Continue follow-up with Dr. Melvyn Novas as scheduled  Please contact the office if your symptoms worsen or you have concerns that you are not improving.   Thank you for choosing [facility] for your healthcare, and for allowing [place] to partner with you on your healthcare journey. I am thankful to be able to provide care to you today.   Wyn Quaker FNP-C

## 2018-05-14 NOTE — Progress Notes (Signed)
@Patient  ID: [name], male    DOB: 1946-09-04, 72 y.o.   MRN: [id number]  Chief Complaint  Patient presents with  . Follow-up    One dose of Z pack left, congestion has improved but chest remains tight and reports temp 99.3    Referring provider: Carol Ada, MD  HPI:  72 year old patient with COPD, past medical history of multiple lung nodules and GERD.  Former patient of Dr. Ashok Cordia.  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 Prescribed Dulera. No new breathing problems since last appointment. No exacerbations since last appointment. He reports rare to never need for his rescue inhaler. No coughing or wheezing.   Multiple lung nodules: Some nodules with partial calcification. Largest nodule measuring 1.3 cm left lower lobe without significant change in size compared with 2006. Patient no longer qualifies for lung cancer screening. No suspicious or enlarging lesions otherwise.  GERD: Previously prescribed Zantac. Protonix added to patient's regimen since last appointment. No reflux or dyspepsia. No morning brash water taste.     Recent [place] [facility] Encounters:   08/19/2017-[facility] Reasonably well-controlled COPD at this time.  CT scan in April/2018 shows no change in his bilateral subcentimeter nodules as well as his left lower lobe nodule. Plan: Screening for alpha-1 antitrypsin deficiency today, continuing Dulera, repeat pulmonary function test at next appointment, continue Protonix and Zantac, up-to-date on pneumonia vaccines Follow-up in 1 year  05/11/18  Acute  72 year old patient presenting today with 2 days of sore throat, cough, body aches, fever (99.5 T-max).  Has not used any over-the-counter measures to manage symptoms.  Patient and wife both very concerned about a potential pneumonia as patient has had pneumonias in the past that required extensive interventions and hospitalizations for. Patient scheduled for September  follow-up appointment with Dr. Melvyn Novas.  We will also get pulmonary function done at this appointment.  As per Dr. Ammie Dalton last note.   Tests:   PFT 02/12/17: FVC 4.53 L (93%) FEV1 3.22 L (90%) FEV1/FVC 0.71 FEF 25-75 2.12 L (81%) negative bronchodilator response TLC 8.10 L (105%) RV 134% ERV 124% DLCO corrected 70% 09/08/14:FVC 4.85 L (98%) FEV1 3.46 L (94%) FEV1/FVC 0.71 FEF 25-75 2.30 L (83%) negative bronchodilator response  IMAGING LD CT CHEST W/O 03/16/17 (personally reviewed by me):  Patient has apical predominant mild centrilobular & paraseptal emphysema. Mild diffuse bronchial wall thickening. Scattered subcentimeter calcified and noncalcified pulmonary nodules. Largest nodule without calcification measuring 1.3 cm in left lower lobe without significant change in size compared with previous CT imaging going back to 2006. Questionable areas of mild bronchiectasis. No pleural effusion or thickening. No pericardial effusion. No pathologic mediastinal adenopathy.  LD CT CHEST W/O 03/13/16 (previously reviewed by me):Multiple pulmonary nodules bilaterally unchanged in size. Largest nodule 1.3 cm posterior aspect left lower lobe. Nodule reportedly stable going back to 03/28/05. Calcified nodules noted. Mild diffuse bronchial wall thickening &mild centrilobular/paraseptal emphysema. No pleural effusion. Calcified granulomas noted in the liver and spleen. No pleural thickening appreciated either. No pathologic mediastinal adenopathy. Recommended repeat CT imaging in 12 months. LLL lung nodule is indeed stable going back to 2006 on my review.   LD CHEST CT W/O 08/22/15 (per radiologist):Multiple pulmonary nodules bilaterally with largest in the posterior left lower lobe measuring 1.3 cm &stable compared to recent prior exam from 08/15/14. Nodule reportedly present and virtually unchanged compared with studies going back to 2007. Largest noncalcified pulmonary nodule in the medial aspect left lower  lobe measuring 6 mm. Calcified nodules noted. Cylindrical) varicose bronchiectasis with thickening of. Bronchovascular interstitium and extensive peri-bronchovascular micro-nodularities with architectural distortion in the inferior segment of the lingula. Mild diffuse bronchial wall thickening and mild centrilobular and paraseptal emphysema. No pleural effusion. Recommended for short-term follow-up with imaging in 6 months.  LABS 10/22/10 IgE: <1.5     05/14/18 Acute   72 year old patient presenting for acute visit today.  Patient has 1 more day left of Z-Pak which he was prescribed earlier this week at this office.  Patient reporting that congestion, and other symptoms have resolved but still having some chest tightness.  Patient does feel that he is wheezing more but not a lot.  Patient adherent to [facility].  Patient does have an occasional fever.  Patient has been taking Percocet chronically for pain with that Tylenol fevers occasionally 99.  Patient is afebrile in office today.  Of note patient has had 2 x-rays this week of his chest showing no infiltrates.   Allergies  Allergen Reactions  . Doxycycline Nausea And Vomiting  . Guaifenesin Nausea And Vomiting  . Morphine Sulfate Other (See Comments)    hiccups  . Tapentadol Other (See Comments)    Ineffective     Immunization History  Administered Date(s) Administered  . H1N1 12/26/2008  . Influenza Split 08/27/2012, 08/11/2016  . Influenza Whole 09/03/2006, 08/17/2008, 10/01/2009, 08/30/2010, 08/08/2011  . Influenza, High Dose Seasonal PF 08/11/2016, 08/19/2017  . Influenza,inj,Quad PF,6+ Mos 08/17/2013, 08/09/2014  . Pneumococcal Conjugate-13 08/09/2014  . Pneumococcal Polysaccharide-23 09/03/2005, 09/12/2010    Past Medical History:  Diagnosis Date  . Anxiety state, unspecified   . Arthritis    osteo arthritis  . Chronic airway obstruction, not elsewhere classified   . Depressive disorder, not elsewhere classified    . GERD (gastroesophageal reflux disease)   . Hoarseness of voice   . Hypertension   . Hypothyroidism   . Osteoporosis   . Reflux gastritis   . S/P radiation therapy 06/14/2015 through 07/30/2015    Glottic larynx 6600 cGy in 33 sessions   . 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 Tobacco chew use     Tobacco History: Social History   Tobacco Use  Smoking Status Former Smoker  . Packs/day: 2.00  . Years: 43.00  . Pack years: 86.00  . Types: Cigarettes  . Last attempt to quit: 12/01/2001  . Years since quitting: 16.4  Smokeless Tobacco Never Used  Tobacco Comment   2ppd x 43 years.  Still uses nicotine gum. Quit smoking 13 years ago   Counseling given: Not Answered Comment: 2ppd x 43 years.  Still uses nicotine gum. Quit smoking 13 years ago Continue not smoking.  Outpatient Encounter Medications as of 05/14/2018  Medication Sig  . amLODipine (NORVASC) 10 MG tablet Take 10 mg by mouth daily.  . DULERA 100-5 MCG/ACT AERO USE 2 INHALATIONS FIRST THING IN THE MORNING AND THEN ANOTHER 2 INHALATIONS ABOUT 12 HOURS LATER  . DULoxetine (CYMBALTA) 30 MG capsule Take 90 mg by mouth daily.   [name] Kitchen levothyroxine (SYNTHROID, LEVOTHROID) 200 MCG tablet Take 200 mcg by mouth daily before breakfast.   . oxyCODONE-acetaminophen (PERCOCET) 10-325 MG tablet Take 2 tablets by mouth 4 (four) times daily.  . pantoprazole (PROTONIX) 20 MG tablet Take 20 mg by mouth daily.  . ranitidine (ZANTAC) 150 MG tablet Take 150 mg by mouth at bedtime.  [name] Kitchen testosterone cypionate (DEPOTESTOTERONE CYPIONATE) 200 MG/ML injection Inject 100 mg into the  muscle once a week.   . Zoledronic Acid (ZOMETA IV) Inject into the vein every 3 (three) months.  [name] Kitchen azithromycin (ZITHROMAX) 250 MG tablet Finish initial Zpak, then continue with  250mg  (1 tablet) for the following 3 days  . predniSONE (DELTASONE) 10 MG tablet 4 tabs for 2  days, then 3 tabs for 2 days, 2 tabs for 2 days, then 1 tab for 2 days, then stop  . [DISCONTINUED] azithromycin (ZITHROMAX) 250 MG tablet Take 2 tablets for a total of 500 mg today. Take 1 tablet for a total of 250 mg for the following 4 days (Patient not taking: Reported on 05/14/2018)  . [DISCONTINUED] azithromycin (ZITHROMAX) 250 MG tablet 500mg  (two tablets) today, then 250mg  (1 tablet) for the next 4 days   No facility-administered encounter medications on file as of 05/14/2018.      Review of Systems  Constitutional: +fever - occasional, chills, fatigue   No  weight loss HEENT:   No headaches,  Difficulty swallowing,  Tooth/dental problems, or  Sore throat, No sneezing, itching, ear ache, nasal congestion, post nasal drip  CV: No chest pain,  orthopnea, PND, swelling in lower extremities, anasarca, dizziness, palpitations, syncope  GI: No heartburn, indigestion, abdominal pain, nausea, vomiting, diarrhea, change in bowel habits, loss of appetite, bloody stools Resp: +dry cough - slightly improved, slight wheeze  No shortness of breath with exertion or at rest.  No excess mucus, no productive cough,  No non-productive cough,  No coughing up of blood.  No change in color of mucus.  No wheezing.  No chest wall deformity Skin: no rash, lesions, no skin changes. GU: no dysuria, change in color of urine, no urgency or frequency.  No flank pain, no hematuria  MS:  No joint pain or swelling.  No decreased range of motion.  No back pain. Psych:  No change in mood or affect. No depression or anxiety.  No memory loss.   Physical Exam  BP 110/74   Pulse 85   Ht 6\' 3"  (1.905 m)   Wt 215 lb 3.2 oz (97.6 kg)   SpO2 96%   BMI 26.90 kg/m   Wt Readings from Last 3 Encounters:  05/14/18 215 lb 3.2 oz (97.6 kg)  05/11/18 217 lb (98.4 kg)  04/28/18 216 lb 6.4 oz (98.2 kg)     GEN: A/Ox3; pleasant , NAD, well nourished    HEENT:  /AT,  EACs-clear, TMs-wnl, NOSE-clear, THROAT-clear   NECK:   Supple w/ fair ROM; no JVD; normal carotid impulses w/o bruits; no thyromegaly or nodules palpated; no lymphadenopathy.    RESP:  +scattered squeaks with slight wheeze in upper lobes, diminished in bases, no rales heard on exam today  no accessory muscle use, no dullness to percussion  CARD:  RRR, no m/r/g, no peripheral edema, pulses intact, no cyanosis or clubbing.  GI:   Soft & nt; nml bowel sounds; no organomegaly or masses detected.   Musco: Warm bil, no deformities or joint swelling noted.   Neuro: alert, no focal deficits noted.    Skin: Warm, no lesions or rashes    Lab Results:  CBC    Component Value Date/Time   WBC 6.5 03/29/2018 1205   WBC 6.6 01/22/2017 1253   RBC 4.59 03/29/2018 1205   RBC 3.92 (L) 01/22/2017 1253   HGB 13.2 03/29/2018 1205   HCT 39.9 03/29/2018 1205   PLT 201 03/29/2018 1205   MCV 87 03/29/2018 1205   MCH 28.8 03/29/2018  1205   MCH 28.1 01/22/2017 1253   MCHC 33.1 03/29/2018 1205   MCHC 32.0 01/22/2017 1253   RDW 15.8 (H) 03/29/2018 1205   LYMPHSABS 1.5 03/29/2018 1205   MONOABS 1.3 (H) 11/02/2016 0349   EOSABS 0.9 (H) 03/29/2018 1205   BASOSABS 0.0 03/29/2018 1205    BMET    Component Value Date/Time   NA 142 03/29/2018 1205   K 4.6 03/29/2018 1205   CL 103 03/29/2018 1205   CO2 24 03/29/2018 1205   GLUCOSE 88 03/29/2018 1205   GLUCOSE 112 (H) 01/22/2017 1253   BUN 12 03/29/2018 1205   CREATININE 1.08 03/29/2018 1205   CALCIUM 10.1 03/29/2018 1205   GFRNONAA 68 03/29/2018 1205   GFRAA 78 03/29/2018 1205    BNP No results found for: BNP  ProBNP No results found for: PROBNP  Imaging: Dg Chest 2 View  Result Date: 05/13/2018 CLINICAL DATA:  Chest x-ray with nipple markers evaluate possible left lower lobe nodule. EXAM: CHEST - 2 VIEW COMPARISON:  05/11/2018 and multiple prior chest CTs with most recent 03/16/2017. FINDINGS: Lungs are adequately inflated and demonstrate 1.6 cm nodule over the posterior left lower lobe seen  by CT for C seen 18 as noncalcified nodule which have been stable for many years and therefore considered benign. Calcified granuloma over the anterior right midlung. Remainder lungs are clear. Cardiomediastinal silhouette and remainder of the exam is unchanged. IMPRESSION: No acute cardiopulmonary disease. Noncalcified nodule posterior left lower lobe evaluated by CT 03/16/2017 and found to be stable for many years and therefore benign. Electronically Signed   By: Marin Olp M.D.   On: 05/13/2018 13:51   Dg Chest 2 View  Result Date: 05/11/2018 CLINICAL DATA:  Cough and congestion. Shortness of breath. Low-grade fever. EXAM: CHEST - 2 VIEW COMPARISON:  January 22, 2017 FINDINGS: A nodular density projects over the left lung base. No other suspicious nodules. No pneumothorax. No focal infiltrates. The cardiomediastinal silhouette is unremarkable. A calcified nodule seen on the lateral view anteriorly has been seen previously and is of no significance. This is more superiorly located than expected to correlate with the nodule over the left base on the frontal view. IMPRESSION: 1. No acute abnormality. 2. Nodular density projected over the left base on the frontal view may represent a nipple shadow. Recommend a repeat study with nipple markers when the patient is able. Electronically Signed   By: Dorise Bullion III M.D   On: 05/11/2018 15:42     Assessment & Plan:   Pleasant patient seen in office today.  As discussed with patient earlier this week thought he would benefit from short prednisone taper.  He had declined last appointment.  Will treat with prednisone taper today.  Will extend azithromycin dose by 3 more days.  Patient can continue his Percocet for chronic pain just be mindful of the amount of Tylenol in the dosing.  Avoid any over-the-counter medications have Tylenol.  Patient can use ibuprofen 1-2 (200 mg tablets) as needed for fever.  If patient is having to use this routinely need to  notify our office.  Keep scheduled follow-up to establish with Dr. Melvyn Novas.   Bronchitis, chronic obstructive w acute bronchitis (HCC) Azithromycin >>> We will extend dose by 3 more days (1 250 mg tablet each day) >>> Finished previous prescription first and then start the prescription on providing today >>> Take with food  Prednisone >>>4 tabs for 2 days, then 3 tabs for 2 days, 2 tabs  for 2 days, then 1 tab for 2 days, then stop >>>take with food   And use ibuprofen (1-2 200 mg tablets) as needed to address fever.  Be mindful with the Percocet dosing that you have to not go for 3 g of Tylenol/acetaminophen.   Fever in adult Azithromycin >>> We will extend dose by 3 more days (1 250 mg tablet each day) >>> Finished previous prescription first and then start the prescription on providing today >>> Take with food  Prednisone >>>4 tabs for 2 days, then 3 tabs for 2 days, 2 tabs for 2 days, then 1 tab for 2 days, then stop >>>take with food   And use ibuprofen (1-2 200 mg tablets) as needed to address fever.  Be mindful with the Percocet dosing that you have to not go for 3 g of Tylenol/acetaminophen.      Lauraine Rinne, NP 05/16/2018

## 2018-05-16 ENCOUNTER — Encounter: Payer: Self-pay | Admitting: Pulmonary Disease

## 2018-05-17 DIAGNOSIS — M150 Primary generalized (osteo)arthritis: Secondary | ICD-10-CM | POA: Diagnosis not present

## 2018-05-17 DIAGNOSIS — Z79891 Long term (current) use of opiate analgesic: Secondary | ICD-10-CM | POA: Diagnosis not present

## 2018-05-17 DIAGNOSIS — G894 Chronic pain syndrome: Secondary | ICD-10-CM | POA: Diagnosis not present

## 2018-05-17 NOTE — Progress Notes (Signed)
Chart and office note reviewed in detail  > agree with a/p as outlined    

## 2018-05-20 DIAGNOSIS — K432 Incisional hernia without obstruction or gangrene: Secondary | ICD-10-CM | POA: Diagnosis not present

## 2018-05-20 DIAGNOSIS — E039 Hypothyroidism, unspecified: Secondary | ICD-10-CM | POA: Diagnosis not present

## 2018-05-20 DIAGNOSIS — F3341 Major depressive disorder, recurrent, in partial remission: Secondary | ICD-10-CM | POA: Diagnosis not present

## 2018-05-20 DIAGNOSIS — I10 Essential (primary) hypertension: Secondary | ICD-10-CM | POA: Diagnosis not present

## 2018-05-20 DIAGNOSIS — Z1389 Encounter for screening for other disorder: Secondary | ICD-10-CM | POA: Diagnosis not present

## 2018-05-20 DIAGNOSIS — J449 Chronic obstructive pulmonary disease, unspecified: Secondary | ICD-10-CM | POA: Diagnosis not present

## 2018-05-20 DIAGNOSIS — Z23 Encounter for immunization: Secondary | ICD-10-CM | POA: Diagnosis not present

## 2018-05-20 DIAGNOSIS — Z0001 Encounter for general adult medical examination with abnormal findings: Secondary | ICD-10-CM | POA: Diagnosis not present

## 2018-05-21 ENCOUNTER — Other Ambulatory Visit: Payer: Self-pay | Admitting: Urology

## 2018-05-21 DIAGNOSIS — R972 Elevated prostate specific antigen [PSA]: Secondary | ICD-10-CM | POA: Diagnosis not present

## 2018-05-21 DIAGNOSIS — M810 Age-related osteoporosis without current pathological fracture: Secondary | ICD-10-CM | POA: Diagnosis not present

## 2018-05-21 DIAGNOSIS — M8580 Other specified disorders of bone density and structure, unspecified site: Secondary | ICD-10-CM

## 2018-05-21 DIAGNOSIS — Z79899 Other long term (current) drug therapy: Secondary | ICD-10-CM

## 2018-06-08 ENCOUNTER — Ambulatory Visit: Payer: Self-pay | Admitting: Surgery

## 2018-06-08 DIAGNOSIS — K5909 Other constipation: Secondary | ICD-10-CM | POA: Diagnosis not present

## 2018-06-08 DIAGNOSIS — K5090 Crohn's disease, unspecified, without complications: Secondary | ICD-10-CM | POA: Diagnosis not present

## 2018-06-08 DIAGNOSIS — R1900 Intra-abdominal and pelvic swelling, mass and lump, unspecified site: Secondary | ICD-10-CM | POA: Diagnosis not present

## 2018-06-10 ENCOUNTER — Other Ambulatory Visit: Payer: Self-pay | Admitting: Surgery

## 2018-06-10 DIAGNOSIS — R1011 Right upper quadrant pain: Secondary | ICD-10-CM

## 2018-06-11 ENCOUNTER — Ambulatory Visit
Admission: RE | Admit: 2018-06-11 | Discharge: 2018-06-11 | Disposition: A | Discharge status: Home / Self Care | Payer: Medicare Other | Source: Ambulatory Visit | Attending: Surgery | Admitting: Surgery

## 2018-06-11 DIAGNOSIS — R1011 Right upper quadrant pain: Secondary | ICD-10-CM | POA: Diagnosis not present

## 2018-06-11 MED ORDER — IOPAMIDOL (ISOVUE-300) INJECTION 61%
100.0000 mL | Freq: Once | INTRAVENOUS | Status: AC | PRN
  Administered 2018-06-11: 100 mL via INTRAVENOUS

## 2018-06-14 ENCOUNTER — Other Ambulatory Visit: Payer: Self-pay | Admitting: Surgery

## 2018-07-02 ENCOUNTER — Ambulatory Visit
Admission: RE | Admit: 2018-07-02 | Discharge: 2018-07-02 | Disposition: A | Discharge status: Home / Self Care | Payer: Medicare Other | Source: Ambulatory Visit | Attending: Urology | Admitting: Urology

## 2018-07-02 DIAGNOSIS — M85851 Other specified disorders of bone density and structure, right thigh: Secondary | ICD-10-CM | POA: Diagnosis not present

## 2018-07-02 DIAGNOSIS — Z79899 Other long term (current) drug therapy: Secondary | ICD-10-CM

## 2018-07-02 DIAGNOSIS — M8580 Other specified disorders of bone density and structure, unspecified site: Secondary | ICD-10-CM

## 2018-07-12 DIAGNOSIS — G894 Chronic pain syndrome: Secondary | ICD-10-CM | POA: Diagnosis not present

## 2018-07-12 DIAGNOSIS — M150 Primary generalized (osteo)arthritis: Secondary | ICD-10-CM | POA: Diagnosis not present

## 2018-07-12 DIAGNOSIS — Z79891 Long term (current) use of opiate analgesic: Secondary | ICD-10-CM | POA: Diagnosis not present

## 2018-07-16 DIAGNOSIS — Z8521 Personal history of malignant neoplasm of larynx: Secondary | ICD-10-CM | POA: Diagnosis not present

## 2018-07-16 DIAGNOSIS — Z923 Personal history of irradiation: Secondary | ICD-10-CM | POA: Diagnosis not present

## 2018-07-16 DIAGNOSIS — R490 Dysphonia: Secondary | ICD-10-CM | POA: Diagnosis not present

## 2018-08-19 ENCOUNTER — Ambulatory Visit (INDEPENDENT_AMBULATORY_CARE_PROVIDER_SITE_OTHER): Payer: Medicare Other | Admitting: Internal Medicine

## 2018-08-19 ENCOUNTER — Encounter: Payer: Self-pay | Admitting: Internal Medicine

## 2018-08-19 ENCOUNTER — Ambulatory Visit: Payer: Medicare Other | Admitting: Internal Medicine

## 2018-08-19 VITALS — BP 146/82 | HR 65 | Ht 73.0 in | Wt 213.8 lb

## 2018-08-19 DIAGNOSIS — R918 Other nonspecific abnormal finding of lung field: Secondary | ICD-10-CM

## 2018-08-19 DIAGNOSIS — J449 Chronic obstructive pulmonary disease, unspecified: Secondary | ICD-10-CM

## 2018-08-19 DIAGNOSIS — J440 Chronic obstructive pulmonary disease with acute lower respiratory infection: Secondary | ICD-10-CM | POA: Diagnosis not present

## 2018-08-19 LAB — PULMONARY FUNCTION TEST
DL/VA % pred: 80 %
DL/VA: 3.83 ml/min/mmHg/L
DLCO unc % pred: 74 %
DLCO unc: 27.08 ml/min/mmHg
FEF 25-75 Post: 2.83 L/sec
FEF 25-75 Pre: 2 L/sec
FEF2575-%Change-Post: 41 %
FEF2575-%Pred-Post: 110 %
FEF2575-%Pred-Pre: 78 %
FEV1-%CHANGE-POST: 8 %
FEV1-%PRED-POST: 91 %
FEV1-%Pred-Pre: 84 %
FEV1-PRE: 2.93 L
FEV1-Post: 3.19 L
FEV1FVC-%Change-Post: 7 %
FEV1FVC-%Pred-Pre: 99 %
FEV6-%Change-Post: 0 %
FEV6-%PRED-PRE: 89 %
FEV6-%Pred-Post: 90 %
FEV6-POST: 4.09 L
FEV6-PRE: 4.05 L
FEV6FVC-%PRED-POST: 106 %
FEV6FVC-%Pred-Pre: 106 %
FVC-%Change-Post: 0 %
FVC-%PRED-PRE: 84 %
FVC-%Pred-Post: 85 %
FVC-PRE: 4.05 L
FVC-Post: 4.09 L
POST FEV6/FVC RATIO: 100 %
PRE FEV6/FVC RATIO: 100 %
Post FEV1/FVC ratio: 78 %
Pre FEV1/FVC ratio: 72 %
RV % pred: 134 %
RV: 3.64 L
TLC % PRED: 102 %
TLC: 7.8 L

## 2018-08-19 MED ORDER — MOMETASONE FURO-FORMOTEROL FUM 100-5 MCG/ACT IN AERO: INHALATION_SPRAY | RESPIRATORY_TRACT | 1 refills | Status: DC

## 2018-08-19 NOTE — Patient Instructions (Addendum)
Dulera 100 up to 2 puffs every 12 hours if not doing 100% - if doing great can leave off the pm doses of dulera    Please schedule a follow up visit in 12 months but call sooner if needed

## 2018-08-19 NOTE — Assessment & Plan Note (Addendum)
PFTs 09/08/14 min airflow obst which did not meet criteria for copd - 12/08/2013 > try change to dulera 100 2bid> improved  - 08/19/2018  After extensive coaching inhaler device,  effectiveness =    90%  - PFT's  08/19/2018  FEV1 3.19 (91 % ) ratio 78  p 8 % improvement from saba p nothing prior to study with DLCO  74 % corrects to 80  % for alv volume     Most recent flare all better and since does not meet criteria for copd can rx as mild ab  Based on two studies from NEJM  378; 20 p 1865 (2018) and 380 : p2020-30 (2019) in pts with mild asthma it is reasonable to use low dose symbicort eg 80 2bid "prn" flare in this setting but I emphasized this was only shown with symbicort and takes advantage of the rapid onset of action but is not the same as "rescue therapy" but can be stopped once the acute symptoms have resolved and the need for rescue has been minimized (< 2 x weekly)     Since he's tried off dulera in past and worse off it, ok to try just stopping the pm dose and resuming immediately if not doing as well     I had an extended discussion with the patient reviewing all relevant studies completed to date and  lasting 15 to 20 minutes of a 25 minute visit    See device teaching which extended face to face time for this visit.  Each maintenance medication was reviewed in detail including emphasizing most importantly the difference between maintenance and prns and under what circumstances the prns are to be triggered using an action plan format that is not reflected in the computer generated alphabetically organized AVS which I have not found useful in most complex patients, especially with respiratory illnesses  Please see AVS for specific instructions unique to this visit that I personally wrote and verbalized to the the pt in detail and then reviewed with pt  by my nurse highlighting any  changes in therapy recommended at today's visit to their plan of care.

## 2018-08-19 NOTE — Progress Notes (Signed)
PFT completed today 08/19/18.  

## 2018-08-19 NOTE — Progress Notes (Signed)
Subjective:    Patient ID: [name], male    DOB: 02-12-1946   MRN: [id number]  Brief patient profile:  39 yowm quit smoking 2003 with minimal airflow obst on pfts 09/09/15 c/w GOLD 0 copd previously followed by [name] and repeated pfts 08/19/2018 still GOLD 0     History of Present Illness  07/30/2015 final [name] ov: Chief Complaint  Patient presents with  . 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  Hoarseness with radiation.  Occas chest tightness, but overall feels breathing is doing well. No cough.    Pt dx vocal cord Ca in 04/2015. Pt had more hoarseness.  Now with XRT.  Last Rx coming up.   [name] is ENT.  No LN Stage I .  Small CA seen.  Not much cough  rec You will be referred to the Lung cancer screening program No change in medicaitons Return 4 months with [name]    09/04/2015 acute extended ov/Eren [place] re: aecopd / early pna on dulera 100 2bid  Chief Complaint  Patient presents with  . Acute Visit    Pt c/o increased cough, chest tightness and night sweats for the past wk.     acutely ill 08/29/15 with vomiting then diffuse bilateral cp / hacking cough  With minimal yellow sputum.  Worse days was 08/30/15 but more sob than baseline and still sever cough and sweats. Not using saba at all rec Augmentin 875 mg take one pill twice daily  X 10 days -  Prednisone 10 mg take  4 each am x 2 days,   2 each am x 2 days,  1 each am x 2 days and stop     10/16/2015  f/u ov/Kaelyn Innocent re: COPD GOLD 0/ f/u lung nodule maint on dulera 100 2bid  Chief Complaint  Patient presents with  . Follow-up    Pt states that his cough has resolved. He has not needed albuterol.  No new co's today.   Not limited by breathing from desired activities  rec  When not coughing ok to leave the pm Zantac and after a few weeks if not coughing stop the protonix and take zantac after bfast x 2 weeks  At first onset of cough for any reason > Pantoprazole (protonix) 40 mg   Take   30-60 min before first meal of the day and Zantac 150 mg  one @  bedtime until return to office - this is the best way to tell whether stomach acid is contributing to your problem.      08/19/2018  f/u ov/Jaleeya Mcnelly re:  Copd 0/ ab on dulera s/p aecopd seen by NP / maint on ppi q am /h2 hs and dulera 100 2bid  Chief Complaint  Patient presents with  . Follow-up    Former PW pt. PFT completed today, 08/19/18.  Pt last seen in 05/2018 by [name], NP. Pt states he feels he is at baseline. Pt denies DOE, no cough. Pt states he rarely uses his albuterol inhaler. Pt denies any concerns at this time.   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   Sleeping as above without nocturnal  or early am exacerbation  of respiratory  c/o's or need for noct saba. Also denies any obvious fluctuation of symptoms with weather or environmental changes or other aggravating or alleviating factors except as outlined above   No unusual exposure hx or h/o childhood pna/ asthma or knowledge of premature birth.  Current Allergies, Complete Past Medical History, Past Surgical History, Family History, and Social History were reviewed in [employer] record.  ROS  The following are not active complaints unless bolded Hoarseness, sore throat, dysphagia, dental problems, itching, sneezing,  nasal congestion or discharge of excess mucus or purulent secretions, ear ache,   fever, chills, sweats, unintended wt loss or wt gain, classically pleuritic or exertional cp,  orthopnea pnd or arm/hand swelling  or leg swelling, presyncope, palpitations, abdominal pain, anorexia, nausea, vomiting, diarrhea  or change in bowel habits or  change in bladder habits, change in stools or change in urine, dysuria, hematuria,  rash, arthralgias, visual complaints, headache, numbness, weakness or ataxia or problems with walking or coordination,  change in mood or  memory.        Current Meds  Medication Sig  . amLODipine (NORVASC) 10 MG tablet Take 10 mg by mouth daily.  . DULoxetine (CYMBALTA) 30 MG capsule Take 90 mg by mouth daily.   [name] Kitchen levothyroxine (SYNTHROID, LEVOTHROID) 200 MCG tablet Take 200 mcg by mouth daily before breakfast.   . mometasone-formoterol (DULERA) 100-5 MCG/ACT AERO USE 2 INHALATIONS FIRST THING IN THE MORNING AND THEN ANOTHER 2 INHALATIONS ABOUT 12 HOURS LATER  . oxyCODONE-acetaminophen (PERCOCET) 10-325 MG tablet Take 2 tablets by mouth 4 (four) times daily.  . pantoprazole (PROTONIX) 20 MG tablet Take 20 mg by mouth daily.  . ranitidine (ZANTAC) 150 MG tablet Take 150 mg by mouth at bedtime.  [name] Kitchen testosterone cypionate (DEPOTESTOTERONE CYPIONATE) 200 MG/ML injection Inject 100 mg into the muscle once a week.   . Zoledronic Acid (ZOMETA IV) Inject into the vein every 3 (three) months.  . [DISCONTINUED] DULERA 100-5 MCG/ACT AERO USE 2 INHALATIONS FIRST THING IN THE MORNING AND THEN ANOTHER 2 INHALATIONS ABOUT 12 HOURS LATER               Objective:   Physical Exam  08/19/2018        213  10/16/2015      191   09/04/15 188 lb (85.276 kg)  09/04/15 187 lb 9.6 oz (85.095 kg)  07/30/15 187 lb 4.8 oz (84.959 kg)      amb wm nad  Vital signs reviewed - Note on arrival 02 sats  95% on RA   HEENT: nl  , turbinates bilaterally, and oropharynx. Nl external ear canals without cough reflex  - full dentures    NECK :  without JVD/Nodes/TM/ nl carotid upstrokes bilaterally   LUNGS: no acc muscle use,  Nl contour chest which is clear to A and P bilaterally without cough on insp or exp maneuvers   CV:  RRR  no s3 or murmur or increase in P2, and no edema   ABD:  soft and nontender with nl inspiratory  excursion in the supine position. No bruits or organomegaly appreciated, bowel sounds nl  MS:  Nl gait/ ext warm without deformities, calf tenderness, cyanosis or clubbing No obvious joint restrictions   SKIN: warm and dry without lesions    NEURO:  alert, approp, nl sensorium with  no motor or  cerebellar deficits apparent.             Assessment & Plan:

## 2018-08-20 DIAGNOSIS — Z23 Encounter for immunization: Secondary | ICD-10-CM | POA: Diagnosis not present

## 2018-08-24 DIAGNOSIS — E039 Hypothyroidism, unspecified: Secondary | ICD-10-CM | POA: Diagnosis not present

## 2018-08-27 DIAGNOSIS — M810 Age-related osteoporosis without current pathological fracture: Secondary | ICD-10-CM | POA: Diagnosis not present

## 2018-08-27 DIAGNOSIS — M8580 Other specified disorders of bone density and structure, unspecified site: Secondary | ICD-10-CM | POA: Diagnosis not present

## 2018-09-13 DIAGNOSIS — M150 Primary generalized (osteo)arthritis: Secondary | ICD-10-CM | POA: Diagnosis not present

## 2018-09-13 DIAGNOSIS — M40204 Unspecified kyphosis, thoracic region: Secondary | ICD-10-CM | POA: Diagnosis not present

## 2018-09-13 DIAGNOSIS — G894 Chronic pain syndrome: Secondary | ICD-10-CM | POA: Diagnosis not present

## 2018-09-13 DIAGNOSIS — Z79891 Long term (current) use of opiate analgesic: Secondary | ICD-10-CM | POA: Diagnosis not present

## 2018-10-21 DIAGNOSIS — M19041 Primary osteoarthritis, right hand: Secondary | ICD-10-CM | POA: Diagnosis not present

## 2018-10-21 DIAGNOSIS — M65341 Trigger finger, right ring finger: Secondary | ICD-10-CM | POA: Diagnosis not present

## 2018-10-21 DIAGNOSIS — M19042 Primary osteoarthritis, left hand: Secondary | ICD-10-CM | POA: Diagnosis not present

## 2018-10-22 DIAGNOSIS — E039 Hypothyroidism, unspecified: Secondary | ICD-10-CM | POA: Diagnosis not present

## 2018-11-08 DIAGNOSIS — G894 Chronic pain syndrome: Secondary | ICD-10-CM | POA: Diagnosis not present

## 2018-11-08 DIAGNOSIS — M40204 Unspecified kyphosis, thoracic region: Secondary | ICD-10-CM | POA: Diagnosis not present

## 2018-11-08 DIAGNOSIS — M150 Primary generalized (osteo)arthritis: Secondary | ICD-10-CM | POA: Diagnosis not present

## 2018-11-08 DIAGNOSIS — Z79891 Long term (current) use of opiate analgesic: Secondary | ICD-10-CM | POA: Diagnosis not present

## 2018-11-18 DIAGNOSIS — M65341 Trigger finger, right ring finger: Secondary | ICD-10-CM | POA: Diagnosis not present

## 2018-11-19 DIAGNOSIS — C61 Malignant neoplasm of prostate: Secondary | ICD-10-CM | POA: Diagnosis not present

## 2018-11-19 DIAGNOSIS — M810 Age-related osteoporosis without current pathological fracture: Secondary | ICD-10-CM | POA: Diagnosis not present

## 2018-12-06 DIAGNOSIS — I10 Essential (primary) hypertension: Secondary | ICD-10-CM | POA: Diagnosis not present

## 2018-12-06 DIAGNOSIS — J449 Chronic obstructive pulmonary disease, unspecified: Secondary | ICD-10-CM | POA: Diagnosis not present

## 2018-12-06 DIAGNOSIS — K219 Gastro-esophageal reflux disease without esophagitis: Secondary | ICD-10-CM | POA: Diagnosis not present

## 2018-12-06 DIAGNOSIS — F3341 Major depressive disorder, recurrent, in partial remission: Secondary | ICD-10-CM | POA: Diagnosis not present

## 2018-12-06 DIAGNOSIS — E039 Hypothyroidism, unspecified: Secondary | ICD-10-CM | POA: Diagnosis not present

## 2018-12-10 ENCOUNTER — Ambulatory Visit
Admission: RE | Admit: 2018-12-10 | Discharge: 2018-12-10 | Disposition: A | Discharge status: Home / Self Care | Payer: Medicare Other | Source: Ambulatory Visit | Attending: Physician Assistant | Admitting: Physician Assistant

## 2018-12-10 ENCOUNTER — Other Ambulatory Visit: Payer: Self-pay | Admitting: Physician Assistant

## 2018-12-10 DIAGNOSIS — K5900 Constipation, unspecified: Secondary | ICD-10-CM

## 2018-12-10 DIAGNOSIS — R1084 Generalized abdominal pain: Secondary | ICD-10-CM | POA: Diagnosis not present

## 2018-12-24 DIAGNOSIS — Z86010 Personal history of colonic polyps: Secondary | ICD-10-CM | POA: Diagnosis not present

## 2018-12-24 DIAGNOSIS — K5909 Other constipation: Secondary | ICD-10-CM | POA: Diagnosis not present

## 2019-01-10 DIAGNOSIS — M40204 Unspecified kyphosis, thoracic region: Secondary | ICD-10-CM | POA: Diagnosis not present

## 2019-01-10 DIAGNOSIS — G894 Chronic pain syndrome: Secondary | ICD-10-CM | POA: Diagnosis not present

## 2019-01-10 DIAGNOSIS — M150 Primary generalized (osteo)arthritis: Secondary | ICD-10-CM | POA: Diagnosis not present

## 2019-01-10 DIAGNOSIS — Z79891 Long term (current) use of opiate analgesic: Secondary | ICD-10-CM | POA: Diagnosis not present

## 2019-03-07 DIAGNOSIS — M40204 Unspecified kyphosis, thoracic region: Secondary | ICD-10-CM | POA: Diagnosis not present

## 2019-03-07 DIAGNOSIS — Z79891 Long term (current) use of opiate analgesic: Secondary | ICD-10-CM | POA: Diagnosis not present

## 2019-03-07 DIAGNOSIS — M150 Primary generalized (osteo)arthritis: Secondary | ICD-10-CM | POA: Diagnosis not present

## 2019-03-07 DIAGNOSIS — G894 Chronic pain syndrome: Secondary | ICD-10-CM | POA: Diagnosis not present

## 2019-04-27 DIAGNOSIS — R351 Nocturia: Secondary | ICD-10-CM | POA: Diagnosis not present

## 2019-04-27 DIAGNOSIS — R972 Elevated prostate specific antigen [PSA]: Secondary | ICD-10-CM | POA: Diagnosis not present

## 2019-04-27 DIAGNOSIS — E291 Testicular hypofunction: Secondary | ICD-10-CM | POA: Diagnosis not present

## 2019-04-27 DIAGNOSIS — N401 Enlarged prostate with lower urinary tract symptoms: Secondary | ICD-10-CM | POA: Diagnosis not present

## 2019-04-29 ENCOUNTER — Other Ambulatory Visit: Payer: Self-pay | Admitting: Gastroenterology

## 2019-04-29 ENCOUNTER — Ambulatory Visit
Admission: RE | Admit: 2019-04-29 | Discharge: 2019-04-29 | Disposition: A | Discharge status: Home / Self Care | Payer: Medicare Other | Source: Ambulatory Visit | Attending: Gastroenterology | Admitting: Gastroenterology

## 2019-04-29 DIAGNOSIS — R1084 Generalized abdominal pain: Secondary | ICD-10-CM | POA: Diagnosis not present

## 2019-04-29 DIAGNOSIS — K449 Diaphragmatic hernia without obstruction or gangrene: Secondary | ICD-10-CM | POA: Diagnosis not present

## 2019-04-29 DIAGNOSIS — I700 Atherosclerosis of aorta: Secondary | ICD-10-CM | POA: Diagnosis not present

## 2019-04-29 DIAGNOSIS — R1013 Epigastric pain: Secondary | ICD-10-CM

## 2019-04-29 DIAGNOSIS — E039 Hypothyroidism, unspecified: Secondary | ICD-10-CM | POA: Diagnosis not present

## 2019-04-29 MED ORDER — IOPAMIDOL (ISOVUE-300) INJECTION 61%
100.0000 mL | Freq: Once | INTRAVENOUS | Status: AC | PRN
  Administered 2019-04-29: 10:00:00 100 mL via INTRAVENOUS

## 2019-05-02 DIAGNOSIS — M40204 Unspecified kyphosis, thoracic region: Secondary | ICD-10-CM | POA: Diagnosis not present

## 2019-05-02 DIAGNOSIS — M150 Primary generalized (osteo)arthritis: Secondary | ICD-10-CM | POA: Diagnosis not present

## 2019-05-02 DIAGNOSIS — Z79891 Long term (current) use of opiate analgesic: Secondary | ICD-10-CM | POA: Diagnosis not present

## 2019-05-02 DIAGNOSIS — G894 Chronic pain syndrome: Secondary | ICD-10-CM | POA: Diagnosis not present

## 2019-05-03 DIAGNOSIS — R1011 Right upper quadrant pain: Secondary | ICD-10-CM | POA: Diagnosis not present

## 2019-05-03 DIAGNOSIS — K219 Gastro-esophageal reflux disease without esophagitis: Secondary | ICD-10-CM | POA: Diagnosis not present

## 2019-05-03 DIAGNOSIS — L7682 Other postprocedural complications of skin and subcutaneous tissue: Secondary | ICD-10-CM | POA: Diagnosis not present

## 2019-05-03 DIAGNOSIS — R1013 Epigastric pain: Secondary | ICD-10-CM | POA: Diagnosis not present

## 2019-05-20 ENCOUNTER — Telehealth: Payer: Self-pay | Admitting: Internal Medicine

## 2019-05-20 NOTE — Telephone Encounter (Signed)
Returned all and spoke to wife.  Dry cough and sore throat for 2-3 days.  Denies fever but has aching joints but wife states this is not unusual for the patient due to his arthritis.  Denies sinus symptoms and is using Dulera once daily.  No OTC meds being taken for these symptoms.  Asked about exposure to covid and wife states she had her hair cut Monday 05/16/19 and the hair shop did follow masking precautions and no known exposure to covid but wife states she has also 'not felt good the past couple of days with dry cough and headache.  Denies fever.    Primary Pulmonologist: Wert Last office visit and with whom: 08/20/19 Wert What do we see them for (pulmonary problems): COPD/Chronic bronchitis  Reason for call: dry cough/sore throat 2-3 days  In the last month, have you been in contact with someone who was confirmed or suspected to have Conoravirus / COVID-19?  denies  Do you have any of the following symptoms developed in the last 30 days? Fever: denie Cough: yes - dry Shortness of breath: minimal  When did your symptoms start?  2-3 days ago   Will route to Dr. Melvyn Novas for review and recommendations.

## 2019-05-20 NOTE — Telephone Encounter (Signed)
Returned call to wife and patient.  Advised of Dr. Gustavus Bryant recommendation.  Patient states he does not want to go to ED stating he would sit in the lobby for hours only to be told the covid test was negative and then won't know how to treat his condition.  Explained to them that if there are concerns by the provider regarding a covid infection we were not having patients come into the office and that Dr. Gustavus Bryant recommendation to seek an evaluation in the ED at [facility] is to make sure the patient receives the testing and has an in-person evaluation.    Patient states he most likely will not go to the ED.  Offered a virtual visit with an NP today but could not guarantee they would not advise the same since Dr. Melvyn Novas has reviewed this information.  Patient declined.  Advised to contact us if they change their mind on a virtual visit or have other questions.  Acknowledged understanding.  Encounter closed.

## 2019-05-20 NOTE — Telephone Encounter (Signed)
Can't make any dx over the phone but really should be checked for covid 19 prior to weekend so rec go to [facility] ER for eval

## 2019-05-27 DIAGNOSIS — E291 Testicular hypofunction: Secondary | ICD-10-CM | POA: Diagnosis not present

## 2019-05-27 DIAGNOSIS — M8580 Other specified disorders of bone density and structure, unspecified site: Secondary | ICD-10-CM | POA: Diagnosis not present

## 2019-05-30 DIAGNOSIS — I10 Essential (primary) hypertension: Secondary | ICD-10-CM | POA: Diagnosis not present

## 2019-05-30 DIAGNOSIS — K219 Gastro-esophageal reflux disease without esophagitis: Secondary | ICD-10-CM | POA: Diagnosis not present

## 2019-05-30 DIAGNOSIS — J449 Chronic obstructive pulmonary disease, unspecified: Secondary | ICD-10-CM | POA: Diagnosis not present

## 2019-05-30 DIAGNOSIS — R635 Abnormal weight gain: Secondary | ICD-10-CM | POA: Diagnosis not present

## 2019-05-30 DIAGNOSIS — F322 Major depressive disorder, single episode, severe without psychotic features: Secondary | ICD-10-CM | POA: Diagnosis not present

## 2019-05-30 DIAGNOSIS — E039 Hypothyroidism, unspecified: Secondary | ICD-10-CM | POA: Diagnosis not present

## 2019-06-14 DIAGNOSIS — Z8521 Personal history of malignant neoplasm of larynx: Secondary | ICD-10-CM | POA: Diagnosis not present

## 2019-06-15 DIAGNOSIS — K219 Gastro-esophageal reflux disease without esophagitis: Secondary | ICD-10-CM | POA: Diagnosis not present

## 2019-06-15 DIAGNOSIS — Z1389 Encounter for screening for other disorder: Secondary | ICD-10-CM | POA: Diagnosis not present

## 2019-06-15 DIAGNOSIS — I10 Essential (primary) hypertension: Secondary | ICD-10-CM | POA: Diagnosis not present

## 2019-06-15 DIAGNOSIS — F322 Major depressive disorder, single episode, severe without psychotic features: Secondary | ICD-10-CM | POA: Diagnosis not present

## 2019-06-15 DIAGNOSIS — J449 Chronic obstructive pulmonary disease, unspecified: Secondary | ICD-10-CM | POA: Diagnosis not present

## 2019-06-15 DIAGNOSIS — Z0000 Encounter for general adult medical examination without abnormal findings: Secondary | ICD-10-CM | POA: Diagnosis not present

## 2019-06-27 DIAGNOSIS — M40204 Unspecified kyphosis, thoracic region: Secondary | ICD-10-CM | POA: Diagnosis not present

## 2019-06-27 DIAGNOSIS — Z79891 Long term (current) use of opiate analgesic: Secondary | ICD-10-CM | POA: Diagnosis not present

## 2019-06-27 DIAGNOSIS — G894 Chronic pain syndrome: Secondary | ICD-10-CM | POA: Diagnosis not present

## 2019-06-27 DIAGNOSIS — M150 Primary generalized (osteo)arthritis: Secondary | ICD-10-CM | POA: Diagnosis not present

## 2019-06-29 ENCOUNTER — Emergency Department (HOSPITAL_COMMUNITY)
Admission: EM | Admit: 2019-06-29 | Discharge: 2019-06-29 | Disposition: A | Discharge status: Home / Self Care | Payer: Medicare Other | Attending: Emergency Medicine | Admitting: Emergency Medicine

## 2019-06-29 ENCOUNTER — Other Ambulatory Visit: Payer: Self-pay

## 2019-06-29 ENCOUNTER — Encounter (HOSPITAL_COMMUNITY): Payer: Self-pay | Admitting: Emergency Medicine

## 2019-06-29 ENCOUNTER — Emergency Department (HOSPITAL_COMMUNITY): Payer: Medicare Other

## 2019-06-29 DIAGNOSIS — Z87891 Personal history of nicotine dependence: Secondary | ICD-10-CM | POA: Insufficient documentation

## 2019-06-29 DIAGNOSIS — I10 Essential (primary) hypertension: Secondary | ICD-10-CM | POA: Diagnosis not present

## 2019-06-29 DIAGNOSIS — E039 Hypothyroidism, unspecified: Secondary | ICD-10-CM | POA: Insufficient documentation

## 2019-06-29 DIAGNOSIS — K449 Diaphragmatic hernia without obstruction or gangrene: Secondary | ICD-10-CM | POA: Diagnosis not present

## 2019-06-29 DIAGNOSIS — R0602 Shortness of breath: Secondary | ICD-10-CM

## 2019-06-29 DIAGNOSIS — Z79899 Other long term (current) drug therapy: Secondary | ICD-10-CM | POA: Diagnosis not present

## 2019-06-29 DIAGNOSIS — Z8521 Personal history of malignant neoplasm of larynx: Secondary | ICD-10-CM | POA: Insufficient documentation

## 2019-06-29 DIAGNOSIS — R110 Nausea: Secondary | ICD-10-CM | POA: Insufficient documentation

## 2019-06-29 DIAGNOSIS — Z923 Personal history of irradiation: Secondary | ICD-10-CM | POA: Diagnosis not present

## 2019-06-29 DIAGNOSIS — R1013 Epigastric pain: Secondary | ICD-10-CM

## 2019-06-29 LAB — COMPREHENSIVE METABOLIC PANEL
ALT: 18 U/L (ref 0–44)
AST: 21 U/L (ref 15–41)
Albumin: 4.3 g/dL (ref 3.5–5.0)
Alkaline Phosphatase: 24 U/L — ABNORMAL LOW (ref 38–126)
Anion gap: 12 (ref 5–15)
BUN: 6 mg/dL — ABNORMAL LOW (ref 8–23)
CO2: 24 mmol/L (ref 22–32)
Calcium: 9.9 mg/dL (ref 8.9–10.3)
Chloride: 99 mmol/L (ref 98–111)
Creatinine, Ser: 0.96 mg/dL (ref 0.61–1.24)
GFR calc Af Amer: >60 mL/min (ref >60)
GFR calc non Af Amer: >60 mL/min (ref >60)
Glucose, Bld: 105 mg/dL — ABNORMAL HIGH (ref 70–99)
Potassium: 3.7 mmol/L (ref 3.5–5.1)
Sodium: 135 mmol/L (ref 135–145)
Total Bilirubin: 1.4 mg/dL — ABNORMAL HIGH (ref 0.3–1.2)
Total Protein: 7.5 g/dL (ref 6.5–8.1)

## 2019-06-29 LAB — CBC
HCT: 39.5 % (ref 39.0–52.0)
Hemoglobin: 12.6 g/dL — ABNORMAL LOW (ref 13.0–17.0)
MCH: 29.6 pg (ref 26.0–34.0)
MCHC: 31.9 g/dL (ref 30.0–36.0)
MCV: 92.7 fL (ref 80.0–100.0)
Platelets: 164 10*3/uL (ref 150–400)
RBC: 4.26 MIL/uL (ref 4.22–5.81)
RDW: 15.8 % — ABNORMAL HIGH (ref 11.5–15.5)
WBC: 6.3 10*3/uL (ref 4.0–10.5)
nRBC: 0 % (ref 0.0–0.2)

## 2019-06-29 LAB — TROPONIN I (HIGH SENSITIVITY)
Troponin I (High Sensitivity): 4 ng/L (ref <18)
Troponin I (High Sensitivity): 4 ng/L (ref <18)

## 2019-06-29 MED ORDER — IOHEXOL 300 MG/ML  SOLN
100.0000 mL | Freq: Once | INTRAMUSCULAR | Status: AC | PRN
  Administered 2019-06-29: 100 mL via INTRAVENOUS

## 2019-06-29 NOTE — ED Notes (Signed)
Dr. Tamera Punt and Mickel Baas PA at bedside.

## 2019-06-29 NOTE — Discharge Instructions (Addendum)
Follow-up with your gastroenterologist regarding your CT results today. Follow-up with general surgery as scheduled. Return to the emergency room for any new or worsening symptoms.

## 2019-06-29 NOTE — ED Triage Notes (Signed)
Pt here with c/o upper abd pain and sob , pt has been seen for this by his primary are provider , pt has a known hernia

## 2019-06-29 NOTE — ED Notes (Signed)
Patient transported to CT 

## 2019-06-29 NOTE — ED Provider Notes (Signed)
[facility] EMERGENCY DEPARTMENT Provider Note   CSN: [id number] Arrival date & time: 06/29/19  2119    History   Chief Complaint Chief Complaint  Patient presents with   Abdominal Pain   Shortness of Breath    HPI [name] is a 73 y.o. male.     73yo male with past medical history of hiatal hernia, GERD, malignant neoplasm of the glottis, Crohn's disease, presents with complaint of epigastric discomfort and shortness of breath.  Patient states he is had these symptoms for several months, progressively worsening.  Patient states last night's episode is the worst that he has had so far, states that he was so short of breath lying down to sleep that he could not sleep and had to sit upright.  Symptoms improved while sitting upright however persisted.  Pain is worse with taking a deep breath, reports nausea without vomiting.  Patient states bowels are normal for him, denies fevers, chills, chest pain.  Patient has been seen by GI, had a CT scan in May showing a large hiatal hernia, has been referred to general surgery and appointment is scheduled in 2 weeks- unsure if this is for scar tissue for previous open cholecystectomy or for his hiatal hernia. Patient has had multiple abdominal surgeries, no prior SBO. No other complaints or concerns.      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   Tobacco chew use     Patient  Active Problem List   Diagnosis Date Noted   Fever in adult 05/14/2018   Chest tightness    Altered bowel function 03/11/2018   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   Major depression, single episode 03/11/2018   Malignant neoplasm of glottis ([place]) 03/11/2018   Osteoarthritis 03/11/2018   Recurrent major depressive disorder, in partial remission ([place]) 03/11/2018   Sinusitis 03/11/2018   Ex-cigarette smoker 03/11/2018   Shortness of breath on exertion 03/11/2018   History of cholecystectomy 12/03/2016   Hypertension 12/03/2016   Chronic pain 12/03/2016   GERD (gastroesophageal reflux disease) 12/03/2016   Hiatal hernia 10/30/2016   Osteoporosis 08/11/2016   History of laryngeal cancer 04/18/2016   Hoarseness 04/18/2016   Status post radiation therapy 04/18/2016   Tobacco use disorder 07/30/2015   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   Abnormal nuclear cardiac imaging test 11/09/2012   Hypogonadism male 02/24/2012   Crohn's colitis ([place]) 02/24/2012   ALLERGIC RHINITIS 10/22/2010   Hypothyroidism 09/16/2007   Anxiety state 09/16/2007   DEPRESSION 09/16/2007   Bronchitis, chronic obstructive w acute bronchitis ([place]) 09/16/2007   Lung nodule 09/16/2007    Past Surgical History:  Procedure Laterality Date   ABDOMINAL SURGERY  1971   terminal ileum removed, ileitis    Biospy of the Right True Vocal Cord Right 04/23/2015   CHOLECYSTECTOMY N/A 10/31/2016   Procedure: OPEN CHOLECYSTECTOMY;  Surgeon: Rolm Bookbinder, MD;  Location: [facility];  Service: General;  Laterality: N/A;  LEFT HEART CATH AND CORONARY ANGIOGRAPHY N/A 03/31/2018   Procedure: LEFT HEART CATH AND CORONARY ANGIOGRAPHY;  Surgeon: Troy Sine, MD;  Location: [facility];  Service: Cardiovascular;  Laterality: N/A;   MICROLARYNGOSCOPY Right 04/23/2015   Procedure: MICROLARYNGOSCOPY WITH BIOPSY RIGHT TRUE VOCAL CORD;  Surgeon: Izora Gala, MD;   Location: [facility];  Service: ENT;  Laterality: Right;   SHOULDER SURGERY     TONSILLECTOMY     as child        Home Medications    Prior to Admission medications   Medication Sig Start Date End Date Taking? Authorizing Provider  amLODipine (NORVASC) 10 MG tablet Take 10 mg by mouth daily. 06/06/16   [provider]  DULoxetine (CYMBALTA) 30 MG capsule Take 90 mg by mouth daily.     [provider]  levothyroxine (SYNTHROID, LEVOTHROID) 200 MCG tablet Take 200 mcg by mouth daily before breakfast.  03/04/18   [provider]  mometasone-formoterol (DULERA) 100-5 MCG/ACT AERO USE 2 INHALATIONS FIRST THING IN THE MORNING AND THEN ANOTHER 2 INHALATIONS ABOUT 12 HOURS LATER 08/19/18   Tanda Rockers, MD  oxyCODONE-acetaminophen (PERCOCET) 10-325 MG tablet Take 2 tablets by mouth 4 (four) times daily.    [provider]  pantoprazole (PROTONIX) 20 MG tablet Take 20 mg by mouth daily.    [provider]  ranitidine (ZANTAC) 150 MG tablet Take 150 mg by mouth at bedtime.    [provider]  testosterone cypionate (DEPOTESTOTERONE CYPIONATE) 200 MG/ML injection Inject 100 mg into the muscle once a week.     [provider]  Zoledronic Acid (ZOMETA IV) Inject into the vein every 3 (three) months.    [provider]    Family History Family History  Problem Relation Age of Onset   Prostate cancer Father    Lung cancer Father    Allergies Sister     Social History Social History   Tobacco Use   Smoking status: Former Smoker    Packs/day: 2.00    Years: 43.00    Pack years: 86.00    Types: Cigarettes    Quit date: 12/01/2001    Years since quitting: 17.5   Smokeless tobacco: Never Used   Tobacco comment: 2ppd x 43 years.  Still uses nicotine gum. Quit smoking 13 years ago  Substance Use Topics   Alcohol use: No    Alcohol/week: 0.0 standard drinks   Drug use: No     Allergies     Doxycycline, Guaifenesin, Morphine sulfate, and Tapentadol   Review of Systems Review of Systems  Constitutional: Negative for fever.  Respiratory: Positive for shortness of breath. Negative for wheezing.   Cardiovascular: Negative for chest pain.  Gastrointestinal: Positive for abdominal pain and nausea. Negative for constipation, diarrhea and vomiting.  Genitourinary: Negative for difficulty urinating.  Musculoskeletal: Negative for arthralgias and myalgias.  Skin: Negative for rash and wound.  Psychiatric/Behavioral: Negative for confusion.  All other systems reviewed and are negative.    Physical Exam Updated Vital Signs BP (!) 157/97    Pulse 78    Temp 98.6 F (37 C) (Oral)    Resp 18    SpO2 97%   Physical Exam Vitals signs and nursing note reviewed.  Constitutional:      General: He is not in acute distress.    Appearance: He is well-developed. He is not diaphoretic.  HENT:     Head: Normocephalic and atraumatic.  Cardiovascular:  Rate and Rhythm: Normal rate and regular rhythm.     Heart sounds: Normal heart sounds. No murmur.  Pulmonary:     Effort: Pulmonary effort is normal.     Breath sounds: Normal breath sounds.  Abdominal:     General: A surgical scar is present.     Palpations: Abdomen is soft.     Tenderness: There is abdominal tenderness in the epigastric area.  Skin:    General: Skin is warm and dry.     Findings: No erythema or rash.  Neurological:     Mental Status: He is alert and oriented to person, place, and time.  Psychiatric:        Behavior: Behavior normal.      ED Treatments / Results  Labs (all labs ordered are listed, but only abnormal results are displayed) Labs Reviewed  CBC - Abnormal; Notable for the following components:      Result Value   Hemoglobin 12.6 (*)    RDW 15.8 (*)    All other components within normal limits  COMPREHENSIVE METABOLIC PANEL - Abnormal; Notable for the following components:   Glucose, Bld  105 (*)    BUN 6 (*)    Alkaline Phosphatase 24 (*)    Total Bilirubin 1.4 (*)    All other components within normal limits  TROPONIN I (HIGH SENSITIVITY)  TROPONIN I (HIGH SENSITIVITY)    EKG EKG Interpretation  Date/Time:  Wednesday June 29 2019 08:10:56 EDT Ventricular Rate:  67 PR Interval:  196 QRS Duration: 90 QT Interval:  408 QTC Calculation: 431 R Axis:   29 Text Interpretation:  Poor data quality, interpretation may be adversely affected Normal sinus rhythm Normal ECG since last tracing no significant change Confirmed by Malvin Johns [phone]) on 06/29/2019 1:08:58 PM  Radiology Dg Chest 2 View  Result Date: 06/29/2019 CLINICAL DATA:  Shortness of breath EXAM: CHEST - 2 VIEW COMPARISON:  May 13, 2018 chest radiograph and CT abdomen and pelvis including lung bases Apr 29, 2019 FINDINGS: There is a suspected nipple shadow on the left. There is a calcified granuloma in the right middle lobe, better seen on lateral view. No edema or consolidation. Heart size and pulmonary vascularity are normal. No adenopathy. There is degenerative change in the thoracic spine. IMPRESSION: Suspect nipple shadow on the left; advise repeat study with nipple markers to confirm. Calcified granuloma right middle lobe anteriorly. No edema or consolidation. Heart size within normal limits. No adenopathy evident. Electronically Signed   By: Lowella Grip III M.D.   On: 06/29/2019 08:42   Ct Abdomen Pelvis W Contrast  Result Date: 06/29/2019 CLINICAL DATA:  Upper abdominal pain, shortness of breath EXAM: CT ABDOMEN AND PELVIS WITH CONTRAST TECHNIQUE: Multidetector CT imaging of the abdomen and pelvis was performed using the standard protocol following bolus administration of intravenous contrast. CONTRAST:  167mL OMNIPAQUE IOHEXOL 300 MG/ML  SOLN COMPARISON:  04/29/2019, 10/30/2016 FINDINGS: Lower chest: No acute abnormality. Stable, benign 1.1 cm pulmonary nodule of the left lung base. Moderate hiatal  hernia. Hepatobiliary: No solid liver abnormality is seen. Status post partial cholecystectomy. No gallbladder wall thickening. Mild biliary ductal dilatation, unchanged from prior. Pancreas: Unremarkable. No pancreatic ductal dilatation or surrounding inflammatory changes. Spleen: Normal in size without significant abnormality. Adrenals/Urinary Tract: Adrenal glands are unremarkable. Exophytic simple cyst of the superior pole of the left kidney. Kidneys are normal, without renal calculi, solid lesion, or hydronephrosis. Thickening of the urinary bladder, likely due to chronic outlet obstruction.  Stomach/Bowel: Stomach is within normal limits. Appendix is not clearly visualized. There is mild, diffuse inflammatory thickening and vascular combing of the colon, most conspicuous in the descending and sigmoid (series 5, image 56). Vascular/Lymphatic: Aortic atherosclerosis. No enlarged abdominal or pelvic lymph nodes. Reproductive: Prostatomegaly. Other: No abdominal wall hernia or abnormality. No abdominopelvic ascites. Musculoskeletal: No acute or significant osseous findings. IMPRESSION: 1. There is mild, diffuse inflammatory thickening and vascular combing of the colon, most conspicuous in the descending and sigmoid (series 5, image 56). 2. Prostatomegaly. Thickening of the urinary bladder, likely due to chronic outlet obstruction. 3.  Other chronic and incidental findings as detailed above. Electronically Signed   By: Eddie Candle M.D.   On: 06/29/2019 14:11    Procedures Procedures (including critical care time)  Medications Ordered in ED Medications  iohexol (OMNIPAQUE) 300 MG/ML solution 100 mL (100 mLs Intravenous Contrast Given 06/29/19 1330)     Initial Impression / Assessment and Plan / ED Course  I have reviewed the triage vital signs and the nursing notes.  Pertinent labs & imaging results that were available during my care of the patient were reviewed by me and considered in my medical  decision making (see chart for details).  Clinical Course as of Jun 29 1519  Wed Jun 28, 1760  768 73 year old male presents with complaint of shortness of breath with epigastric pain.  Patient states this problem is been progressively worsening over the past several months, known hiatal hernia, awaiting consult with surgery in 2 weeks.  Patient states last night his symptoms were worse than he had been previously, states that he could not lie flat without feeling short of breath, symptoms improved with sitting upright.  Patient feels better at this time.  Patient has multiple previous abdominal surgeries, CT abdomen pelvis does not show small bowel obstruction.  CT findings consistent with known Crohn's disease.  Review of lab work shows CBC with mild anemia with hemoglobin of 12.6.  CMP with elevated total bili at 1.4 otherwise unremarkable.  Troponin x1-.  Case discussed with Dr. Doree Fudge, ER attending who has seen the patient, agrees with plan of care.  Patient to follow-up with surgery as scheduled, advised to contact his GI for their review of his CT report, as patient states that he has never had findings of Crohn's on previous CT or colonoscopy.   [LM]  1519 Patient to return to ER for any new or worsening symptoms.   [LM]    Clinical Course User Index [LM] Tacy Learn, PA-C      Final Clinical Impressions(s) / ED Diagnoses   Final diagnoses:  Shortness of breath  Epigastric pain  Hiatal hernia    ED Discharge Orders    None       Tacy Learn, PA-C 06/29/19 1521    Malvin Johns, MD 06/30/19 [phone]

## 2019-07-19 ENCOUNTER — Encounter: Payer: Self-pay | Admitting: Surgery

## 2019-07-19 DIAGNOSIS — R1013 Epigastric pain: Secondary | ICD-10-CM | POA: Diagnosis not present

## 2019-07-19 DIAGNOSIS — K5909 Other constipation: Secondary | ICD-10-CM | POA: Diagnosis not present

## 2019-07-19 DIAGNOSIS — K5090 Crohn's disease, unspecified, without complications: Secondary | ICD-10-CM | POA: Diagnosis not present

## 2019-07-20 ENCOUNTER — Other Ambulatory Visit: Payer: Self-pay | Admitting: Surgery

## 2019-07-20 DIAGNOSIS — R1013 Epigastric pain: Secondary | ICD-10-CM

## 2019-07-22 ENCOUNTER — Other Ambulatory Visit (HOSPITAL_COMMUNITY): Payer: Self-pay | Admitting: Surgery

## 2019-07-22 DIAGNOSIS — R1013 Epigastric pain: Secondary | ICD-10-CM

## 2019-08-01 ENCOUNTER — Other Ambulatory Visit: Payer: Self-pay

## 2019-08-01 ENCOUNTER — Encounter (HOSPITAL_COMMUNITY)
Admission: RE | Admit: 2019-08-01 | Discharge: 2019-08-01 | Disposition: A | Discharge status: Home / Self Care | Payer: Medicare Other | Source: Ambulatory Visit | Attending: Surgery | Admitting: Surgery

## 2019-08-01 DIAGNOSIS — R1013 Epigastric pain: Secondary | ICD-10-CM | POA: Insufficient documentation

## 2019-08-01 DIAGNOSIS — R1011 Right upper quadrant pain: Secondary | ICD-10-CM | POA: Diagnosis not present

## 2019-08-01 MED ORDER — TECHNETIUM TC 99M MEBROFENIN IV KIT
5.3000 | PACK | Freq: Once | INTRAVENOUS | Status: AC | PRN
  Administered 2019-08-01: 08:00:00 5.3 via INTRAVENOUS

## 2019-08-03 ENCOUNTER — Other Ambulatory Visit: Payer: Self-pay

## 2019-08-03 ENCOUNTER — Ambulatory Visit (HOSPITAL_COMMUNITY)
Admission: RE | Admit: 2019-08-03 | Discharge: 2019-08-03 | Disposition: A | Discharge status: Home / Self Care | Payer: Medicare Other | Source: Ambulatory Visit | Attending: Surgery | Admitting: Surgery

## 2019-08-03 DIAGNOSIS — R1013 Epigastric pain: Secondary | ICD-10-CM | POA: Insufficient documentation

## 2019-08-03 MED ORDER — TECHNETIUM TC 99M SULFUR COLLOID
2.1000 | Freq: Once | INTRAVENOUS | Status: AC | PRN
  Administered 2019-08-03: 09:00:00 2.1 via INTRAVENOUS

## 2019-08-22 DIAGNOSIS — Z79891 Long term (current) use of opiate analgesic: Secondary | ICD-10-CM | POA: Diagnosis not present

## 2019-08-22 DIAGNOSIS — M150 Primary generalized (osteo)arthritis: Secondary | ICD-10-CM | POA: Diagnosis not present

## 2019-08-22 DIAGNOSIS — G894 Chronic pain syndrome: Secondary | ICD-10-CM | POA: Diagnosis not present

## 2019-08-22 DIAGNOSIS — M40204 Unspecified kyphosis, thoracic region: Secondary | ICD-10-CM | POA: Diagnosis not present

## 2019-08-23 ENCOUNTER — Ambulatory Visit (INDEPENDENT_AMBULATORY_CARE_PROVIDER_SITE_OTHER): Payer: Medicare Other | Admitting: Internal Medicine

## 2019-08-23 ENCOUNTER — Other Ambulatory Visit: Payer: Self-pay

## 2019-08-23 ENCOUNTER — Encounter: Payer: Self-pay | Admitting: Internal Medicine

## 2019-08-23 DIAGNOSIS — R911 Solitary pulmonary nodule: Secondary | ICD-10-CM | POA: Diagnosis not present

## 2019-08-23 DIAGNOSIS — Z23 Encounter for immunization: Secondary | ICD-10-CM | POA: Diagnosis not present

## 2019-08-23 DIAGNOSIS — J440 Chronic obstructive pulmonary disease with acute lower respiratory infection: Secondary | ICD-10-CM | POA: Diagnosis not present

## 2019-08-23 NOTE — Progress Notes (Signed)
Subjective:   Patient ID: [name], male    DOB: [birth year]   MRN: [id number]  Brief patient profile:  11 yowm quit smoking 2003 with minimal airflow obst on pfts 09/09/15 c/w GOLD 0 copd previously followed by [name] and repeated pfts 08/19/2018 still GOLD 0     History of Present Illness  07/30/2015 final [name] ov: Chief Complaint  Patient presents with  . 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  Hoarseness with radiation.  Occas chest tightness, but overall feels breathing is doing well. No cough.    Pt dx vocal cord Ca in 04/2015. Pt had more hoarseness.  Now with XRT.  Last Rx coming up.   [name] is ENT.  No LN Stage I .  Small CA seen.  Not much cough  rec You will be referred to the Lung cancer screening program No change in medicaitons Return 4 months with [name]    09/04/2015 acute extended ov/[name] re: aecopd / early pna on dulera 100 2bid  Chief Complaint  Patient presents with  . Acute Visit    Pt c/o increased cough, chest tightness and night sweats for the past wk.     acutely ill 08/29/15 with vomiting then diffuse bilateral cp / hacking cough  With minimal yellow sputum.  Worse days was 08/30/15 but more sob than baseline and still sever cough and sweats. Not using saba at all rec Augmentin 875 mg take one pill twice daily  X 10 days -  Prednisone 10 mg take  4 each am x 2 days,   2 each am x 2 days,  1 each am x 2 days and stop     10/16/2015  f/u ov/[name] re: COPD GOLD 0/ f/u lung nodule maint on dulera 100 2bid  Chief Complaint  Patient presents with  . Follow-up    Pt states that his cough has resolved. He has not needed albuterol.  No new co's today.   Not limited by breathing from desired activities  rec  When not coughing ok to leave the pm Zantac and after a few weeks if not coughing stop the protonix and take zantac after bfast x 2 weeks  At first onset of cough for any reason > Pantoprazole (protonix) 40 mg   Take  30-60  min before first meal of the day and Zantac 150 mg  one @  bedtime until return to office - this is the best way to tell whether stomach acid is contributing to your problem.      08/19/2018  f/u ov/[name] re:  Copd 0/ ab on dulera s/p aecopd seen by NP / maint on ppi q am /h2 hs and dulera 100 2bid  Chief Complaint  Patient presents with  . Follow-up    Former PW pt. PFT completed today, 08/19/18.  Pt last seen in 05/2018 by [name], NP. Pt states he feels he is at baseline. Pt denies DOE, no cough. Pt states he rarely uses his albuterol inhaler. Pt denies any concerns at this time.   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 Follow-up    Breathing is overall doing well. He mainly has issues with breathing when he bends over. He does not have a rescue inhaler.    Dyspnea:  Still able to walk neighorhood up 30 min, flat grade avg pace Cough: none Sleeping: bed is flat, on side, 1 pillow doubled over  SABA use: no 02: none No obvious day to day or daytime variability or assoc excess/ purulent sputum or mucus plugs or hemoptysis or cp or chest tightness, subjective wheeze or overt sinus or hb symptoms.   Sleeping now  without nocturnal  or early am exacerbation  of respiratory  c/o's or need for noct saba. Also denies any obvious fluctuation of symptoms with weather or environmental changes or other aggravating or alleviating factors except as outlined above   No unusual exposure hx or h/o childhood pna/ asthma or knowledge of premature birth.  Current Allergies, Complete Past Medical History, Past Surgical History, Family History, and Social History were  reviewed in [employer] record.  ROS  The following are not active complaints unless bolded Hoarseness, sore throat, dysphagia, dental problems, itching, sneezing,  nasal congestion or discharge of excess mucus or purulent secretions, ear ache,   fever, chills, sweats, unintended wt loss or wt gain, classically pleuritic or exertional cp,  orthopnea pnd or arm/hand swelling  or leg swelling, presyncope, palpitations, abdominal pain, anorexia, nausea, vomiting, diarrhea  or change in bowel habits or change in bladder habits, change in stools or change in urine, dysuria, hematuria,  rash, arthralgias, visual complaints, headache, numbness, weakness or ataxia or problems with walking or coordination,  change in mood or  memory.        Current Meds  Medication Sig  . amLODipine (NORVASC) 10 MG tablet Take 10 mg by mouth daily.  . DULoxetine (CYMBALTA) 30 MG capsule Take 90 mg by mouth daily.   [name] Kitchen levothyroxine (SYNTHROID, LEVOTHROID) 200 MCG tablet Take 200 mcg by mouth daily before breakfast.   . mometasone-formoterol (DULERA) 100-5 MCG/ACT AERO USE 2 INHALATIONS FIRST THING IN THE MORNING AND THEN ANOTHER 2 INHALATIONS ABOUT 12 HOURS LATER  . oxyCODONE-acetaminophen (PERCOCET) 10-325 MG tablet Take 2 tablets by mouth 4 (four) times daily.  . pantoprazole (PROTONIX) 20 MG tablet Take 20 mg by mouth daily.  [name] Kitchen testosterone cypionate (DEPOTESTOTERONE CYPIONATE) 200 MG/ML injection Inject 100 mg into the muscle once a week.   . Zoledronic Acid (ZOMETA IV) Inject into the vein every 3 (three) months.                      Objective:   Physical Exam  08/23/2019        223  08/19/2018        213  10/16/2015      191   09/04/15 188 lb (85.276 kg)  09/04/15 187 lb 9.6 oz (85.095 kg)  07/30/15 187 lb 4.8 oz (84.959 kg)      Vital signs reviewed - Note on arrival 02 sats  98% on RA   HEENT : pt wearing mask not removed for exam due to covid -19 concerns.   NECK :  without  JVD/Nodes/TM/ nl carotid upstrokes bilaterally   LUNGS: no acc muscle use,  Nl contour chest with minimal insp/exp rhonchi bilaterally without cough on insp or exp maneuvers   CV:  RRR  no s3 or murmur or increase in P2, and no edema   ABD:  soft and nontender with nl inspiratory excursion in the supine position. No bruits or organomegaly appreciated, bowel sounds nl  MS:  Nl gait/ ext warm without deformities, calf tenderness, cyanosis or clubbing No obvious joint restrictions   SKIN: warm and dry without lesions    NEURO:  alert, approp, nl sensorium with  no motor or cerebellar deficits apparent.                Assessment & Plan:

## 2019-08-23 NOTE — Assessment & Plan Note (Signed)
  CXR 11/08/12 Question new nodular density versus summation artifact at right upper lobe; >CT chest 11/09/2012 Patchy ground-glass attenuation in the central right upper lobe accounts for the abnormality seen on the recent chest x-ray. Thismay be related to an infectious or inflammatory alveolitis.Initial follow-up by chest CT without contrast is recommended in 3 months to confirm persistence>>tx w/ ABX and Steroids     06/08/2013 f/u CXR improved, clearance of infiltrate and benign nodule LLL persists> resolved on cxr 10/16/2015  08/24/2015 LDCT scan chest:   prob benign, LLL nodule slightly larger now 36mm.    03/16/17 LDCT 1. Lung-RADS Category 2S, benign appearance or behavior. As the patient quit smoking 15 years ago, at this point, future low-dose lung cancer screening chest CT is no longer recommended per current Guidelines   As per guidelines, no further surveillance CT's indicated

## 2019-08-23 NOTE — Assessment & Plan Note (Signed)
Quit smoking 2003 PFTs 09/08/14 min airflow obst which did not meet criteria for copd - 12/08/2013 > try change to dulera 100 2bid> improved - 08/19/2018  After extensive coaching inhaler device,  effectiveness =    90%  - PFT's  08/19/2018  FEV1 3.19 (91 % ) ratio 78  p 8 % improvement from saba p nothing prior to study with DLCO  74 % corrects to 80  % for alv volume    - The proper method of use, as well as anticipated side effects, of a metered-dose inhaler are discussed and demonstrated to the patient.  Emphasized needs to chase HFA with air just like he chases a pill with water to get the medication to the intended target  In terms of asthma, All goals of chronic asthma control met including optimal function and elimination of symptoms with minimal need for rescue therapy.  Contingencies discussed in full including contacting this office immediately if not controlling the symptoms using full dose dulera 100 (2bid)  NB Based on two studies from NEJM  378; 20 p 1865 (2018) and 380 : p2020-30 (2019) in pts with mild asthma it is reasonable to use low dose symbicort eg 80 (or by inference dulera 100)  2bid "prn" flare and takes advantage of the rapid onset of action of formoterol which is present in both meds but is not the same as "rescue therapy" but can be stopped once the acute symptoms have resolved    F/u can be q 12 m - call sooner prn  I had an extended discussion with the patient reviewing all relevant studies completed to date and  lasting 15 to 20 minutes of a 25 minute visit    I performed detailed device teaching using a teach back method which extended face to face time for this visit (see above)  Each maintenance medication was reviewed in detail including emphasizing most importantly the difference between maintenance and prns and under what circumstances the prns are to be triggered using an action plan format that is not reflected in the computer generated alphabetically organized  AVS which I have not found useful in most complex patients, especially with respiratory illnesses  Please see AVS for specific instructions unique to this visit that I personally wrote and verbalized to the the pt in detail and then reviewed with pt  by my nurse highlighting any  changes in therapy recommended at today's visit to their plan of care.

## 2019-08-23 NOTE — Patient Instructions (Signed)
No change in medications - rememeber at the first sign of a worse cough/ breathing problem immediately increase your dulera 100 to Take 2 puffs first thing in am and then another 2 puffs about 12 hours later and continue until 100% back to normal for at least a week before you taper it back to 2 pffs just in am    Please schedule a follow up visit in 17months but call sooner if needed

## 2019-08-26 DIAGNOSIS — C61 Malignant neoplasm of prostate: Secondary | ICD-10-CM | POA: Diagnosis not present

## 2019-08-26 DIAGNOSIS — M810 Age-related osteoporosis without current pathological fracture: Secondary | ICD-10-CM | POA: Diagnosis not present

## 2019-09-19 DIAGNOSIS — Z1159 Encounter for screening for other viral diseases: Secondary | ICD-10-CM | POA: Diagnosis not present

## 2019-09-22 DIAGNOSIS — K449 Diaphragmatic hernia without obstruction or gangrene: Secondary | ICD-10-CM | POA: Diagnosis not present

## 2019-09-22 DIAGNOSIS — K640 First degree hemorrhoids: Secondary | ICD-10-CM | POA: Diagnosis not present

## 2019-09-22 DIAGNOSIS — K219 Gastro-esophageal reflux disease without esophagitis: Secondary | ICD-10-CM | POA: Diagnosis not present

## 2019-09-22 DIAGNOSIS — K2900 Acute gastritis without bleeding: Secondary | ICD-10-CM | POA: Diagnosis not present

## 2019-09-22 DIAGNOSIS — R1011 Right upper quadrant pain: Secondary | ICD-10-CM | POA: Diagnosis not present

## 2019-09-22 DIAGNOSIS — K5090 Crohn's disease, unspecified, without complications: Secondary | ICD-10-CM | POA: Diagnosis not present

## 2019-09-22 DIAGNOSIS — R12 Heartburn: Secondary | ICD-10-CM | POA: Diagnosis not present

## 2019-09-27 DIAGNOSIS — K219 Gastro-esophageal reflux disease without esophagitis: Secondary | ICD-10-CM | POA: Diagnosis not present

## 2019-09-27 DIAGNOSIS — K5090 Crohn's disease, unspecified, without complications: Secondary | ICD-10-CM | POA: Diagnosis not present

## 2019-10-17 DIAGNOSIS — Z79891 Long term (current) use of opiate analgesic: Secondary | ICD-10-CM | POA: Diagnosis not present

## 2019-10-17 DIAGNOSIS — G894 Chronic pain syndrome: Secondary | ICD-10-CM | POA: Diagnosis not present

## 2019-10-17 DIAGNOSIS — M40204 Unspecified kyphosis, thoracic region: Secondary | ICD-10-CM | POA: Diagnosis not present

## 2019-10-17 DIAGNOSIS — M150 Primary generalized (osteo)arthritis: Secondary | ICD-10-CM | POA: Diagnosis not present

## 2019-12-05 DIAGNOSIS — Z8521 Personal history of malignant neoplasm of larynx: Secondary | ICD-10-CM | POA: Diagnosis not present

## 2019-12-22 ENCOUNTER — Ambulatory Visit: Payer: Medicare Other | Attending: Internal Medicine

## 2019-12-22 DIAGNOSIS — Z23 Encounter for immunization: Secondary | ICD-10-CM

## 2019-12-22 NOTE — Progress Notes (Signed)
   Covid-19 Vaccination Clinic  Name:  [name]    MRN: [id number] DOB: [birth year]  12/22/2019  [name] was observed post Covid-19 immunization for 15 minutes without incidence. He was provided with Vaccine Information Sheet and instruction to access the V-Safe system.   [name] was instructed to call 911 with any severe reactions post vaccine: [name] Kitchen Difficulty breathing  . Swelling of your face and throat  . A fast heartbeat  . A bad rash all over your body  . Dizziness and weakness    Immunizations Administered    Name Date Dose VIS Date Route   Pfizer COVID-19 Vaccine 12/22/2019  5:14 PM 0.3 mL 11/11/2019 Intramuscular   Manufacturer: [place]   Lot: [id number]   [place]: [id number]

## 2020-01-12 ENCOUNTER — Ambulatory Visit: Payer: Medicare Other | Attending: Internal Medicine

## 2020-01-12 DIAGNOSIS — Z23 Encounter for immunization: Secondary | ICD-10-CM | POA: Insufficient documentation

## 2020-01-12 NOTE — Progress Notes (Signed)
   Covid-19 Vaccination Clinic  Name:  [name]    MRN: [id number] DOB: 11-23-1946  01/12/2020  [name] was observed post Covid-19 immunization for 15 minutes without incidence. He was provided with Vaccine Information Sheet and instruction to access the V-Safe system.   [name] was instructed to call 911 with any severe reactions post vaccine: [name] Kitchen Difficulty breathing  . Swelling of your face and throat  . A fast heartbeat  . A bad rash all over your body  . Dizziness and weakness    Immunizations Administered    Name Date Dose VIS Date Route   Pfizer COVID-19 Vaccine 01/12/2020  8:27 AM 0.3 mL 11/11/2019 Intramuscular   Manufacturer: [place]   Lot: [id number]   [place]: [id number]

## 2020-02-17 DIAGNOSIS — M8580 Other specified disorders of bone density and structure, unspecified site: Secondary | ICD-10-CM | POA: Diagnosis not present

## 2020-02-17 DIAGNOSIS — C61 Malignant neoplasm of prostate: Secondary | ICD-10-CM | POA: Diagnosis not present

## 2020-04-09 DIAGNOSIS — G894 Chronic pain syndrome: Secondary | ICD-10-CM | POA: Diagnosis not present

## 2020-04-09 DIAGNOSIS — Z79891 Long term (current) use of opiate analgesic: Secondary | ICD-10-CM | POA: Diagnosis not present

## 2020-04-09 DIAGNOSIS — M150 Primary generalized (osteo)arthritis: Secondary | ICD-10-CM | POA: Diagnosis not present

## 2020-04-09 DIAGNOSIS — M40204 Unspecified kyphosis, thoracic region: Secondary | ICD-10-CM | POA: Diagnosis not present

## 2020-04-23 DIAGNOSIS — R351 Nocturia: Secondary | ICD-10-CM | POA: Diagnosis not present

## 2020-04-23 DIAGNOSIS — E291 Testicular hypofunction: Secondary | ICD-10-CM | POA: Diagnosis not present

## 2020-04-23 DIAGNOSIS — R972 Elevated prostate specific antigen [PSA]: Secondary | ICD-10-CM | POA: Diagnosis not present

## 2020-04-23 DIAGNOSIS — N401 Enlarged prostate with lower urinary tract symptoms: Secondary | ICD-10-CM | POA: Diagnosis not present

## 2020-04-23 DIAGNOSIS — M810 Age-related osteoporosis without current pathological fracture: Secondary | ICD-10-CM | POA: Diagnosis not present

## 2020-05-01 ENCOUNTER — Telehealth: Payer: Self-pay | Admitting: Internal Medicine

## 2020-05-01 NOTE — Telephone Encounter (Signed)
Spoke with pt's wife (dpr on file), states that insurance will no longer cover [place], prefers [facility].  Pt would need a 90 day rx sent to [facility] mail order pharmacy.  MW ok to switch to [place], or do you want to initiate a PA for [facility]?  Thanks!

## 2020-05-01 NOTE — Telephone Encounter (Signed)
Completely different device  See if they'll cover symbicort 80 or advair hfa 45 either way rx is 2 q 12 and I prefer symbicort over advair if possible

## 2020-05-02 MED ORDER — BUDESONIDE-FORMOTEROL FUMARATE 80-4.5 MCG/ACT IN AERO: 2.0000 | INHALATION_SPRAY | Freq: Two times a day (BID) | RESPIRATORY_TRACT | 1 refills | Status: DC

## 2020-05-02 NOTE — Telephone Encounter (Signed)
Spoke with pt's wife and advised her of MW recommendations. She agreed and I sent the Rx to [facility]. Nothing further is needed.

## 2020-05-02 NOTE — Telephone Encounter (Signed)
Spoke with patient's wife. She verbalized understanding. She provided me with the patient's insurance information. I was able to find his formulary, Symbicort 80 is covered without a PA.   MW, are you ok with the Symbicort 80 or did you want the 160?

## 2020-05-02 NOTE — Telephone Encounter (Signed)
Yes symb 80 Take 2 puffs first thing in am and then another 2 puffs about 12 hours later.

## 2020-06-11 DIAGNOSIS — K760 Fatty (change of) liver, not elsewhere classified: Secondary | ICD-10-CM | POA: Diagnosis not present

## 2020-06-11 DIAGNOSIS — N3289 Other specified disorders of bladder: Secondary | ICD-10-CM | POA: Diagnosis not present

## 2020-06-11 DIAGNOSIS — K8081 Other cholelithiasis with obstruction: Secondary | ICD-10-CM | POA: Diagnosis not present

## 2020-06-12 ENCOUNTER — Telehealth: Payer: Self-pay | Admitting: Gastroenterology

## 2020-06-12 NOTE — Telephone Encounter (Signed)
Dr. Rush Landmark (DOD)   We received a referral for this pt for abd pain.  He was seen at [facility] and would like to transfer care.  We receivied his records, For your review.  Will you accept this pt?

## 2020-06-13 NOTE — Telephone Encounter (Signed)
I will not be returning to the office until the end of July. I will try to pass by and review notes but it may not be until the last week of July. Thanks. GM

## 2020-06-21 DIAGNOSIS — R1011 Right upper quadrant pain: Secondary | ICD-10-CM | POA: Diagnosis not present

## 2020-06-21 DIAGNOSIS — K8020 Calculus of gallbladder without cholecystitis without obstruction: Secondary | ICD-10-CM | POA: Diagnosis not present

## 2020-06-21 DIAGNOSIS — Z9049 Acquired absence of other specified parts of digestive tract: Secondary | ICD-10-CM | POA: Diagnosis not present

## 2020-07-03 DIAGNOSIS — D649 Anemia, unspecified: Secondary | ICD-10-CM | POA: Diagnosis not present

## 2020-07-03 DIAGNOSIS — J449 Chronic obstructive pulmonary disease, unspecified: Secondary | ICD-10-CM | POA: Diagnosis not present

## 2020-07-03 DIAGNOSIS — Z8719 Personal history of other diseases of the digestive system: Secondary | ICD-10-CM | POA: Diagnosis not present

## 2020-07-03 DIAGNOSIS — E039 Hypothyroidism, unspecified: Secondary | ICD-10-CM | POA: Diagnosis not present

## 2020-07-03 DIAGNOSIS — Z85828 Personal history of other malignant neoplasm of skin: Secondary | ICD-10-CM | POA: Diagnosis not present

## 2020-07-03 DIAGNOSIS — K219 Gastro-esophageal reflux disease without esophagitis: Secondary | ICD-10-CM | POA: Diagnosis not present

## 2020-07-03 DIAGNOSIS — Z9889 Other specified postprocedural states: Secondary | ICD-10-CM | POA: Diagnosis not present

## 2020-07-03 DIAGNOSIS — K5909 Other constipation: Secondary | ICD-10-CM | POA: Diagnosis not present

## 2020-07-03 DIAGNOSIS — D126 Benign neoplasm of colon, unspecified: Secondary | ICD-10-CM | POA: Diagnosis not present

## 2020-07-03 DIAGNOSIS — Z79899 Other long term (current) drug therapy: Secondary | ICD-10-CM | POA: Diagnosis not present

## 2020-07-03 DIAGNOSIS — Z87891 Personal history of nicotine dependence: Secondary | ICD-10-CM | POA: Diagnosis not present

## 2020-07-03 DIAGNOSIS — I10 Essential (primary) hypertension: Secondary | ICD-10-CM | POA: Diagnosis not present

## 2020-07-03 DIAGNOSIS — D509 Iron deficiency anemia, unspecified: Secondary | ICD-10-CM | POA: Diagnosis not present

## 2020-07-03 DIAGNOSIS — R1011 Right upper quadrant pain: Secondary | ICD-10-CM | POA: Diagnosis not present

## 2020-07-16 DIAGNOSIS — K915 Postcholecystectomy syndrome: Secondary | ICD-10-CM | POA: Diagnosis not present

## 2020-07-16 DIAGNOSIS — K912 Postsurgical malabsorption, not elsewhere classified: Secondary | ICD-10-CM | POA: Diagnosis not present

## 2020-08-01 DIAGNOSIS — M40204 Unspecified kyphosis, thoracic region: Secondary | ICD-10-CM | POA: Diagnosis not present

## 2020-08-01 DIAGNOSIS — M150 Primary generalized (osteo)arthritis: Secondary | ICD-10-CM | POA: Diagnosis not present

## 2020-08-01 DIAGNOSIS — G894 Chronic pain syndrome: Secondary | ICD-10-CM | POA: Diagnosis not present

## 2020-08-01 DIAGNOSIS — Z79891 Long term (current) use of opiate analgesic: Secondary | ICD-10-CM | POA: Diagnosis not present

## 2020-08-03 DIAGNOSIS — K912 Postsurgical malabsorption, not elsewhere classified: Secondary | ICD-10-CM | POA: Diagnosis not present

## 2020-08-03 DIAGNOSIS — Z01818 Encounter for other preprocedural examination: Secondary | ICD-10-CM | POA: Diagnosis not present

## 2020-08-03 DIAGNOSIS — K915 Postcholecystectomy syndrome: Secondary | ICD-10-CM | POA: Diagnosis not present

## 2020-08-04 DIAGNOSIS — R001 Bradycardia, unspecified: Secondary | ICD-10-CM | POA: Diagnosis not present

## 2020-08-08 DIAGNOSIS — F329 Major depressive disorder, single episode, unspecified: Secondary | ICD-10-CM | POA: Diagnosis present

## 2020-08-08 DIAGNOSIS — D509 Iron deficiency anemia, unspecified: Secondary | ICD-10-CM | POA: Diagnosis present

## 2020-08-08 DIAGNOSIS — E039 Hypothyroidism, unspecified: Secondary | ICD-10-CM | POA: Diagnosis present

## 2020-08-08 DIAGNOSIS — M25562 Pain in left knee: Secondary | ICD-10-CM | POA: Diagnosis not present

## 2020-08-08 DIAGNOSIS — Z79891 Long term (current) use of opiate analgesic: Secondary | ICD-10-CM | POA: Diagnosis not present

## 2020-08-08 DIAGNOSIS — M25561 Pain in right knee: Secondary | ICD-10-CM | POA: Diagnosis not present

## 2020-08-08 DIAGNOSIS — Z8521 Personal history of malignant neoplasm of larynx: Secondary | ICD-10-CM | POA: Diagnosis not present

## 2020-08-08 DIAGNOSIS — M79642 Pain in left hand: Secondary | ICD-10-CM | POA: Diagnosis not present

## 2020-08-08 DIAGNOSIS — M170 Bilateral primary osteoarthritis of knee: Secondary | ICD-10-CM | POA: Diagnosis present

## 2020-08-08 DIAGNOSIS — Z8719 Personal history of other diseases of the digestive system: Secondary | ICD-10-CM | POA: Diagnosis not present

## 2020-08-08 DIAGNOSIS — M79641 Pain in right hand: Secondary | ICD-10-CM | POA: Diagnosis not present

## 2020-08-08 DIAGNOSIS — F419 Anxiety disorder, unspecified: Secondary | ICD-10-CM | POA: Diagnosis present

## 2020-08-08 DIAGNOSIS — E291 Testicular hypofunction: Secondary | ICD-10-CM | POA: Diagnosis present

## 2020-08-08 DIAGNOSIS — Z87891 Personal history of nicotine dependence: Secondary | ICD-10-CM | POA: Diagnosis not present

## 2020-08-08 DIAGNOSIS — K8010 Calculus of gallbladder with chronic cholecystitis without obstruction: Secondary | ICD-10-CM | POA: Diagnosis present

## 2020-08-08 DIAGNOSIS — G8918 Other acute postprocedural pain: Secondary | ICD-10-CM | POA: Diagnosis not present

## 2020-08-08 DIAGNOSIS — Z923 Personal history of irradiation: Secondary | ICD-10-CM | POA: Diagnosis not present

## 2020-08-08 DIAGNOSIS — I10 Essential (primary) hypertension: Secondary | ICD-10-CM | POA: Diagnosis present

## 2020-08-08 DIAGNOSIS — G8929 Other chronic pain: Secondary | ICD-10-CM | POA: Diagnosis present

## 2020-08-08 DIAGNOSIS — K219 Gastro-esophageal reflux disease without esophagitis: Secondary | ICD-10-CM | POA: Diagnosis present

## 2020-08-08 DIAGNOSIS — K915 Postcholecystectomy syndrome: Secondary | ICD-10-CM | POA: Diagnosis present

## 2020-08-08 DIAGNOSIS — G8928 Other chronic postprocedural pain: Secondary | ICD-10-CM | POA: Diagnosis not present

## 2020-08-08 DIAGNOSIS — J449 Chronic obstructive pulmonary disease, unspecified: Secondary | ICD-10-CM | POA: Diagnosis present

## 2020-08-08 DIAGNOSIS — M810 Age-related osteoporosis without current pathological fracture: Secondary | ICD-10-CM | POA: Diagnosis present

## 2020-08-08 DIAGNOSIS — K660 Peritoneal adhesions (postprocedural) (postinfection): Secondary | ICD-10-CM | POA: Diagnosis present

## 2020-08-14 ENCOUNTER — Ambulatory Visit: Payer: Medicare Other | Admitting: Internal Medicine

## 2020-08-23 ENCOUNTER — Ambulatory Visit: Payer: Medicare Other | Admitting: Internal Medicine

## 2020-08-24 DIAGNOSIS — M810 Age-related osteoporosis without current pathological fracture: Secondary | ICD-10-CM | POA: Diagnosis not present

## 2020-08-24 DIAGNOSIS — C61 Malignant neoplasm of prostate: Secondary | ICD-10-CM | POA: Diagnosis not present

## 2020-08-27 DIAGNOSIS — Z48815 Encounter for surgical aftercare following surgery on the digestive system: Secondary | ICD-10-CM | POA: Diagnosis not present

## 2020-08-27 DIAGNOSIS — Z9049 Acquired absence of other specified parts of digestive tract: Secondary | ICD-10-CM | POA: Diagnosis not present

## 2020-08-29 ENCOUNTER — Ambulatory Visit (INDEPENDENT_AMBULATORY_CARE_PROVIDER_SITE_OTHER): Payer: Medicare Other | Admitting: Internal Medicine

## 2020-08-29 ENCOUNTER — Encounter: Payer: Self-pay | Admitting: Internal Medicine

## 2020-08-29 ENCOUNTER — Other Ambulatory Visit: Payer: Self-pay

## 2020-08-29 DIAGNOSIS — J440 Chronic obstructive pulmonary disease with acute lower respiratory infection: Secondary | ICD-10-CM | POA: Diagnosis not present

## 2020-08-29 NOTE — Assessment & Plan Note (Signed)
Quit smoking 2003 PFTs 09/08/14 min airflow obst which did not meet criteria for copd - 12/08/2013 > try change to dulera 100 2bid> improved - 08/19/2018  After extensive coaching inhaler device,  effectiveness =    90%  - PFT's  08/19/2018  FEV1 3.19 (91 % ) ratio 78  p 8 % improvement from saba p nothing prior to study with DLCO  74 % corrects to 80  % for alv volume    More of AB pattern than copd > Based on two studies from NEJM  378; 20 p 1865 (2018) and 380 : p2020-30 (2019) in pts with mild asthma it is reasonable to use low dose symbicort eg 80 2bid "prn" flare in this setting but I emphasized this was only shown with symbicort and takes advantage of the rapid onset of action but is not the same as "rescue therapy" but can be stopped once the acute symptoms have resolved and the need for rescue has been minimized (< 2 x weekly)    Also rec Pfizer booster is at high risk M /M if infected with Covid 19 based on age and comorbidities.          Each maintenance medication was reviewed in detail including emphasizing most importantly the difference between maintenance and prns and under what circumstances the prns are to be triggered using an action plan format where appropriate.  Total time for H and P, chart review, counseling, teaching device and generating customized AVS unique to this office visit / charting =  12 min

## 2020-08-29 NOTE — Progress Notes (Signed)
Subjective:   Patient ID: [name], male    DOB: 06/08/1946   MRN: [id number]  Brief patient profile:  7 yowm quit smoking 2003 with minimal airflow obst on pfts 09/09/15 c/w GOLD 0 copd previously followed by [name] and repeated pfts 08/19/2018 still GOLD 0     History of Present Illness  07/30/2015 final [name] ov: Chief Complaint  Patient presents with  . 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  Hoarseness with radiation.  Occas chest tightness, but overall feels breathing is doing well. No cough.    Pt dx vocal cord Ca in 04/2015. Pt had more hoarseness.  Now with XRT.  Last Rx coming up.   [name] is ENT.  No LN Stage I .  Small CA seen.  Not much cough  rec You will be referred to the Lung cancer screening program No change in medicaitons Return 4 months with [name]    09/04/2015 acute extended ov/Aella Ronda re: aecopd / early pna on dulera 100 2bid  Chief Complaint  Patient presents with  . Acute Visit    Pt c/o increased cough, chest tightness and night sweats for the past wk.     acutely ill 08/29/15 with vomiting then diffuse bilateral cp / hacking cough  With minimal yellow sputum.  Worse days was 08/30/15 but more sob than baseline and still sever cough and sweats. Not using saba at all rec Augmentin 875 mg take one pill twice daily  X 10 days -  Prednisone 10 mg take  4 each am x 2 days,   2 each am x 2 days,  1 each am x 2 days and stop     10/16/2015  f/u ov/Quinterrius Errington re: COPD GOLD 0/ f/u lung nodule maint on dulera 100 2bid  Chief Complaint  Patient presents with  . Follow-up    Pt states that his cough has resolved. He has not needed albuterol.  No new co's today.   Not limited by breathing from desired activities  rec  When not coughing ok to leave the pm Zantac and after a few weeks if not coughing stop the protonix and take zantac after bfast x 2 weeks  At first onset of cough for any reason > Pantoprazole (protonix) 40 mg   Take  30-60  min before first meal of the day and Zantac 150 mg  one @  bedtime until return to office - this is the best way to tell whether stomach acid is contributing to your problem.      08/19/2018  f/u ov/Knoah Nedeau re:  Copd 0/ ab on dulera s/p aecopd seen by NP / maint on ppi q am /h2 hs and dulera 100 2bid  Chief Complaint  Patient presents with  . Follow-up    Former PW pt. PFT completed today, 08/19/18.  Pt last seen in 05/2018 by [name], NP. Pt states he feels he is at baseline. Pt denies DOE, no cough. Pt states he rarely uses his albuterol inhaler. Pt denies any concerns at this time.   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 Follow-up    Breathing is overall doing well. He mainly has issues with breathing when he bends over. He does not have a rescue inhaler.    Dyspnea:  Still able to walk neighorhood up 30 min, flat grade avg pace Cough: none Sleeping: bed is flat, on side, 1 pillow doubled over  SABA use: no 02: none No change rx   05/01/20  Insurance req:  Changed dulera to symbicort 80 2 q am and pm doses prn   08/29/2020  f/u ov/Matheus Spiker re:   AB  symbicort 2 pff in am  - not needing pm doses  Chief Complaint  Patient presents with  . Follow-up   Dyspnea:  No trouble walking the neighborhood  Cough: none Sleeping:  Bed is flat/ one pillow doubled over  SABA use: none 02: none    No obvious day to day or daytime variability or assoc excess/ purulent sputum or mucus plugs or hemoptysis or cp or chest tightness, subjective wheeze or overt sinus or hb symptoms.   Sleeping  without nocturnal  or early am exacerbation  of respiratory  c/o's or  need for noct saba. Also denies any obvious fluctuation of symptoms with weather or environmental changes or other aggravating or alleviating factors except as outlined above   No unusual exposure hx or h/o childhood pna/ asthma or knowledge of premature birth.  Current Allergies, Complete Past Medical History, Past Surgical History, Family History, and Social History were reviewed in [employer] record.  ROS  The following are not active complaints unless bolded Hoarseness, sore throat, dysphagia, dental problems, itching, sneezing,  nasal congestion or discharge of excess mucus or purulent secretions, ear ache,   fever, chills, sweats, unintended wt loss or wt gain, classically pleuritic or exertional cp,  orthopnea pnd or arm/hand swelling  or leg swelling, presyncope, palpitations, abdominal pain, anorexia, nausea, vomiting, diarrhea  or change in bowel habits or change in bladder habits, change in stools or change in urine, dysuria, hematuria,  rash, arthralgias, visual complaints, headache, numbness, weakness or ataxia or problems with walking or coordination,  change in mood or  memory.        Current Meds  Medication Sig  . amLODipine (NORVASC) 10 MG tablet Take 10 mg by mouth daily.  . budesonide-formoterol (SYMBICORT) 80-4.5 MCG/ACT inhaler Inhale 2 puffs into the lungs in the morning and at bedtime.  . DULoxetine (CYMBALTA) 30 MG capsule Take 90 mg by mouth daily.   [name] Kitchen levothyroxine (SYNTHROID, LEVOTHROID) 200 MCG tablet Take 200 mcg by mouth daily before breakfast.   . oxyCODONE-acetaminophen (PERCOCET) 10-325 MG tablet Take 2 tablets by mouth 4 (four) times daily.  . pantoprazole (PROTONIX) 20 MG tablet Take 20 mg by mouth daily.  [name] Kitchen testosterone cypionate (DEPOTESTOTERONE CYPIONATE) 200 MG/ML injection Inject 100 mg into the muscle once a week.   . Zoledronic Acid (ZOMETA IV) Inject into the vein every 3 (three) months.                  Objective:    Physical Exam  Very pleasant amb wm nad   08/29/2020        194 08/23/2019        223  08/19/2018        213  10/16/2015      191   09/04/15 188 lb (  85.276 kg)  09/04/15 187 lb 9.6 oz (85.095 kg)  07/30/15 187 lb 4.8 oz (84.959 kg)     Vital signs reviewed  08/29/2020  - Note at rest 02 sats  96% on RA     HEENT : pt wearing mask not removed for exam due to covid - 19 concerns.   NECK :  without JVD/Nodes/TM/ nl carotid upstrokes bilaterally   LUNGS: no acc muscle use,  Min barrel  contour chest wall with bilateral  slightly decreased bs s audible wheeze and  without cough on insp or exp maneuvers and min  Hyperresonant  to  percussion bilaterally     CV:  RRR  no s3 or murmur or increase in P2, and no edema   ABD:  soft and nontender with pos end  insp Hoover's  in the supine position. No bruits or organomegaly appreciated, bowel sounds nl  MS:   Nl gait/  ext warm without deformities, calf tenderness, cyanosis or clubbing No obvious joint restrictions   SKIN: warm and dry without lesions    NEURO:  alert, approp, nl sensorium with  no motor or cerebellar deficits apparent.            Assessment & Plan:

## 2020-08-29 NOTE — Patient Instructions (Signed)
No change in recommendations  Please schedule a follow up visit in 12 months but call sooner if needed  

## 2020-09-18 DIAGNOSIS — Z23 Encounter for immunization: Secondary | ICD-10-CM | POA: Diagnosis not present

## 2020-09-20 DIAGNOSIS — K219 Gastro-esophageal reflux disease without esophagitis: Secondary | ICD-10-CM | POA: Diagnosis not present

## 2020-09-20 DIAGNOSIS — D509 Iron deficiency anemia, unspecified: Secondary | ICD-10-CM | POA: Diagnosis not present

## 2020-09-26 DIAGNOSIS — G894 Chronic pain syndrome: Secondary | ICD-10-CM | POA: Diagnosis not present

## 2020-09-26 DIAGNOSIS — M150 Primary generalized (osteo)arthritis: Secondary | ICD-10-CM | POA: Diagnosis not present

## 2020-09-26 DIAGNOSIS — Z79891 Long term (current) use of opiate analgesic: Secondary | ICD-10-CM | POA: Diagnosis not present

## 2020-09-26 DIAGNOSIS — M40204 Unspecified kyphosis, thoracic region: Secondary | ICD-10-CM | POA: Diagnosis not present

## 2020-10-02 DIAGNOSIS — L218 Other seborrheic dermatitis: Secondary | ICD-10-CM | POA: Diagnosis not present

## 2020-10-02 DIAGNOSIS — L570 Actinic keratosis: Secondary | ICD-10-CM | POA: Diagnosis not present

## 2020-10-02 DIAGNOSIS — L308 Other specified dermatitis: Secondary | ICD-10-CM | POA: Diagnosis not present

## 2020-10-02 DIAGNOSIS — X32XXXA Exposure to sunlight, initial encounter: Secondary | ICD-10-CM | POA: Diagnosis not present

## 2020-10-02 DIAGNOSIS — L820 Inflamed seborrheic keratosis: Secondary | ICD-10-CM | POA: Diagnosis not present

## 2020-10-02 DIAGNOSIS — C44319 Basal cell carcinoma of skin of other parts of face: Secondary | ICD-10-CM | POA: Diagnosis not present

## 2020-10-23 DIAGNOSIS — R634 Abnormal weight loss: Secondary | ICD-10-CM | POA: Diagnosis not present

## 2020-10-23 DIAGNOSIS — K219 Gastro-esophageal reflux disease without esophagitis: Secondary | ICD-10-CM | POA: Diagnosis not present

## 2020-10-23 DIAGNOSIS — R197 Diarrhea, unspecified: Secondary | ICD-10-CM | POA: Diagnosis not present

## 2020-10-23 DIAGNOSIS — Z9049 Acquired absence of other specified parts of digestive tract: Secondary | ICD-10-CM | POA: Diagnosis not present

## 2020-10-23 DIAGNOSIS — Z8521 Personal history of malignant neoplasm of larynx: Secondary | ICD-10-CM | POA: Diagnosis not present

## 2020-10-23 DIAGNOSIS — Z87891 Personal history of nicotine dependence: Secondary | ICD-10-CM | POA: Diagnosis not present

## 2020-10-23 DIAGNOSIS — I10 Essential (primary) hypertension: Secondary | ICD-10-CM | POA: Diagnosis not present

## 2020-10-23 DIAGNOSIS — D696 Thrombocytopenia, unspecified: Secondary | ICD-10-CM | POA: Diagnosis not present

## 2020-10-23 DIAGNOSIS — Z8719 Personal history of other diseases of the digestive system: Secondary | ICD-10-CM | POA: Diagnosis not present

## 2020-10-23 DIAGNOSIS — E039 Hypothyroidism, unspecified: Secondary | ICD-10-CM | POA: Diagnosis not present

## 2020-10-23 DIAGNOSIS — D509 Iron deficiency anemia, unspecified: Secondary | ICD-10-CM | POA: Diagnosis not present

## 2020-10-23 DIAGNOSIS — Z6825 Body mass index (BMI) 25.0-25.9, adult: Secondary | ICD-10-CM | POA: Diagnosis not present

## 2020-10-23 DIAGNOSIS — J449 Chronic obstructive pulmonary disease, unspecified: Secondary | ICD-10-CM | POA: Diagnosis not present

## 2020-10-23 DIAGNOSIS — F32A Depression, unspecified: Secondary | ICD-10-CM | POA: Diagnosis not present

## 2020-10-26 DIAGNOSIS — K5730 Diverticulosis of large intestine without perforation or abscess without bleeding: Secondary | ICD-10-CM | POA: Diagnosis not present

## 2020-10-26 DIAGNOSIS — D509 Iron deficiency anemia, unspecified: Secondary | ICD-10-CM | POA: Diagnosis not present

## 2020-10-26 DIAGNOSIS — D696 Thrombocytopenia, unspecified: Secondary | ICD-10-CM | POA: Diagnosis not present

## 2020-10-26 DIAGNOSIS — R197 Diarrhea, unspecified: Secondary | ICD-10-CM | POA: Diagnosis not present

## 2020-10-26 DIAGNOSIS — R234 Changes in skin texture: Secondary | ICD-10-CM | POA: Diagnosis not present

## 2020-10-26 DIAGNOSIS — R634 Abnormal weight loss: Secondary | ICD-10-CM | POA: Diagnosis not present

## 2020-11-06 DIAGNOSIS — X32XXXD Exposure to sunlight, subsequent encounter: Secondary | ICD-10-CM | POA: Diagnosis not present

## 2020-11-06 DIAGNOSIS — L570 Actinic keratosis: Secondary | ICD-10-CM | POA: Diagnosis not present

## 2020-11-06 DIAGNOSIS — D509 Iron deficiency anemia, unspecified: Secondary | ICD-10-CM | POA: Diagnosis not present

## 2020-11-06 DIAGNOSIS — Z85828 Personal history of other malignant neoplasm of skin: Secondary | ICD-10-CM | POA: Diagnosis not present

## 2020-11-06 DIAGNOSIS — Z08 Encounter for follow-up examination after completed treatment for malignant neoplasm: Secondary | ICD-10-CM | POA: Diagnosis not present

## 2020-11-06 DIAGNOSIS — R197 Diarrhea, unspecified: Secondary | ICD-10-CM | POA: Diagnosis not present

## 2020-11-06 DIAGNOSIS — R634 Abnormal weight loss: Secondary | ICD-10-CM | POA: Diagnosis not present

## 2020-11-16 DIAGNOSIS — M810 Age-related osteoporosis without current pathological fracture: Secondary | ICD-10-CM | POA: Diagnosis not present

## 2020-11-16 DIAGNOSIS — C61 Malignant neoplasm of prostate: Secondary | ICD-10-CM | POA: Diagnosis not present

## 2020-11-21 DIAGNOSIS — G894 Chronic pain syndrome: Secondary | ICD-10-CM | POA: Diagnosis not present

## 2020-11-21 DIAGNOSIS — M40204 Unspecified kyphosis, thoracic region: Secondary | ICD-10-CM | POA: Diagnosis not present

## 2020-11-21 DIAGNOSIS — M150 Primary generalized (osteo)arthritis: Secondary | ICD-10-CM | POA: Diagnosis not present

## 2020-11-21 DIAGNOSIS — Z79891 Long term (current) use of opiate analgesic: Secondary | ICD-10-CM | POA: Diagnosis not present

## 2021-06-12 NOTE — Telephone Encounter (Signed)
Records shredded since patient is established with DH.

## 2021-08-29 ENCOUNTER — Other Ambulatory Visit: Payer: Self-pay

## 2021-08-29 ENCOUNTER — Ambulatory Visit (INDEPENDENT_AMBULATORY_CARE_PROVIDER_SITE_OTHER): Payer: BLUE CROSS/BLUE SHIELD | Admitting: Internal Medicine

## 2021-08-29 ENCOUNTER — Encounter: Payer: Self-pay | Admitting: Internal Medicine

## 2021-08-29 VITALS — BP 126/84 | HR 82 | Temp 98.4°F | Ht 73.0 in | Wt 200.4 lb

## 2021-08-29 DIAGNOSIS — J440 Chronic obstructive pulmonary disease with acute lower respiratory infection: Secondary | ICD-10-CM | POA: Diagnosis not present

## 2021-08-29 DIAGNOSIS — Z23 Encounter for immunization: Secondary | ICD-10-CM | POA: Diagnosis not present

## 2021-08-29 MED ORDER — BUDESONIDE-FORMOTEROL FUMARATE 80-4.5 MCG/ACT IN AERO: 2.0000 | INHALATION_SPRAY | Freq: Two times a day (BID) | RESPIRATORY_TRACT | 3 refills | Status: AC

## 2021-08-29 NOTE — Progress Notes (Signed)
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  Hoarseness with radiation.  Occas chest tightness, but overall feels breathing is doing well. No cough.    Pt dx vocal cord Ca in 04/2015. Pt had more hoarseness.  Now with XRT.  Last Rx coming up.   [name] is ENT.  No LN Stage I .  Small CA seen.  Not much cough  rec You will be referred to the Lung cancer screening program No change in medicaitons Return 4 months with [name]    10/16/2015  f/u ov/[name] re: COPD GOLD 0/ f/u lung nodule maint on dulera 100 2bid  Chief Complaint  Patient presents with   Follow-up    Pt states that his cough has resolved. He has not needed albuterol.  No new co's today.   Not limited by breathing from desired activities  rec  When not coughing ok to leave the pm Zantac and after a few weeks if not coughing stop the protonix and take zantac after bfast x 2 weeks  At first onset of cough for any reason > Pantoprazole (protonix) 40 mg   Take  30-60 min before first meal of the day and Zantac 150 mg  one @  bedtime until return to office - this is the best way to tell whether stomach acid is contributing to your problem.      08/19/2018  f/u ov/[name] re:  Copd 0/ ab on dulera s/p aecopd seen by NP / maint on ppi q am /h2 hs and dulera 100 2bid  Chief Complaint  Patient presents with   Follow-up    Former PW pt. PFT completed today, 08/19/18.  Pt last seen in 05/2018 by [name], NP. Pt states he feels he is at baseline. Pt denies DOE, no cough. Pt states he rarely uses his albuterol inhaler. Pt denies any concerns at this time.    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 He is on pred 20 mg per Dr Tamala Julian. He does not have a rescue inhaler.   Dyspnea:  walking neighborhood x 30 min x 3-4 x per week Cough: none Sleeping: flat bed / 2 pillows  SABA use: not needing  02: none  Covid status:  vax x 4    No obvious day  to day or daytime variability or assoc excess/ purulent sputum or mucus plugs or hemoptysis or cp or chest tightness, subjective wheeze or overt sinus or hb symptoms.   Sleeping  without nocturnal  or early am exacerbation  of respiratory  c/o's or need for noct saba. Also denies any obvious fluctuation of symptoms with weather or environmental changes or other aggravating or alleviating factors except as outlined above   No unusual exposure hx or h/o childhood pna/ asthma or knowledge of premature birth.  Current Allergies, Complete Past Medical History, Past Surgical History, Family History, and Social History were reviewed in [employer] record.  ROS  The following are not active complaints unless bolded Hoarseness, sore throat, dysphagia, dental problems, itching, sneezing,  nasal congestion or discharge of excess mucus or purulent secretions, ear ache,   fever, chills, sweats, unintended wt loss or wt gain, classically pleuritic or exertional cp,  orthopnea pnd or arm/hand swelling  or leg swelling, presyncope, palpitations, abdominal pain,  anorexia, nausea, vomiting, diarrhea  or change in bowel habits or change in bladder habits, change in stools or change in urine, dysuria, hematuria,  rash, arthralgias, visual complaints, headache, numbness, weakness or ataxia or problems with walking or coordination,  change in mood or  memory.        Current Meds  Medication Sig   amLODipine (NORVASC) 10 MG tablet Take 10 mg by mouth daily.   budesonide-formoterol (SYMBICORT) 80-4.5 MCG/ACT inhaler Inhale 2 puffs into the lungs in the morning and at bedtime.   colestipol (COLESTID) 1 g tablet Take 1 g by mouth 2 (two) times daily.   denosumab (PROLIA) 60 MG/ML SOSY injection Inject 60 mg into the skin every 6 (six) months.   DULoxetine (CYMBALTA) 30 MG capsule Take 90 mg by mouth daily.    levothyroxine (SYNTHROID, LEVOTHROID) 200 MCG tablet Take 200 mcg by mouth daily before breakfast.    oxyCODONE-acetaminophen (PERCOCET) 10-325 MG tablet Take 2 tablets by mouth 4 (four) times daily.   pantoprazole (PROTONIX) 20 MG tablet Take 20 mg by mouth daily.   predniSONE (DELTASONE) 20 MG tablet Take 20 mg by mouth daily with breakfast. Per Dr Tamala Julian- arthritis   testosterone cypionate (DEPOTESTOTERONE CYPIONATE) 200 MG/ML injection Inject 100 mg into the muscle once a week.               Objective:   Physical Exam     08/29/2021        200  08/29/2020        194 08/23/2019        223  08/19/2018        213  10/16/2015      191   09/04/15 188 lb (85.276 kg)  09/04/15 187 lb 9.6 oz (85.095 kg)  07/30/15 187 lb 4.8 oz (84.959 kg)      Vital signs reviewed  08/29/2021  - Note at rest 02 sats  96% on RA   General appearance:    amb pleasant  wm nad   HEENT : pt wearing mask not removed for exam due to covid - 19 concerns.   NECK :  without JVD/Nodes/TM/ nl carotid upstrokes bilaterally   LUNGS: no acc muscle use,  Min barrel  contour chest wall with bilateral  slightly decreased bs s audible wheeze and  without cough on insp or exp  maneuvers and min  Hyperresonant  to  percussion bilaterally     CV:  RRR  no s3  or murmur or increase in P2, and no edema   ABD:  soft and nontender with pos end  insp Hoover's  in the supine position. No bruits or organomegaly appreciated, bowel sounds nl  MS:   Nl gait/  ext warm without deformities, calf tenderness, cyanosis or clubbing No obvious joint restrictions   SKIN: warm and dry without lesions    NEURO:  alert, approp, nl sensorium with  no motor or cerebellar deficits apparent.          Assessment & Plan:

## 2021-08-29 NOTE — Patient Instructions (Signed)
No change in medications   Please schedule a follow up visit in 12  months but call sooner if needed  

## 2021-08-29 NOTE — Addendum Note (Signed)
Addended by: Rosana Berger on: 08/29/2021 09:16 AM   Modules accepted: Orders

## 2021-08-29 NOTE — Assessment & Plan Note (Signed)
Quit smoking 2003 PFTs 09/08/14 min airflow obst which did not meet criteria for copd - 12/08/2013 > try change to dulera 100 2bid> improved - 08/19/2018  After extensive coaching inhaler device,  effectiveness =    90%  - PFT's  08/19/2018  FEV1 3.19 (91 % ) ratio 78  p 8 % improvement from saba p nothing prior to study with DLCO  74 % corrects to 80  % for alv volume    AB good control on symb 80 2 q am   Based on two studies from NEJM  378; 20 p 1865 (2018) and 380 : p2020-30 (2019) in pts with mild asthma it is reasonable to use low dose symbicort eg 80 2bid "prn" flare in this setting but I emphasized this was only shown with symbicort and takes advantage of the rapid onset of action but is not the same as "rescue therapy" but can be stopped once the acute symptoms have resolved and the need for rescue has been minimized (< 2 x weekly)    - The proper method of use, as well as anticipated side effects, of a metered-dose inhaler were discussed and demonstrated to the patient using teach back method.  >>> f/u q 12 m, sooner if needed          Each maintenance medication was reviewed in detail including emphasizing most importantly the difference between maintenance and prns and under what circumstances the prns are to be triggered using an action plan format where appropriate.  Total time for H and P, chart review, counseling, reviewing hfa device(s) and generating customized AVS unique to this office visit / same day charting = 23 min

## 2022-05-13 ENCOUNTER — Other Ambulatory Visit (HOSPITAL_BASED_OUTPATIENT_CLINIC_OR_DEPARTMENT_OTHER): Payer: Self-pay

## 2022-05-13 MED ORDER — OXYCODONE-ACETAMINOPHEN 10-325 MG PO TABS
ORAL_TABLET | ORAL | 0 refills | Status: AC
  Filled 2022-05-13: qty 180, 30d supply, fill #0

## 2022-07-01 DEATH — deceased

## 2022-08-29 ENCOUNTER — Ambulatory Visit: Payer: BLUE CROSS/BLUE SHIELD | Admitting: Internal Medicine
